# Patient Record
Sex: Female | Born: 1980
Health system: Southern US, Community
[De-identification: ages and names within clinical notes are randomized; demographics above are authoritative.]

## PROBLEM LIST (undated history)

## (undated) DIAGNOSIS — E119 Type 2 diabetes mellitus without complications: Secondary | ICD-10-CM

## (undated) DIAGNOSIS — H409 Unspecified glaucoma: Secondary | ICD-10-CM

## (undated) DIAGNOSIS — N186 End stage renal disease: Secondary | ICD-10-CM

## (undated) DIAGNOSIS — E785 Hyperlipidemia, unspecified: Secondary | ICD-10-CM

## (undated) DIAGNOSIS — F32A Depression, unspecified: Secondary | ICD-10-CM

## (undated) DIAGNOSIS — D649 Anemia, unspecified: Secondary | ICD-10-CM

## (undated) DIAGNOSIS — F419 Anxiety disorder, unspecified: Secondary | ICD-10-CM

## (undated) DIAGNOSIS — I10 Essential (primary) hypertension: Secondary | ICD-10-CM

## (undated) HISTORY — PX: OTHER SURGICAL HISTORY: SHX169

## (undated) HISTORY — DX: Unspecified glaucoma: H40.9

## (undated) HISTORY — DX: Type 2 diabetes mellitus without complications: E11.9

## (undated) HISTORY — PX: ANKLE SURGERY: SHX546

## (undated) HISTORY — PX: FOOT SURGERY: SHX648

## (undated) HISTORY — DX: Essential (primary) hypertension: I10

## (undated) HISTORY — DX: Hyperlipidemia, unspecified: E78.5

---

## 2008-01-17 ENCOUNTER — Ambulatory Visit (HOSPITAL_COMMUNITY): Admission: RE | Admit: 2008-01-17 | Discharge: 2008-01-17 | Payer: Self-pay | Admitting: Obstetrics and Gynecology

## 2008-02-16 ENCOUNTER — Ambulatory Visit (HOSPITAL_COMMUNITY): Admission: RE | Admit: 2008-02-16 | Discharge: 2008-02-16 | Payer: Self-pay | Admitting: Obstetrics and Gynecology

## 2010-03-22 ENCOUNTER — Encounter: Payer: Self-pay | Admitting: Obstetrics and Gynecology

## 2013-05-22 ENCOUNTER — Other Ambulatory Visit (HOSPITAL_COMMUNITY): Payer: Self-pay | Admitting: Podiatry

## 2013-05-22 DIAGNOSIS — M79606 Pain in leg, unspecified: Secondary | ICD-10-CM

## 2013-05-22 DIAGNOSIS — S92902A Unspecified fracture of left foot, initial encounter for closed fracture: Secondary | ICD-10-CM

## 2013-05-23 ENCOUNTER — Ambulatory Visit (HOSPITAL_COMMUNITY)
Admission: RE | Admit: 2013-05-23 | Discharge: 2013-05-23 | Disposition: A | Discharge status: Home / Self Care | Payer: BC Managed Care – PPO | Source: Ambulatory Visit | Attending: Podiatry | Admitting: Podiatry

## 2013-05-23 DIAGNOSIS — M79609 Pain in unspecified limb: Secondary | ICD-10-CM | POA: Insufficient documentation

## 2013-05-23 DIAGNOSIS — M79606 Pain in leg, unspecified: Secondary | ICD-10-CM

## 2013-05-23 DIAGNOSIS — M7989 Other specified soft tissue disorders: Secondary | ICD-10-CM | POA: Insufficient documentation

## 2013-05-28 ENCOUNTER — Encounter (HOSPITAL_COMMUNITY)
Admission: RE | Admit: 2013-05-28 | Discharge: 2013-05-28 | Disposition: A | Discharge status: Home / Self Care | Payer: BC Managed Care – PPO | Source: Ambulatory Visit | Attending: Podiatry | Admitting: Podiatry

## 2013-05-28 ENCOUNTER — Encounter (HOSPITAL_COMMUNITY): Payer: Self-pay

## 2013-05-28 DIAGNOSIS — S92902A Unspecified fracture of left foot, initial encounter for closed fracture: Secondary | ICD-10-CM

## 2013-05-28 DIAGNOSIS — M7989 Other specified soft tissue disorders: Secondary | ICD-10-CM | POA: Insufficient documentation

## 2013-05-28 DIAGNOSIS — M79609 Pain in unspecified limb: Secondary | ICD-10-CM | POA: Insufficient documentation

## 2013-05-28 MED ORDER — TECHNETIUM TC 99M MEDRONATE IV KIT
25.0000 | PACK | Freq: Once | INTRAVENOUS | Status: AC | PRN
  Administered 2013-05-28: 25 via INTRAVENOUS

## 2013-05-29 ENCOUNTER — Other Ambulatory Visit (HOSPITAL_COMMUNITY): Payer: Self-pay | Admitting: Podiatry

## 2013-05-29 DIAGNOSIS — M7989 Other specified soft tissue disorders: Secondary | ICD-10-CM

## 2013-05-31 ENCOUNTER — Ambulatory Visit (HOSPITAL_COMMUNITY)
Admission: RE | Admit: 2013-05-31 | Discharge: 2013-05-31 | Disposition: A | Discharge status: Home / Self Care | Payer: BC Managed Care – PPO | Source: Ambulatory Visit | Attending: Podiatry | Admitting: Podiatry

## 2013-05-31 DIAGNOSIS — M7989 Other specified soft tissue disorders: Secondary | ICD-10-CM

## 2013-05-31 DIAGNOSIS — S92309A Fracture of unspecified metatarsal bone(s), unspecified foot, initial encounter for closed fracture: Secondary | ICD-10-CM | POA: Insufficient documentation

## 2013-05-31 DIAGNOSIS — X58XXXA Exposure to other specified factors, initial encounter: Secondary | ICD-10-CM | POA: Insufficient documentation

## 2013-05-31 DIAGNOSIS — R937 Abnormal findings on diagnostic imaging of other parts of musculoskeletal system: Secondary | ICD-10-CM | POA: Insufficient documentation

## 2013-06-07 ENCOUNTER — Other Ambulatory Visit: Payer: Self-pay | Admitting: Surgery

## 2013-06-07 DIAGNOSIS — M7989 Other specified soft tissue disorders: Secondary | ICD-10-CM

## 2013-06-07 DIAGNOSIS — M79609 Pain in unspecified limb: Secondary | ICD-10-CM

## 2013-06-13 ENCOUNTER — Encounter: Payer: Self-pay | Admitting: Surgery

## 2013-07-06 ENCOUNTER — Encounter: Payer: Self-pay | Admitting: Surgery

## 2013-07-09 ENCOUNTER — Encounter (HOSPITAL_COMMUNITY): Payer: BC Managed Care – PPO

## 2013-07-09 ENCOUNTER — Encounter: Payer: BC Managed Care – PPO | Admitting: Surgery

## 2013-08-13 ENCOUNTER — Encounter: Payer: Self-pay | Admitting: Physical Medicine & Rehabilitation

## 2013-10-19 ENCOUNTER — Ambulatory Visit: Payer: BC Managed Care – PPO | Admitting: Physical Medicine & Rehabilitation

## 2014-03-15 DIAGNOSIS — E11610 Type 2 diabetes mellitus with diabetic neuropathic arthropathy: Secondary | ICD-10-CM | POA: Insufficient documentation

## 2014-12-24 ENCOUNTER — Ambulatory Visit: Payer: BLUE CROSS/BLUE SHIELD | Attending: Podiatry | Admitting: Physical Therapy

## 2014-12-24 DIAGNOSIS — R29898 Other symptoms and signs involving the musculoskeletal system: Secondary | ICD-10-CM | POA: Diagnosis not present

## 2014-12-24 NOTE — Therapy (Signed)
[facility] [place], [place], [address] Phone: [phone]   Fax:  [phone]  Physical Therapy Evaluation  Patient Details  Name: Sally Porter MRN: [id number] Date of Birth: 1980/11/12 Referring Provider: Erline Hau MD.  Encounter Date: 12/24/2014    Past Medical History  Diagnosis Date  . Diabetes mellitus without complication   . Glaucoma     No past surgical history on file.  There were no vitals filed for this visit.  Visit Diagnosis:  Weakness of both lower extremities - Plan: PT plan of care cert/re-cert      Subjective Assessment - 12/24/14 1246    Subjective Doctor wants me to get stronger and walk with my walker.   Patient Stated Goals To be independent again.            [facility] PT Assessment - 12/24/14 0001    Assessment   Medical Diagnosis LE weakness.   Referring Provider Erline Hau MD.   Onset Date/Surgical Date --  April 22, 2013.   Precautions   Precaution Comments Patient wears bilateral boots and is only allowed to weight bear (with W) with them donned.   Restrictions   Weight Bearing Restrictions --  WBAT with walker with bilateral boots donned.   Balance Screen   Has the patient fallen in the past 6 months No   Has the patient had a decrease in activity level because of a fear of falling?  Yes   Is the patient reluctant to leave their home because of a fear of falling?  No   Observation/Other Assessments   Observations When patient donned bilateral boots she had significant edema over bilateral ankles and feet (could be observed through socks).     ROM / Strength   AROM / PROM / Strength AROM;Strength   AROM   Overall AROM Comments As patient mentioned in the history intake she has very little ankle movement bilaterally (no more than 10 degrees moving into dorsi/plantarflexion).   Strength   Overall Strength Comments Bilateral hip flexion and abduction and knee  flexipon and extension= 4- to 4/5.   Ambulation/Gait   Gait Comments The patient ambulated 25 feet in clinic today with a FWW with bilateral boots donned and CGA.                             PT Short Term Goals - 12/24/14 1249    PT SHORT TERM GOAL #1   Title Ind with an HEP.   Time 3   Period Weeks   Status New           PT Long Term Goals - 12/24/14 1250    PT LONG TERM GOAL #1   Title Patient walk in clinic with a FWW 1,000 feet.   Time 8   Status New               Plan - 12/24/14 0905    Clinical Impression Statement On April 22, 2013 was at work and rolled over left ankle.  To ED for X-ray.  Left ankle swollen so and they thought I had a bloodclot.  Got a CT scan scan and found out I had a fracture foot.  Bone stimulator was working as expected so in November patient had an external fixator put on and wore fixator until January 2016.  Then second surgery to fuse left foot.  Then in April 2016 was on walker  and hopping on right foot and  right ankle fractured. and then had to have an ORIF on right ankle and also had external fixator on left ankle removed in April as well. Both feet/ankles were casted at that time.  In wheelchair at this time.  Resting pain-level is a 5-6/10 and with standing pain rises to a 7-8/10.  Patient has has low back pain due to all of this and is seeing pain mangement MD..  When out of her boots she moves her ankles though her motion is very limited.   Pt will benefit from skilled therapeutic intervention in order to improve on the following deficits Pain;Decreased activity tolerance   Rehab Potential Good   PT Frequency 2x / week   PT Duration 8 weeks   PT Treatment/Interventions Therapeutic exercise   Consulted and Agree with Plan of Care Patient         Problem List There are no active problems to display for this patient.   Camdan Burdi, [place] MPT 12/24/2014, 12:59 PM  [facility] [address] [place], [place], [address] Phone: [phone]   Fax:  [phone]  Name: Sally Porter MRN: [id number] Date of Birth: 1980/04/22

## 2014-12-26 ENCOUNTER — Ambulatory Visit: Payer: BLUE CROSS/BLUE SHIELD | Admitting: Physical Therapy

## 2014-12-26 DIAGNOSIS — R29898 Other symptoms and signs involving the musculoskeletal system: Secondary | ICD-10-CM

## 2014-12-26 NOTE — Therapy (Signed)
[facility] [place], [place], [address] Phone: [phone]   Fax:  [phone]  Physical Therapy Treatment  Patient Details  Name: Sally Porter MRN: [id number] Date of Birth: Jul 13, 1980 Referring Provider: Erline Hau MD.  Encounter Date: 12/26/2014      PT End of Session - 12/26/14 1730    Visit Number 2   Number of Visits 16   Date for PT Re-Evaluation 02/20/15   PT Start Time 0446   PT Stop Time 0523   PT Time Calculation (min) 37 min   Activity Tolerance Patient tolerated treatment well   Behavior During Therapy [facility] for tasks assessed/performed      Past Medical History  Diagnosis Date  . Diabetes mellitus without complication   . Glaucoma     No past surgical history on file.  There were no vitals filed for this visit.  Visit Diagnosis:  Weakness of both lower extremities                                 PT Short Term Goals - 12/24/14 1249    PT SHORT TERM GOAL #1   Title Ind with an HEP.   Time 3   Period Weeks   Status New           PT Long Term Goals - 12/24/14 1250    PT LONG TERM GOAL #1   Title Patient walk in clinic with a FWW 1,000 feet.   Time 8   Status New     Treatment:  Nustep level 4 x 20 minutes gait with FWW 100 feet.  Sent letter to MD regarding possible ROM/STR of bil ankle out of boots in [place] fashion.  Patient states she would bring letter back her next visit.          Problem List There are no active problems to display for this patient.   APPLEGATE, [place] MPT 12/26/2014, 5:43 PM  [facility] [address] [place], [place], [address] Phone: [phone]   Fax:  [phone]  Name: Sally Porter MRN: [id number] Date of Birth: [birth year]

## 2014-12-30 ENCOUNTER — Encounter: Payer: BLUE CROSS/BLUE SHIELD | Admitting: Physical Therapy

## 2015-01-02 ENCOUNTER — Encounter: Payer: BLUE CROSS/BLUE SHIELD | Admitting: Physical Therapy

## 2015-01-07 ENCOUNTER — Encounter: Payer: BLUE CROSS/BLUE SHIELD | Admitting: Physical Therapy

## 2015-01-09 ENCOUNTER — Encounter: Payer: BLUE CROSS/BLUE SHIELD | Admitting: Physical Therapy

## 2015-02-20 IMAGING — US US EXTREM LOW VENOUS*L*
1 series · 14 of 24 positions shown · non-contrast
Comparison: None.

CLINICAL DATA: Left leg pain and swelling.

EXAM:
Left LOWER EXTREMITY VENOUS DOPPLER ULTRASOUND
TECHNIQUE: Gray-scale sonography with graded compression, as well as color
Doppler and duplex ultrasound, were performed to evaluate the deep
venous system from the level of the common femoral vein through the
popliteal and proximal calf veins. Spectral Doppler was utilized to
evaluate flow at rest and with distal augmentation maneuvers.

[Series 1: us extrem low venous*left* · 0.09mm/px · 14 of 40 slices shown]
[im 1/40]
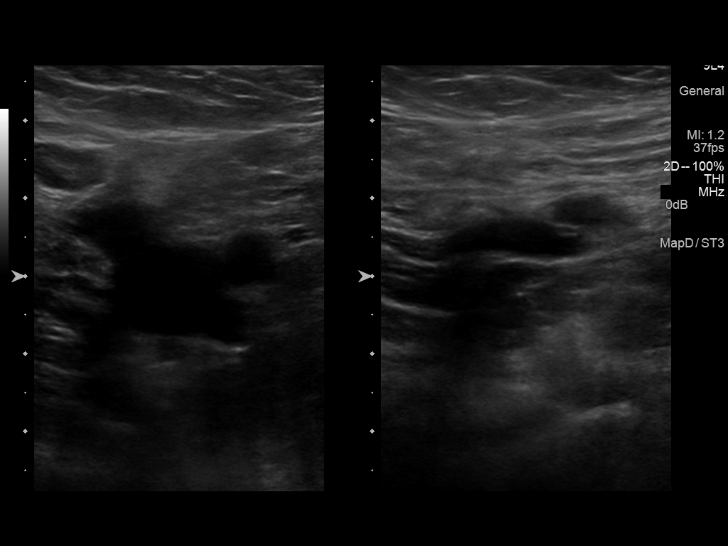
[im 4/40]
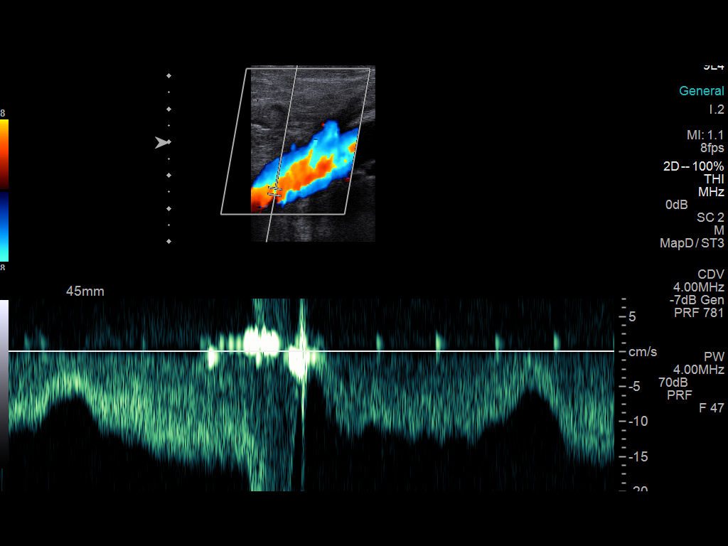
[im 7/40]
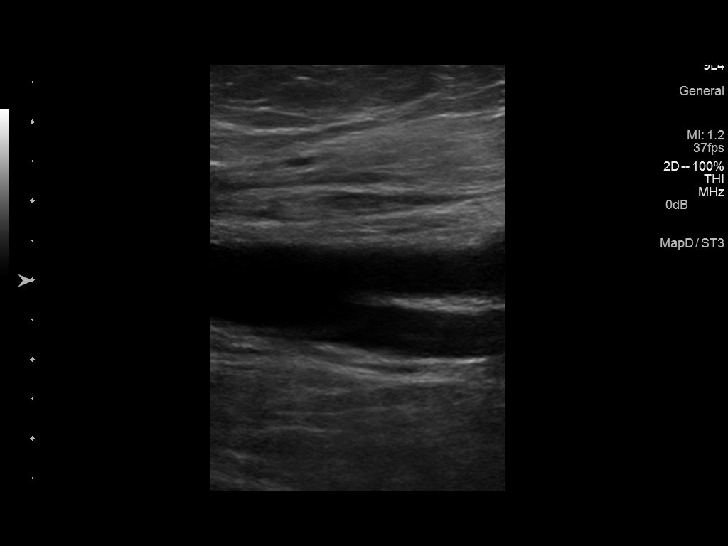
[im 11/40]
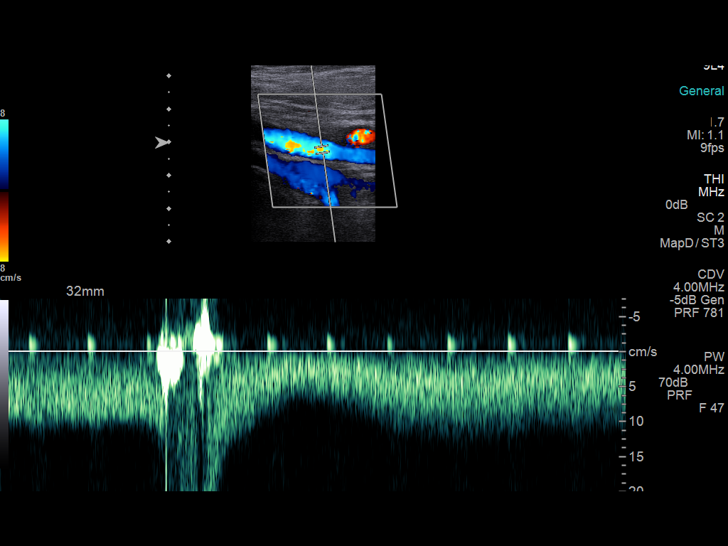
[im 12/40]
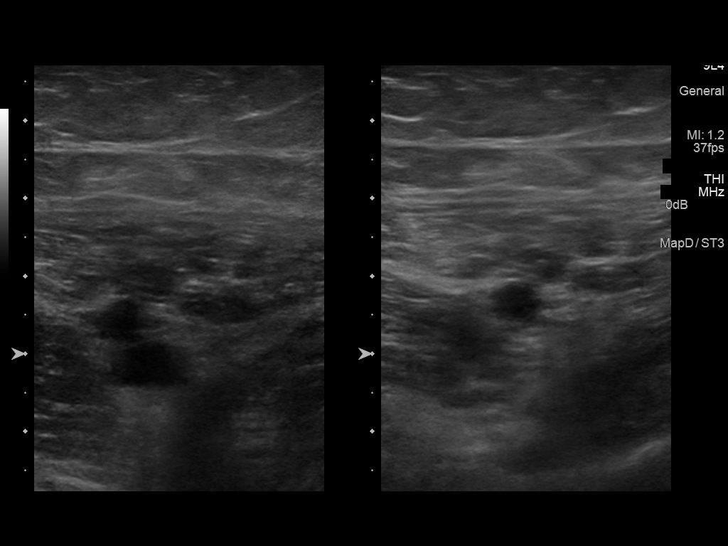
[im 16/40]
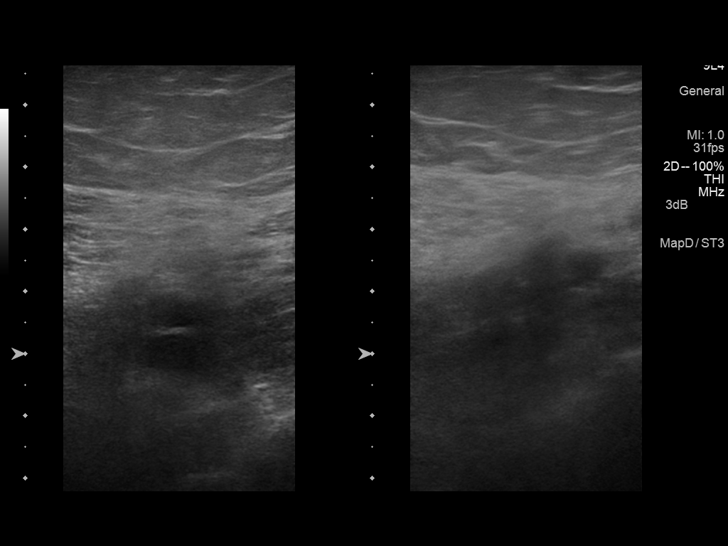
[im 19/40]
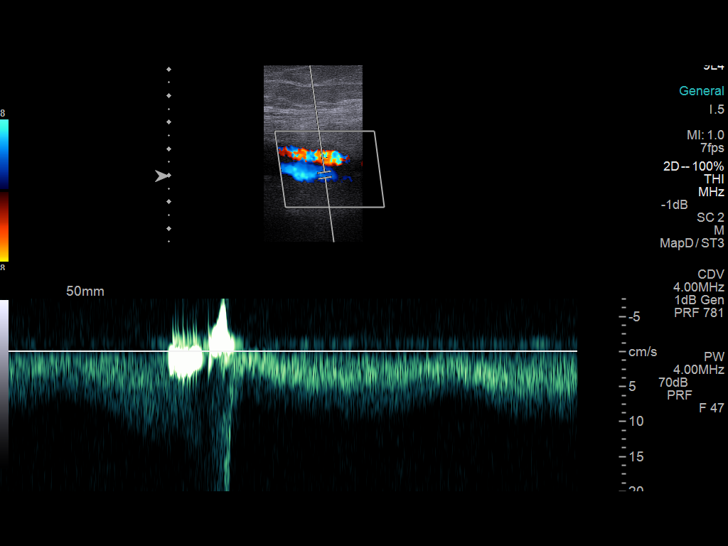
[im 21/40]
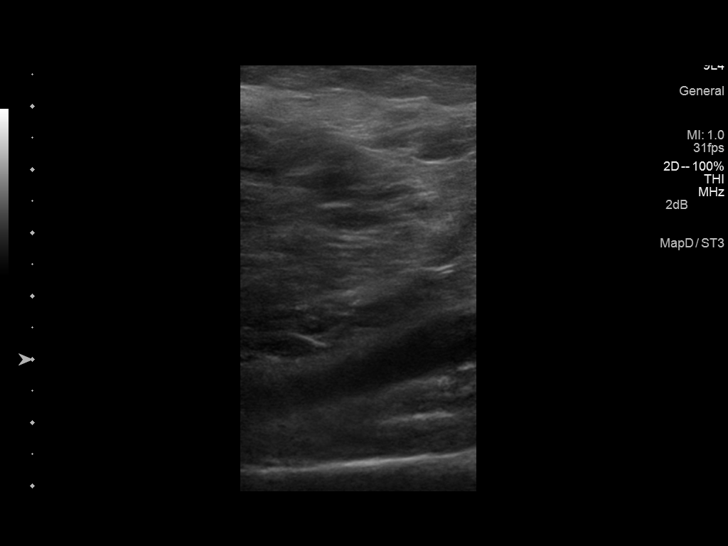
[im 24/40]
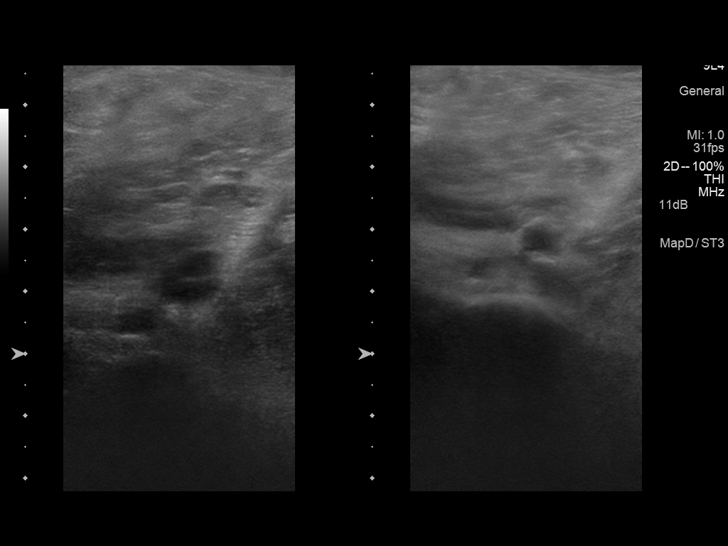
[im 28/40]
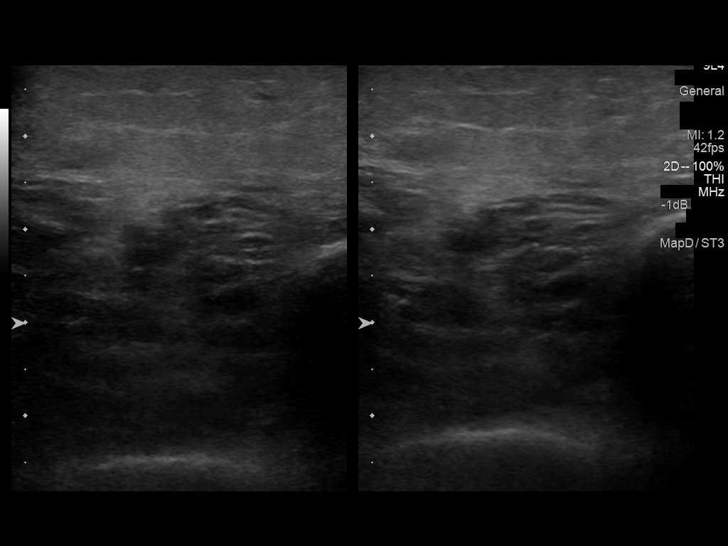
[im 31/40]
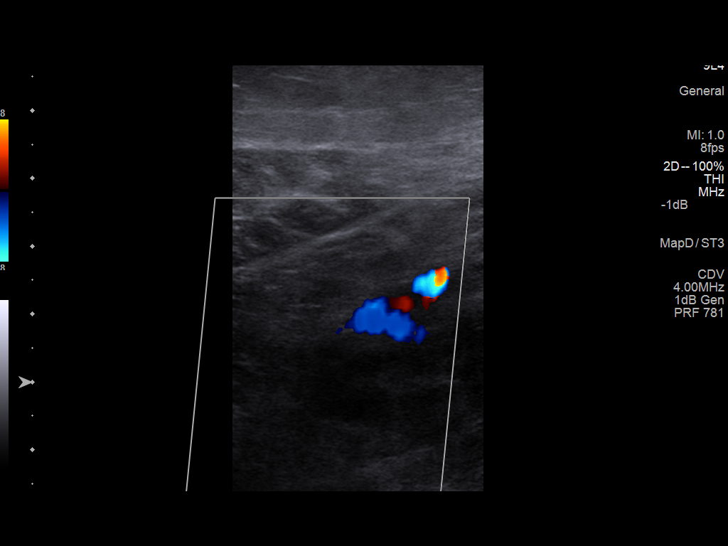
[im 33/40]
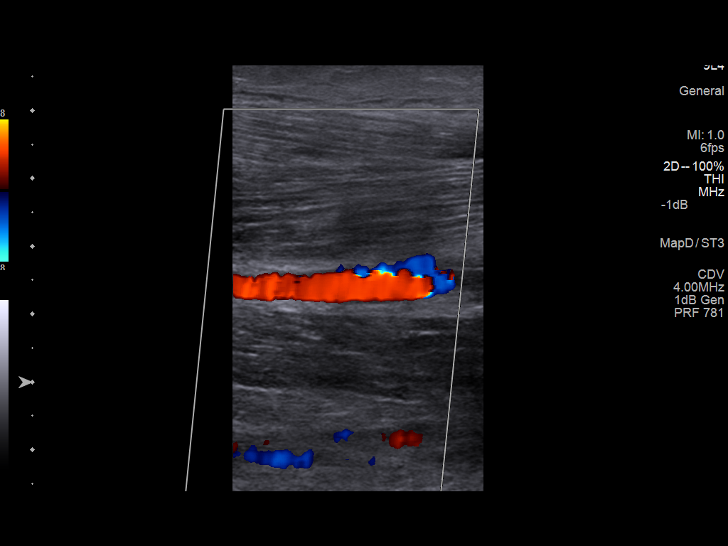
[im 36/40]
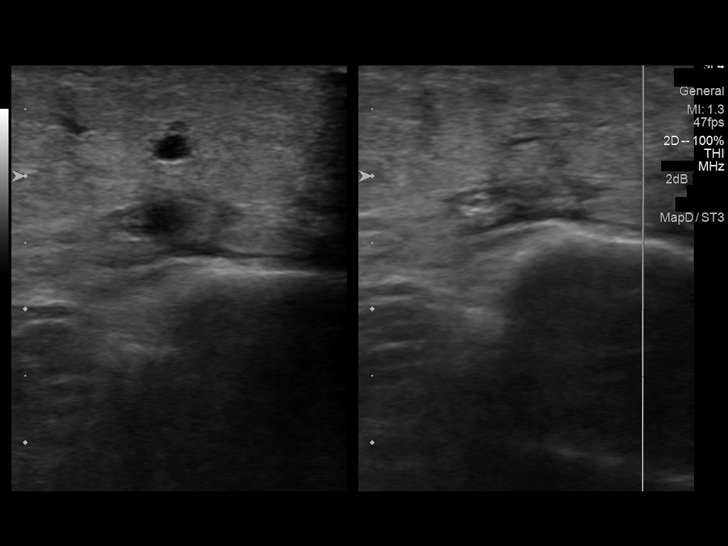
[im 40/40]
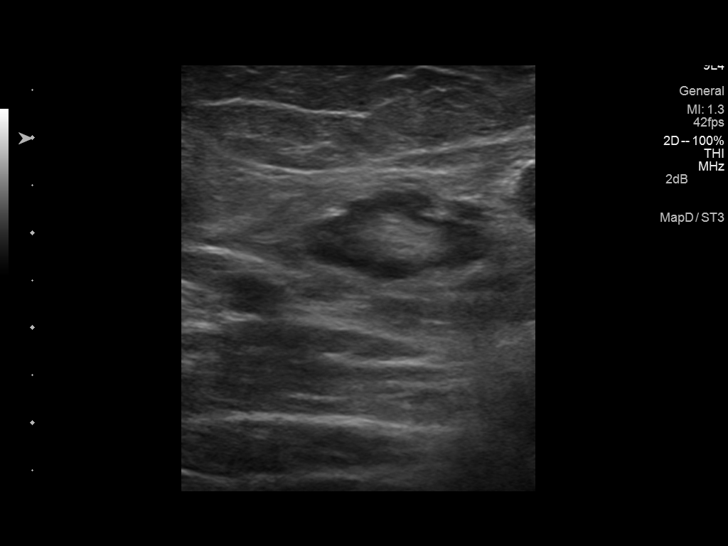

[14 of 24 positions shown; findings below may reference images not displayed]

FINDINGS: Thrombus within deep veins:  None visualized.

Compressibility of deep veins:  Normal.

Duplex waveform respiratory phasicity:  Normal.

Duplex waveform response to augmentation:  Normal.

Venous reflux:  None visualized.

Other findings: Inguinal lymphadenopathy is suspected. Correlate
with physical exam. CT abdomen and pelvis could be confirmatory.
Mild soft tissue edema in the calf.
IMPRESSION: No evidence for deep venous thrombosis.

Suspected inguinal adenopathy.

## 2015-02-28 IMAGING — CT CT FOOT*L* W/O CM
2 of 6 series · 9 of 20 positions shown · non-contrast
Comparison: NM BONE SCAN 3 PHASE LOWER EXTREMITY dated 05/28/2013;
DG ANKLE 2V *L* dated 04/22/2013

CLINICAL DATA: Abnormality at the Lisfranc joint on bone scan.

EXAM:
CT OF THE LEFT FOOT WITHOUT CONTRAST
TECHNIQUE: Multidetector CT imaging was performed according to the standard
protocol. Multiplanar CT image reconstructions were also generated.

[Series 2: foot bone axial 1.5 recon at 2mm · coronal · 0.25mm/px · 6 of 178 slices shown]
[im 54/178  bone]
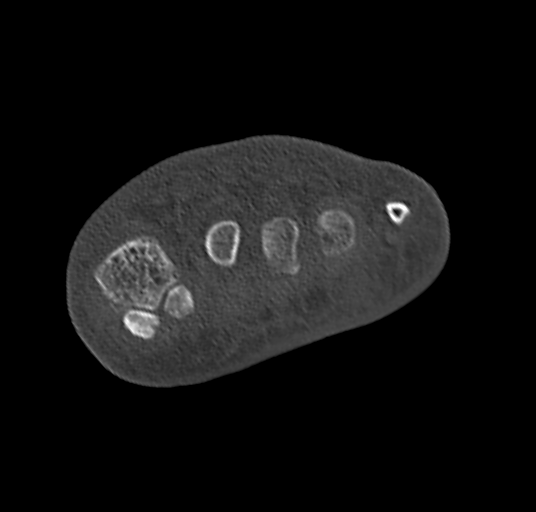
[im 60/178  bone]
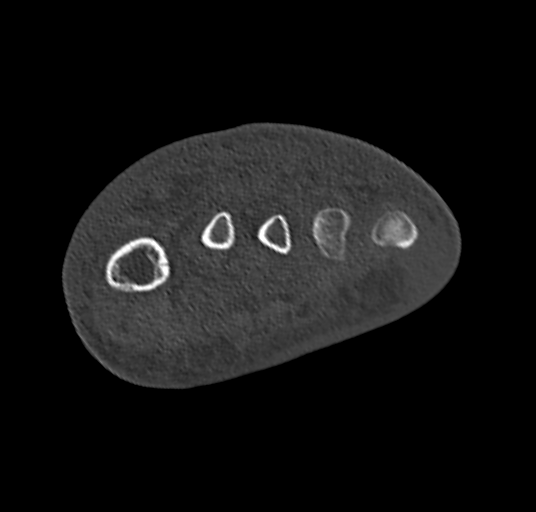
[im 90/178  bone]
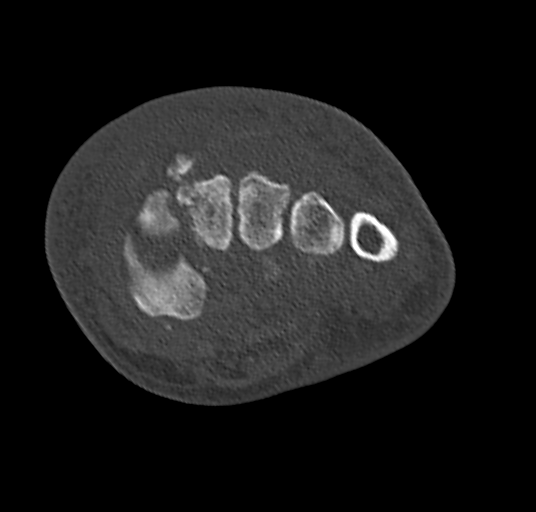
[im 119/178  bone]
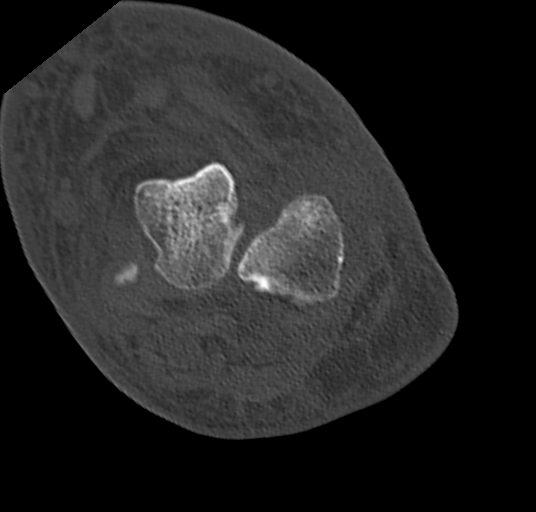
[im 126/178  bone]
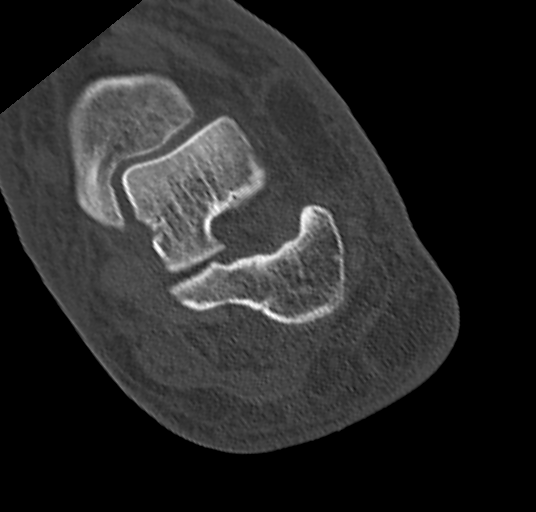
[im 163/178  bone]
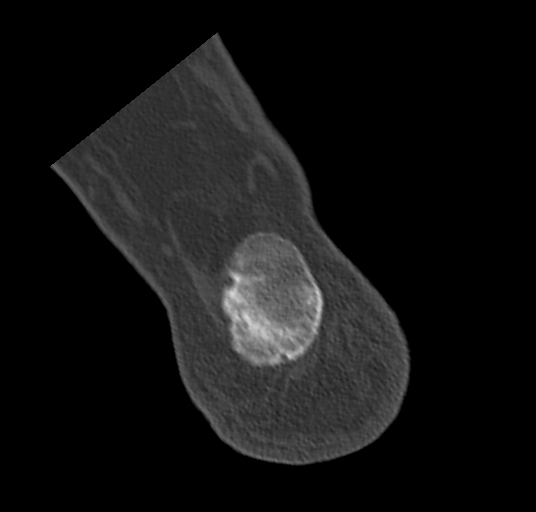

[Series 5: foot st axial 2.0 · coronal · 0.26mm/px · 3 of 127 slices shown]
[im 20/127  bone]
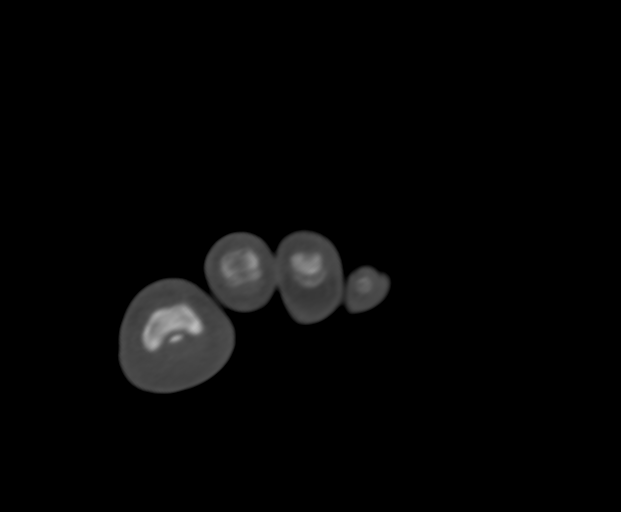
[im 64/127  bone]
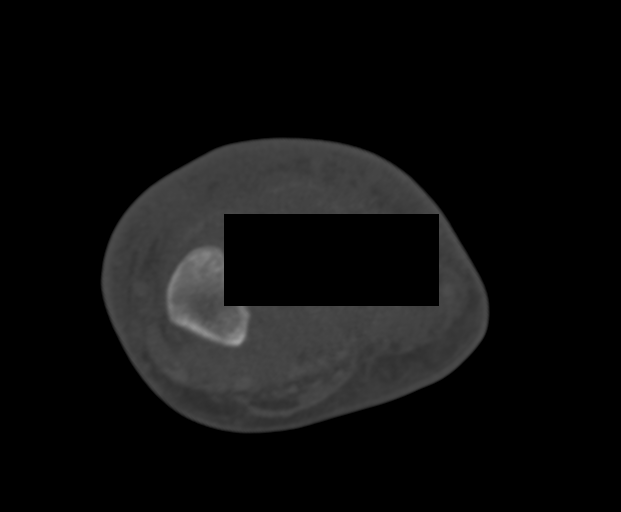
[im 92/127  bone]
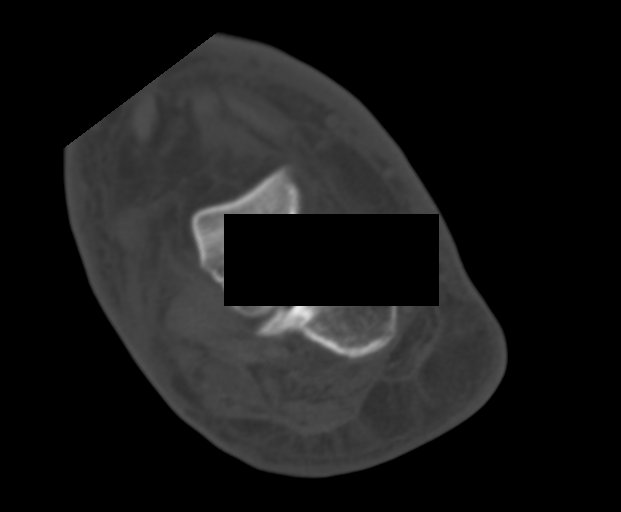

[9 of 20 positions shown; findings below may reference images not displayed]

FINDINGS: Severe and extensive fracturing, fragmentation, and dislocation at
the Lisfranc joint.

This includes a longitudinal transverse fracture plane through the
center of the medial cuneiform with considerable associated
fragmentation; collapse and irregular fragmentation of the middle
and lateral cuneiform; subcortical fracturing anteriorly in the
cuboid ; dorsal subluxation of the second through fifth metatarsals
with respect to the tarsals ; apparent medial subluxation of the
second through fifth metatarsals; and fracturing of the bases of the
second, third, and fourth metatarsals. Scattered fragments are
present in the midfoot.

The articulation between the cuboid and navicular is somewhat
irregular, with possible mild bony fragments in this region. There
is likely some mild impaction along the distal -lateral surface of
the navicular as on image 40 of series 3.

No flexor tendon entrapment observed. Extensive subcutaneous edema
in the foot and tracking back into the ankle.
IMPRESSION: 1. Extensive fragmentation associated with Lisfranc fracture
dislocation.

## 2015-04-10 NOTE — Therapy (Signed)
[facility] [place], [place], [address] Phone: [phone]   Fax:  [phone]  Physical Therapy Treatment  Patient Details  Name: Sally Porter MRN: [id number] Date of Birth: 13-May-1980 Referring Provider: Erline Hau MD.  Encounter Date: 12/26/2014    Past Medical History  Diagnosis Date  . Diabetes mellitus without complication   . Glaucoma     No past surgical history on file.  There were no vitals filed for this visit.  Visit Diagnosis:  Weakness of both lower extremities                                 PT Short Term Goals - 12/24/14 1249    PT SHORT TERM GOAL #1   Title Ind with an HEP.   Time 3   Period Weeks   Status New           PT Long Term Goals - 12/24/14 1250    PT LONG TERM GOAL #1   Title Patient walk in clinic with a FWW 1,000 feet.   Time 8   Status New               Problem List There are no active problems to display for this patient. PHYSICAL THERAPY DISCHARGE SUMMARY  Visits from Start of Care: 2  Current functional level related to goals / functional outcomes: Please see above.   Remaining deficits: No goals met.   Education / Equipment:  Plan: Patient agrees to discharge.  Patient goals were not met. Patient is being discharged due to meeting the stated rehab goals.  ?????       Kadarrius Yanke, [place] MPT 04/10/2015, 2:36 PM  [facility] [address] [place], [place], [address] Phone: [phone]   Fax:  [phone]  Name: Sally Porter MRN: [id number] Date of Birth: 01-Jan-1981

## 2015-11-02 ENCOUNTER — Other Ambulatory Visit (HOSPITAL_COMMUNITY)
Admission: RE | Admit: 2015-11-02 | Discharge: 2015-11-02 | Disposition: A | Discharge status: Home / Self Care | Payer: Medicaid Other | Source: Other Acute Inpatient Hospital | Attending: Infectious Diseases | Admitting: Infectious Diseases

## 2015-11-02 DIAGNOSIS — Z029 Encounter for administrative examinations, unspecified: Secondary | ICD-10-CM | POA: Insufficient documentation

## 2015-11-02 LAB — COMPREHENSIVE METABOLIC PANEL
ALT: 15 U/L (ref 14–54)
AST: 16 U/L (ref 15–41)
Albumin: 2.9 g/dL — ABNORMAL LOW (ref 3.5–5.0)
Alkaline Phosphatase: 73 U/L (ref 38–126)
Anion gap: 7 (ref 5–15)
BUN: 37 mg/dL — ABNORMAL HIGH (ref 6–20)
CO2: 25 mmol/L (ref 22–32)
Calcium: 8.9 mg/dL (ref 8.9–10.3)
Chloride: 103 mmol/L (ref 101–111)
Creatinine, Ser: 2.2 mg/dL — ABNORMAL HIGH (ref 0.44–1.00)
GFR calc Af Amer: 32 mL/min — ABNORMAL LOW (ref >60)
GFR calc non Af Amer: 28 mL/min — ABNORMAL LOW (ref >60)
Glucose, Bld: 144 mg/dL — ABNORMAL HIGH (ref 65–99)
Potassium: 4.5 mmol/L (ref 3.5–5.1)
Sodium: 135 mmol/L (ref 135–145)
Total Bilirubin: 0.3 mg/dL (ref 0.3–1.2)
Total Protein: 6.7 g/dL (ref 6.5–8.1)

## 2015-11-02 LAB — CBC WITH DIFFERENTIAL/PLATELET
Basophils Absolute: 0 10*3/uL (ref 0.0–0.1)
Basophils Relative: 0 %
Eosinophils Absolute: 0.4 10*3/uL (ref 0.0–0.7)
Eosinophils Relative: 4 %
HCT: 26.9 % — ABNORMAL LOW (ref 36.0–46.0)
Hemoglobin: 8.3 g/dL — ABNORMAL LOW (ref 12.0–15.0)
Lymphocytes Relative: 44 %
Lymphs Abs: 3.9 10*3/uL (ref 0.7–4.0)
MCH: 24.1 pg — ABNORMAL LOW (ref 26.0–34.0)
MCHC: 30.9 g/dL (ref 30.0–36.0)
MCV: 78 fL (ref 78.0–100.0)
Monocytes Absolute: 0.4 10*3/uL (ref 0.1–1.0)
Monocytes Relative: 4 %
Neutro Abs: 4.2 10*3/uL (ref 1.7–7.7)
Neutrophils Relative %: 48 %
Platelets: 337 10*3/uL (ref 150–400)
RBC: 3.45 MIL/uL — ABNORMAL LOW (ref 3.87–5.11)
RDW: 15.2 % (ref 11.5–15.5)
WBC: 9 10*3/uL (ref 4.0–10.5)

## 2015-11-02 LAB — VANCOMYCIN, TROUGH: Vancomycin Tr: 9 ug/mL — ABNORMAL LOW (ref 15–20)

## 2015-11-02 LAB — C-REACTIVE PROTEIN: CRP: 1.2 mg/dL — ABNORMAL HIGH (ref <1.0)

## 2015-11-02 LAB — SEDIMENTATION RATE: Sed Rate: 66 mm/hr — ABNORMAL HIGH (ref 0–22)

## 2016-07-08 DIAGNOSIS — M79672 Pain in left foot: Secondary | ICD-10-CM | POA: Diagnosis not present

## 2016-07-08 DIAGNOSIS — E1040 Type 1 diabetes mellitus with diabetic neuropathy, unspecified: Secondary | ICD-10-CM | POA: Diagnosis not present

## 2016-07-08 DIAGNOSIS — E11610 Type 2 diabetes mellitus with diabetic neuropathic arthropathy: Secondary | ICD-10-CM | POA: Diagnosis not present

## 2016-07-08 DIAGNOSIS — Z79891 Long term (current) use of opiate analgesic: Secondary | ICD-10-CM | POA: Diagnosis not present

## 2016-07-08 DIAGNOSIS — M25519 Pain in unspecified shoulder: Secondary | ICD-10-CM | POA: Diagnosis not present

## 2016-07-08 DIAGNOSIS — M545 Low back pain: Secondary | ICD-10-CM | POA: Diagnosis not present

## 2016-07-14 DIAGNOSIS — E1165 Type 2 diabetes mellitus with hyperglycemia: Secondary | ICD-10-CM | POA: Diagnosis not present

## 2016-07-14 DIAGNOSIS — Z79899 Other long term (current) drug therapy: Secondary | ICD-10-CM | POA: Diagnosis not present

## 2016-07-14 DIAGNOSIS — F329 Major depressive disorder, single episode, unspecified: Secondary | ICD-10-CM | POA: Diagnosis not present

## 2016-07-14 DIAGNOSIS — I129 Hypertensive chronic kidney disease with stage 1 through stage 4 chronic kidney disease, or unspecified chronic kidney disease: Secondary | ICD-10-CM | POA: Diagnosis not present

## 2016-07-14 DIAGNOSIS — Z794 Long term (current) use of insulin: Secondary | ICD-10-CM | POA: Diagnosis not present

## 2016-07-14 DIAGNOSIS — Z885 Allergy status to narcotic agent status: Secondary | ICD-10-CM | POA: Diagnosis not present

## 2016-07-14 DIAGNOSIS — E1122 Type 2 diabetes mellitus with diabetic chronic kidney disease: Secondary | ICD-10-CM | POA: Diagnosis not present

## 2016-07-14 DIAGNOSIS — T8484XA Pain due to internal orthopedic prosthetic devices, implants and grafts, initial encounter: Secondary | ICD-10-CM | POA: Diagnosis not present

## 2016-07-14 DIAGNOSIS — E11610 Type 2 diabetes mellitus with diabetic neuropathic arthropathy: Secondary | ICD-10-CM | POA: Diagnosis not present

## 2016-07-14 DIAGNOSIS — N183 Chronic kidney disease, stage 3 (moderate): Secondary | ICD-10-CM | POA: Diagnosis not present

## 2016-07-14 DIAGNOSIS — E785 Hyperlipidemia, unspecified: Secondary | ICD-10-CM | POA: Diagnosis not present

## 2016-07-14 DIAGNOSIS — J42 Unspecified chronic bronchitis: Secondary | ICD-10-CM | POA: Diagnosis not present

## 2016-07-14 DIAGNOSIS — Z87891 Personal history of nicotine dependence: Secondary | ICD-10-CM | POA: Diagnosis not present

## 2016-07-31 DIAGNOSIS — G44219 Episodic tension-type headache, not intractable: Secondary | ICD-10-CM | POA: Diagnosis not present

## 2016-07-31 DIAGNOSIS — H40033 Anatomical narrow angle, bilateral: Secondary | ICD-10-CM | POA: Diagnosis not present

## 2016-08-03 DIAGNOSIS — E1040 Type 1 diabetes mellitus with diabetic neuropathy, unspecified: Secondary | ICD-10-CM | POA: Diagnosis not present

## 2016-08-03 DIAGNOSIS — M79672 Pain in left foot: Secondary | ICD-10-CM | POA: Diagnosis not present

## 2016-08-03 DIAGNOSIS — M545 Low back pain: Secondary | ICD-10-CM | POA: Diagnosis not present

## 2016-08-03 DIAGNOSIS — Z79891 Long term (current) use of opiate analgesic: Secondary | ICD-10-CM | POA: Diagnosis not present

## 2016-08-03 DIAGNOSIS — M25519 Pain in unspecified shoulder: Secondary | ICD-10-CM | POA: Diagnosis not present

## 2016-09-10 DIAGNOSIS — M79672 Pain in left foot: Secondary | ICD-10-CM | POA: Diagnosis not present

## 2016-09-10 DIAGNOSIS — Z79891 Long term (current) use of opiate analgesic: Secondary | ICD-10-CM | POA: Diagnosis not present

## 2016-09-10 DIAGNOSIS — M25519 Pain in unspecified shoulder: Secondary | ICD-10-CM | POA: Diagnosis not present

## 2016-09-10 DIAGNOSIS — E1040 Type 1 diabetes mellitus with diabetic neuropathy, unspecified: Secondary | ICD-10-CM | POA: Diagnosis not present

## 2016-09-10 DIAGNOSIS — M545 Low back pain: Secondary | ICD-10-CM | POA: Diagnosis not present

## 2016-09-16 DIAGNOSIS — Z4789 Encounter for other orthopedic aftercare: Secondary | ICD-10-CM | POA: Diagnosis not present

## 2016-09-16 DIAGNOSIS — E11610 Type 2 diabetes mellitus with diabetic neuropathic arthropathy: Secondary | ICD-10-CM | POA: Diagnosis not present

## 2016-09-30 DIAGNOSIS — E11610 Type 2 diabetes mellitus with diabetic neuropathic arthropathy: Secondary | ICD-10-CM | POA: Diagnosis not present

## 2016-09-30 DIAGNOSIS — Z4800 Encounter for change or removal of nonsurgical wound dressing: Secondary | ICD-10-CM | POA: Diagnosis not present

## 2016-10-14 DIAGNOSIS — Z4800 Encounter for change or removal of nonsurgical wound dressing: Secondary | ICD-10-CM | POA: Diagnosis not present

## 2016-11-08 DIAGNOSIS — M79672 Pain in left foot: Secondary | ICD-10-CM | POA: Diagnosis not present

## 2016-11-08 DIAGNOSIS — Z79891 Long term (current) use of opiate analgesic: Secondary | ICD-10-CM | POA: Diagnosis not present

## 2016-11-08 DIAGNOSIS — E1040 Type 1 diabetes mellitus with diabetic neuropathy, unspecified: Secondary | ICD-10-CM | POA: Diagnosis not present

## 2016-11-08 DIAGNOSIS — M25519 Pain in unspecified shoulder: Secondary | ICD-10-CM | POA: Diagnosis not present

## 2016-11-08 DIAGNOSIS — M545 Low back pain: Secondary | ICD-10-CM | POA: Diagnosis not present

## 2016-11-09 DIAGNOSIS — E11610 Type 2 diabetes mellitus with diabetic neuropathic arthropathy: Secondary | ICD-10-CM | POA: Diagnosis not present

## 2016-11-25 DIAGNOSIS — Z4789 Encounter for other orthopedic aftercare: Secondary | ICD-10-CM | POA: Diagnosis not present

## 2016-11-25 DIAGNOSIS — E11610 Type 2 diabetes mellitus with diabetic neuropathic arthropathy: Secondary | ICD-10-CM | POA: Diagnosis not present

## 2016-11-25 DIAGNOSIS — I10 Essential (primary) hypertension: Secondary | ICD-10-CM | POA: Diagnosis not present

## 2017-03-04 DIAGNOSIS — M79672 Pain in left foot: Secondary | ICD-10-CM | POA: Diagnosis not present

## 2017-03-04 DIAGNOSIS — M545 Low back pain: Secondary | ICD-10-CM | POA: Diagnosis not present

## 2017-03-04 DIAGNOSIS — G4700 Insomnia, unspecified: Secondary | ICD-10-CM | POA: Diagnosis not present

## 2017-03-04 DIAGNOSIS — Z79891 Long term (current) use of opiate analgesic: Secondary | ICD-10-CM | POA: Diagnosis not present

## 2017-04-29 DIAGNOSIS — M545 Low back pain: Secondary | ICD-10-CM | POA: Diagnosis not present

## 2017-04-29 DIAGNOSIS — M79672 Pain in left foot: Secondary | ICD-10-CM | POA: Diagnosis not present

## 2017-04-29 DIAGNOSIS — Z79891 Long term (current) use of opiate analgesic: Secondary | ICD-10-CM | POA: Diagnosis not present

## 2017-04-29 DIAGNOSIS — M25511 Pain in right shoulder: Secondary | ICD-10-CM | POA: Diagnosis not present

## 2017-06-24 DIAGNOSIS — G4700 Insomnia, unspecified: Secondary | ICD-10-CM | POA: Diagnosis not present

## 2017-06-24 DIAGNOSIS — Z79891 Long term (current) use of opiate analgesic: Secondary | ICD-10-CM | POA: Diagnosis not present

## 2017-06-24 DIAGNOSIS — M545 Low back pain: Secondary | ICD-10-CM | POA: Diagnosis not present

## 2017-06-24 DIAGNOSIS — M79672 Pain in left foot: Secondary | ICD-10-CM | POA: Diagnosis not present

## 2017-07-03 DIAGNOSIS — I120 Hypertensive chronic kidney disease with stage 5 chronic kidney disease or end stage renal disease: Secondary | ICD-10-CM | POA: Diagnosis not present

## 2017-07-03 DIAGNOSIS — N185 Chronic kidney disease, stage 5: Secondary | ICD-10-CM | POA: Diagnosis not present

## 2017-07-03 DIAGNOSIS — E860 Dehydration: Secondary | ICD-10-CM | POA: Diagnosis not present

## 2017-07-03 DIAGNOSIS — L02215 Cutaneous abscess of perineum: Secondary | ICD-10-CM | POA: Diagnosis not present

## 2017-07-03 DIAGNOSIS — E108 Type 1 diabetes mellitus with unspecified complications: Secondary | ICD-10-CM | POA: Diagnosis not present

## 2017-07-03 DIAGNOSIS — R300 Dysuria: Secondary | ICD-10-CM | POA: Diagnosis not present

## 2017-07-03 DIAGNOSIS — R52 Pain, unspecified: Secondary | ICD-10-CM | POA: Diagnosis not present

## 2017-07-03 DIAGNOSIS — E1022 Type 1 diabetes mellitus with diabetic chronic kidney disease: Secondary | ICD-10-CM | POA: Diagnosis not present

## 2017-07-03 DIAGNOSIS — E871 Hypo-osmolality and hyponatremia: Secondary | ICD-10-CM | POA: Diagnosis not present

## 2017-07-03 DIAGNOSIS — N179 Acute kidney failure, unspecified: Secondary | ICD-10-CM | POA: Diagnosis not present

## 2017-07-03 DIAGNOSIS — L03315 Cellulitis of perineum: Secondary | ICD-10-CM | POA: Diagnosis not present

## 2017-07-04 DIAGNOSIS — Z833 Family history of diabetes mellitus: Secondary | ICD-10-CM | POA: Diagnosis not present

## 2017-07-04 DIAGNOSIS — E871 Hypo-osmolality and hyponatremia: Secondary | ICD-10-CM | POA: Diagnosis not present

## 2017-07-04 DIAGNOSIS — N185 Chronic kidney disease, stage 5: Secondary | ICD-10-CM | POA: Diagnosis present

## 2017-07-04 DIAGNOSIS — L02214 Cutaneous abscess of groin: Secondary | ICD-10-CM | POA: Diagnosis not present

## 2017-07-04 DIAGNOSIS — D631 Anemia in chronic kidney disease: Secondary | ICD-10-CM | POA: Diagnosis not present

## 2017-07-04 DIAGNOSIS — Z87891 Personal history of nicotine dependence: Secondary | ICD-10-CM | POA: Diagnosis not present

## 2017-07-04 DIAGNOSIS — N9089 Other specified noninflammatory disorders of vulva and perineum: Secondary | ICD-10-CM | POA: Diagnosis not present

## 2017-07-04 DIAGNOSIS — D649 Anemia, unspecified: Secondary | ICD-10-CM | POA: Diagnosis not present

## 2017-07-04 DIAGNOSIS — Z79891 Long term (current) use of opiate analgesic: Secondary | ICD-10-CM | POA: Diagnosis not present

## 2017-07-04 DIAGNOSIS — M25531 Pain in right wrist: Secondary | ICD-10-CM | POA: Diagnosis not present

## 2017-07-04 DIAGNOSIS — Z6837 Body mass index (BMI) 37.0-37.9, adult: Secondary | ICD-10-CM | POA: Diagnosis not present

## 2017-07-04 DIAGNOSIS — N179 Acute kidney failure, unspecified: Secondary | ICD-10-CM | POA: Diagnosis not present

## 2017-07-04 DIAGNOSIS — Z4801 Encounter for change or removal of surgical wound dressing: Secondary | ICD-10-CM | POA: Diagnosis not present

## 2017-07-04 DIAGNOSIS — E119 Type 2 diabetes mellitus without complications: Secondary | ICD-10-CM | POA: Diagnosis not present

## 2017-07-04 DIAGNOSIS — E1065 Type 1 diabetes mellitus with hyperglycemia: Secondary | ICD-10-CM | POA: Diagnosis present

## 2017-07-04 DIAGNOSIS — E109 Type 1 diabetes mellitus without complications: Secondary | ICD-10-CM | POA: Diagnosis not present

## 2017-07-04 DIAGNOSIS — E1122 Type 2 diabetes mellitus with diabetic chronic kidney disease: Secondary | ICD-10-CM | POA: Diagnosis not present

## 2017-07-04 DIAGNOSIS — N186 End stage renal disease: Secondary | ICD-10-CM | POA: Diagnosis not present

## 2017-07-04 DIAGNOSIS — E872 Acidosis: Secondary | ICD-10-CM | POA: Diagnosis not present

## 2017-07-04 DIAGNOSIS — E875 Hyperkalemia: Secondary | ICD-10-CM | POA: Diagnosis not present

## 2017-07-04 DIAGNOSIS — J45909 Unspecified asthma, uncomplicated: Secondary | ICD-10-CM | POA: Diagnosis not present

## 2017-07-04 DIAGNOSIS — Z823 Family history of stroke: Secondary | ICD-10-CM | POA: Diagnosis not present

## 2017-07-04 DIAGNOSIS — Z825 Family history of asthma and other chronic lower respiratory diseases: Secondary | ICD-10-CM | POA: Diagnosis not present

## 2017-07-04 DIAGNOSIS — L03314 Cellulitis of groin: Secondary | ICD-10-CM | POA: Diagnosis not present

## 2017-07-04 DIAGNOSIS — F419 Anxiety disorder, unspecified: Secondary | ICD-10-CM | POA: Diagnosis present

## 2017-07-04 DIAGNOSIS — I129 Hypertensive chronic kidney disease with stage 1 through stage 4 chronic kidney disease, or unspecified chronic kidney disease: Secondary | ICD-10-CM | POA: Diagnosis not present

## 2017-07-04 DIAGNOSIS — E1022 Type 1 diabetes mellitus with diabetic chronic kidney disease: Secondary | ICD-10-CM | POA: Diagnosis not present

## 2017-07-04 DIAGNOSIS — I120 Hypertensive chronic kidney disease with stage 5 chronic kidney disease or end stage renal disease: Secondary | ICD-10-CM | POA: Diagnosis not present

## 2017-07-04 DIAGNOSIS — Z794 Long term (current) use of insulin: Secondary | ICD-10-CM | POA: Diagnosis not present

## 2017-07-04 DIAGNOSIS — R7881 Bacteremia: Secondary | ICD-10-CM | POA: Diagnosis not present

## 2017-07-04 DIAGNOSIS — Z79899 Other long term (current) drug therapy: Secondary | ICD-10-CM | POA: Diagnosis not present

## 2017-07-04 DIAGNOSIS — I96 Gangrene, not elsewhere classified: Secondary | ICD-10-CM | POA: Diagnosis not present

## 2017-07-04 DIAGNOSIS — L03317 Cellulitis of buttock: Secondary | ICD-10-CM | POA: Diagnosis not present

## 2017-07-04 DIAGNOSIS — E7800 Pure hypercholesterolemia, unspecified: Secondary | ICD-10-CM | POA: Diagnosis not present

## 2017-07-04 DIAGNOSIS — I1310 Hypertensive heart and chronic kidney disease without heart failure, with stage 1 through stage 4 chronic kidney disease, or unspecified chronic kidney disease: Secondary | ICD-10-CM | POA: Diagnosis not present

## 2017-07-04 DIAGNOSIS — N184 Chronic kidney disease, stage 4 (severe): Secondary | ICD-10-CM | POA: Diagnosis not present

## 2017-07-04 DIAGNOSIS — Z885 Allergy status to narcotic agent status: Secondary | ICD-10-CM | POA: Diagnosis not present

## 2017-07-04 DIAGNOSIS — Z811 Family history of alcohol abuse and dependence: Secondary | ICD-10-CM | POA: Diagnosis not present

## 2017-07-04 DIAGNOSIS — Z820 Family history of epilepsy and other diseases of the nervous system: Secondary | ICD-10-CM | POA: Diagnosis not present

## 2017-07-04 DIAGNOSIS — E860 Dehydration: Secondary | ICD-10-CM | POA: Diagnosis not present

## 2017-07-04 DIAGNOSIS — L02215 Cutaneous abscess of perineum: Secondary | ICD-10-CM | POA: Diagnosis not present

## 2017-07-04 DIAGNOSIS — Z8249 Family history of ischemic heart disease and other diseases of the circulatory system: Secondary | ICD-10-CM | POA: Diagnosis not present

## 2017-07-04 DIAGNOSIS — Z992 Dependence on renal dialysis: Secondary | ICD-10-CM | POA: Diagnosis not present

## 2017-07-04 DIAGNOSIS — N189 Chronic kidney disease, unspecified: Secondary | ICD-10-CM | POA: Diagnosis not present

## 2017-07-04 DIAGNOSIS — M109 Gout, unspecified: Secondary | ICD-10-CM | POA: Diagnosis not present

## 2017-07-04 DIAGNOSIS — Z452 Encounter for adjustment and management of vascular access device: Secondary | ICD-10-CM | POA: Diagnosis not present

## 2017-07-04 DIAGNOSIS — Z48817 Encounter for surgical aftercare following surgery on the skin and subcutaneous tissue: Secondary | ICD-10-CM | POA: Diagnosis not present

## 2017-07-04 DIAGNOSIS — I10 Essential (primary) hypertension: Secondary | ICD-10-CM | POA: Diagnosis not present

## 2017-07-04 DIAGNOSIS — L03315 Cellulitis of perineum: Secondary | ICD-10-CM | POA: Diagnosis not present

## 2017-07-05 DIAGNOSIS — I96 Gangrene, not elsewhere classified: Secondary | ICD-10-CM | POA: Diagnosis not present

## 2017-07-06 DIAGNOSIS — N9089 Other specified noninflammatory disorders of vulva and perineum: Secondary | ICD-10-CM | POA: Diagnosis not present

## 2017-07-06 DIAGNOSIS — I96 Gangrene, not elsewhere classified: Secondary | ICD-10-CM | POA: Diagnosis not present

## 2017-07-13 DIAGNOSIS — Z48817 Encounter for surgical aftercare following surgery on the skin and subcutaneous tissue: Secondary | ICD-10-CM | POA: Diagnosis not present

## 2017-07-13 DIAGNOSIS — E1122 Type 2 diabetes mellitus with diabetic chronic kidney disease: Secondary | ICD-10-CM | POA: Diagnosis not present

## 2017-07-13 DIAGNOSIS — L03315 Cellulitis of perineum: Secondary | ICD-10-CM | POA: Diagnosis not present

## 2017-07-13 DIAGNOSIS — N186 End stage renal disease: Secondary | ICD-10-CM | POA: Diagnosis not present

## 2017-07-13 DIAGNOSIS — J45909 Unspecified asthma, uncomplicated: Secondary | ICD-10-CM | POA: Diagnosis not present

## 2017-07-13 DIAGNOSIS — I120 Hypertensive chronic kidney disease with stage 5 chronic kidney disease or end stage renal disease: Secondary | ICD-10-CM | POA: Diagnosis not present

## 2017-07-14 DIAGNOSIS — I120 Hypertensive chronic kidney disease with stage 5 chronic kidney disease or end stage renal disease: Secondary | ICD-10-CM | POA: Diagnosis not present

## 2017-07-14 DIAGNOSIS — Z23 Encounter for immunization: Secondary | ICD-10-CM | POA: Diagnosis not present

## 2017-07-14 DIAGNOSIS — J45909 Unspecified asthma, uncomplicated: Secondary | ICD-10-CM | POA: Diagnosis not present

## 2017-07-14 DIAGNOSIS — N179 Acute kidney failure, unspecified: Secondary | ICD-10-CM | POA: Diagnosis not present

## 2017-07-14 DIAGNOSIS — N186 End stage renal disease: Secondary | ICD-10-CM | POA: Diagnosis not present

## 2017-07-14 DIAGNOSIS — D509 Iron deficiency anemia, unspecified: Secondary | ICD-10-CM | POA: Diagnosis not present

## 2017-07-14 DIAGNOSIS — E1122 Type 2 diabetes mellitus with diabetic chronic kidney disease: Secondary | ICD-10-CM | POA: Diagnosis not present

## 2017-07-14 DIAGNOSIS — D649 Anemia, unspecified: Secondary | ICD-10-CM | POA: Diagnosis not present

## 2017-07-14 DIAGNOSIS — L03315 Cellulitis of perineum: Secondary | ICD-10-CM | POA: Diagnosis not present

## 2017-07-14 DIAGNOSIS — Z48817 Encounter for surgical aftercare following surgery on the skin and subcutaneous tissue: Secondary | ICD-10-CM | POA: Diagnosis not present

## 2017-07-14 DIAGNOSIS — N189 Chronic kidney disease, unspecified: Secondary | ICD-10-CM | POA: Diagnosis not present

## 2017-07-16 DIAGNOSIS — D509 Iron deficiency anemia, unspecified: Secondary | ICD-10-CM | POA: Diagnosis not present

## 2017-07-16 DIAGNOSIS — D649 Anemia, unspecified: Secondary | ICD-10-CM | POA: Diagnosis not present

## 2017-07-16 DIAGNOSIS — N179 Acute kidney failure, unspecified: Secondary | ICD-10-CM | POA: Diagnosis not present

## 2017-07-16 DIAGNOSIS — N189 Chronic kidney disease, unspecified: Secondary | ICD-10-CM | POA: Diagnosis not present

## 2017-07-16 DIAGNOSIS — Z23 Encounter for immunization: Secondary | ICD-10-CM | POA: Diagnosis not present

## 2017-07-18 DIAGNOSIS — I120 Hypertensive chronic kidney disease with stage 5 chronic kidney disease or end stage renal disease: Secondary | ICD-10-CM | POA: Diagnosis not present

## 2017-07-18 DIAGNOSIS — J45909 Unspecified asthma, uncomplicated: Secondary | ICD-10-CM | POA: Diagnosis not present

## 2017-07-18 DIAGNOSIS — E1122 Type 2 diabetes mellitus with diabetic chronic kidney disease: Secondary | ICD-10-CM | POA: Diagnosis not present

## 2017-07-18 DIAGNOSIS — L03315 Cellulitis of perineum: Secondary | ICD-10-CM | POA: Diagnosis not present

## 2017-07-18 DIAGNOSIS — N186 End stage renal disease: Secondary | ICD-10-CM | POA: Diagnosis not present

## 2017-07-18 DIAGNOSIS — Z48817 Encounter for surgical aftercare following surgery on the skin and subcutaneous tissue: Secondary | ICD-10-CM | POA: Diagnosis not present

## 2017-07-19 DIAGNOSIS — N179 Acute kidney failure, unspecified: Secondary | ICD-10-CM | POA: Diagnosis not present

## 2017-07-19 DIAGNOSIS — N189 Chronic kidney disease, unspecified: Secondary | ICD-10-CM | POA: Diagnosis not present

## 2017-07-19 DIAGNOSIS — Z23 Encounter for immunization: Secondary | ICD-10-CM | POA: Diagnosis not present

## 2017-07-19 DIAGNOSIS — D649 Anemia, unspecified: Secondary | ICD-10-CM | POA: Diagnosis not present

## 2017-07-19 DIAGNOSIS — D509 Iron deficiency anemia, unspecified: Secondary | ICD-10-CM | POA: Diagnosis not present

## 2017-07-21 DIAGNOSIS — D649 Anemia, unspecified: Secondary | ICD-10-CM | POA: Diagnosis not present

## 2017-07-21 DIAGNOSIS — N179 Acute kidney failure, unspecified: Secondary | ICD-10-CM | POA: Diagnosis not present

## 2017-07-21 DIAGNOSIS — I120 Hypertensive chronic kidney disease with stage 5 chronic kidney disease or end stage renal disease: Secondary | ICD-10-CM | POA: Diagnosis not present

## 2017-07-21 DIAGNOSIS — D509 Iron deficiency anemia, unspecified: Secondary | ICD-10-CM | POA: Diagnosis not present

## 2017-07-21 DIAGNOSIS — Z23 Encounter for immunization: Secondary | ICD-10-CM | POA: Diagnosis not present

## 2017-07-21 DIAGNOSIS — N189 Chronic kidney disease, unspecified: Secondary | ICD-10-CM | POA: Diagnosis not present

## 2017-07-21 DIAGNOSIS — N186 End stage renal disease: Secondary | ICD-10-CM | POA: Diagnosis not present

## 2017-07-21 DIAGNOSIS — L03315 Cellulitis of perineum: Secondary | ICD-10-CM | POA: Diagnosis not present

## 2017-07-21 DIAGNOSIS — E1122 Type 2 diabetes mellitus with diabetic chronic kidney disease: Secondary | ICD-10-CM | POA: Diagnosis not present

## 2017-07-21 DIAGNOSIS — J45909 Unspecified asthma, uncomplicated: Secondary | ICD-10-CM | POA: Diagnosis not present

## 2017-07-21 DIAGNOSIS — Z48817 Encounter for surgical aftercare following surgery on the skin and subcutaneous tissue: Secondary | ICD-10-CM | POA: Diagnosis not present

## 2017-07-22 DIAGNOSIS — E11621 Type 2 diabetes mellitus with foot ulcer: Secondary | ICD-10-CM | POA: Diagnosis not present

## 2017-07-22 DIAGNOSIS — L97118 Non-pressure chronic ulcer of right thigh with other specified severity: Secondary | ICD-10-CM | POA: Diagnosis not present

## 2017-07-22 DIAGNOSIS — L97418 Non-pressure chronic ulcer of right heel and midfoot with other specified severity: Secondary | ICD-10-CM | POA: Diagnosis not present

## 2017-07-22 DIAGNOSIS — E11622 Type 2 diabetes mellitus with other skin ulcer: Secondary | ICD-10-CM | POA: Diagnosis not present

## 2017-07-23 DIAGNOSIS — J45909 Unspecified asthma, uncomplicated: Secondary | ICD-10-CM | POA: Diagnosis not present

## 2017-07-23 DIAGNOSIS — E1122 Type 2 diabetes mellitus with diabetic chronic kidney disease: Secondary | ICD-10-CM | POA: Diagnosis not present

## 2017-07-23 DIAGNOSIS — D509 Iron deficiency anemia, unspecified: Secondary | ICD-10-CM | POA: Diagnosis not present

## 2017-07-23 DIAGNOSIS — L03315 Cellulitis of perineum: Secondary | ICD-10-CM | POA: Diagnosis not present

## 2017-07-23 DIAGNOSIS — Z48817 Encounter for surgical aftercare following surgery on the skin and subcutaneous tissue: Secondary | ICD-10-CM | POA: Diagnosis not present

## 2017-07-23 DIAGNOSIS — I120 Hypertensive chronic kidney disease with stage 5 chronic kidney disease or end stage renal disease: Secondary | ICD-10-CM | POA: Diagnosis not present

## 2017-07-23 DIAGNOSIS — N179 Acute kidney failure, unspecified: Secondary | ICD-10-CM | POA: Diagnosis not present

## 2017-07-23 DIAGNOSIS — Z23 Encounter for immunization: Secondary | ICD-10-CM | POA: Diagnosis not present

## 2017-07-23 DIAGNOSIS — D649 Anemia, unspecified: Secondary | ICD-10-CM | POA: Diagnosis not present

## 2017-07-23 DIAGNOSIS — N186 End stage renal disease: Secondary | ICD-10-CM | POA: Diagnosis not present

## 2017-07-23 DIAGNOSIS — N189 Chronic kidney disease, unspecified: Secondary | ICD-10-CM | POA: Diagnosis not present

## 2017-07-25 DIAGNOSIS — L03315 Cellulitis of perineum: Secondary | ICD-10-CM | POA: Diagnosis not present

## 2017-07-25 DIAGNOSIS — J45909 Unspecified asthma, uncomplicated: Secondary | ICD-10-CM | POA: Diagnosis not present

## 2017-07-25 DIAGNOSIS — N186 End stage renal disease: Secondary | ICD-10-CM | POA: Diagnosis not present

## 2017-07-25 DIAGNOSIS — I120 Hypertensive chronic kidney disease with stage 5 chronic kidney disease or end stage renal disease: Secondary | ICD-10-CM | POA: Diagnosis not present

## 2017-07-25 DIAGNOSIS — E1122 Type 2 diabetes mellitus with diabetic chronic kidney disease: Secondary | ICD-10-CM | POA: Diagnosis not present

## 2017-07-25 DIAGNOSIS — Z48817 Encounter for surgical aftercare following surgery on the skin and subcutaneous tissue: Secondary | ICD-10-CM | POA: Diagnosis not present

## 2017-07-26 DIAGNOSIS — Z23 Encounter for immunization: Secondary | ICD-10-CM | POA: Diagnosis not present

## 2017-07-26 DIAGNOSIS — N179 Acute kidney failure, unspecified: Secondary | ICD-10-CM | POA: Diagnosis not present

## 2017-07-26 DIAGNOSIS — D509 Iron deficiency anemia, unspecified: Secondary | ICD-10-CM | POA: Diagnosis not present

## 2017-07-26 DIAGNOSIS — M7989 Other specified soft tissue disorders: Secondary | ICD-10-CM | POA: Diagnosis not present

## 2017-07-26 DIAGNOSIS — D649 Anemia, unspecified: Secondary | ICD-10-CM | POA: Diagnosis not present

## 2017-07-26 DIAGNOSIS — N189 Chronic kidney disease, unspecified: Secondary | ICD-10-CM | POA: Diagnosis not present

## 2017-07-27 DIAGNOSIS — E119 Type 2 diabetes mellitus without complications: Secondary | ICD-10-CM | POA: Diagnosis not present

## 2017-07-27 DIAGNOSIS — Z6836 Body mass index (BMI) 36.0-36.9, adult: Secondary | ICD-10-CM | POA: Diagnosis not present

## 2017-07-27 DIAGNOSIS — S31109A Unspecified open wound of abdominal wall, unspecified quadrant without penetration into peritoneal cavity, initial encounter: Secondary | ICD-10-CM | POA: Diagnosis not present

## 2017-07-27 DIAGNOSIS — Z993 Dependence on wheelchair: Secondary | ICD-10-CM | POA: Diagnosis not present

## 2017-07-27 DIAGNOSIS — Z886 Allergy status to analgesic agent status: Secondary | ICD-10-CM | POA: Diagnosis not present

## 2017-07-27 DIAGNOSIS — Z79899 Other long term (current) drug therapy: Secondary | ICD-10-CM | POA: Diagnosis not present

## 2017-07-27 DIAGNOSIS — E7800 Pure hypercholesterolemia, unspecified: Secondary | ICD-10-CM | POA: Diagnosis not present

## 2017-07-27 DIAGNOSIS — Z794 Long term (current) use of insulin: Secondary | ICD-10-CM | POA: Diagnosis not present

## 2017-07-27 DIAGNOSIS — Z87891 Personal history of nicotine dependence: Secondary | ICD-10-CM | POA: Diagnosis not present

## 2017-07-27 DIAGNOSIS — I10 Essential (primary) hypertension: Secondary | ICD-10-CM | POA: Diagnosis not present

## 2017-07-27 DIAGNOSIS — J45909 Unspecified asthma, uncomplicated: Secondary | ICD-10-CM | POA: Diagnosis not present

## 2017-07-27 DIAGNOSIS — L02214 Cutaneous abscess of groin: Secondary | ICD-10-CM | POA: Diagnosis not present

## 2017-07-28 DIAGNOSIS — N179 Acute kidney failure, unspecified: Secondary | ICD-10-CM | POA: Diagnosis not present

## 2017-07-28 DIAGNOSIS — J45909 Unspecified asthma, uncomplicated: Secondary | ICD-10-CM | POA: Diagnosis not present

## 2017-07-28 DIAGNOSIS — L03315 Cellulitis of perineum: Secondary | ICD-10-CM | POA: Diagnosis not present

## 2017-07-28 DIAGNOSIS — D509 Iron deficiency anemia, unspecified: Secondary | ICD-10-CM | POA: Diagnosis not present

## 2017-07-28 DIAGNOSIS — E1122 Type 2 diabetes mellitus with diabetic chronic kidney disease: Secondary | ICD-10-CM | POA: Diagnosis not present

## 2017-07-28 DIAGNOSIS — Z48817 Encounter for surgical aftercare following surgery on the skin and subcutaneous tissue: Secondary | ICD-10-CM | POA: Diagnosis not present

## 2017-07-28 DIAGNOSIS — D649 Anemia, unspecified: Secondary | ICD-10-CM | POA: Diagnosis not present

## 2017-07-28 DIAGNOSIS — N186 End stage renal disease: Secondary | ICD-10-CM | POA: Diagnosis not present

## 2017-07-28 DIAGNOSIS — N189 Chronic kidney disease, unspecified: Secondary | ICD-10-CM | POA: Diagnosis not present

## 2017-07-28 DIAGNOSIS — I120 Hypertensive chronic kidney disease with stage 5 chronic kidney disease or end stage renal disease: Secondary | ICD-10-CM | POA: Diagnosis not present

## 2017-07-28 DIAGNOSIS — Z23 Encounter for immunization: Secondary | ICD-10-CM | POA: Diagnosis not present

## 2017-07-29 DIAGNOSIS — Z992 Dependence on renal dialysis: Secondary | ICD-10-CM | POA: Diagnosis not present

## 2017-07-29 DIAGNOSIS — N186 End stage renal disease: Secondary | ICD-10-CM | POA: Diagnosis not present

## 2017-07-30 DIAGNOSIS — N179 Acute kidney failure, unspecified: Secondary | ICD-10-CM | POA: Diagnosis not present

## 2017-07-30 DIAGNOSIS — D649 Anemia, unspecified: Secondary | ICD-10-CM | POA: Diagnosis not present

## 2017-07-30 DIAGNOSIS — N189 Chronic kidney disease, unspecified: Secondary | ICD-10-CM | POA: Diagnosis not present

## 2017-08-02 DIAGNOSIS — N189 Chronic kidney disease, unspecified: Secondary | ICD-10-CM | POA: Diagnosis not present

## 2017-08-02 DIAGNOSIS — D649 Anemia, unspecified: Secondary | ICD-10-CM | POA: Diagnosis not present

## 2017-08-02 DIAGNOSIS — N179 Acute kidney failure, unspecified: Secondary | ICD-10-CM | POA: Diagnosis not present

## 2017-08-03 DIAGNOSIS — E1122 Type 2 diabetes mellitus with diabetic chronic kidney disease: Secondary | ICD-10-CM | POA: Diagnosis not present

## 2017-08-03 DIAGNOSIS — N179 Acute kidney failure, unspecified: Secondary | ICD-10-CM | POA: Diagnosis not present

## 2017-08-03 DIAGNOSIS — J45909 Unspecified asthma, uncomplicated: Secondary | ICD-10-CM | POA: Diagnosis not present

## 2017-08-03 DIAGNOSIS — L03315 Cellulitis of perineum: Secondary | ICD-10-CM | POA: Diagnosis not present

## 2017-08-03 DIAGNOSIS — Z48817 Encounter for surgical aftercare following surgery on the skin and subcutaneous tissue: Secondary | ICD-10-CM | POA: Diagnosis not present

## 2017-08-03 DIAGNOSIS — I120 Hypertensive chronic kidney disease with stage 5 chronic kidney disease or end stage renal disease: Secondary | ICD-10-CM | POA: Diagnosis not present

## 2017-08-03 DIAGNOSIS — N186 End stage renal disease: Secondary | ICD-10-CM | POA: Diagnosis not present

## 2017-08-05 DIAGNOSIS — N179 Acute kidney failure, unspecified: Secondary | ICD-10-CM | POA: Diagnosis not present

## 2017-08-08 DIAGNOSIS — L03315 Cellulitis of perineum: Secondary | ICD-10-CM | POA: Diagnosis not present

## 2017-08-08 DIAGNOSIS — E1122 Type 2 diabetes mellitus with diabetic chronic kidney disease: Secondary | ICD-10-CM | POA: Diagnosis not present

## 2017-08-08 DIAGNOSIS — N186 End stage renal disease: Secondary | ICD-10-CM | POA: Diagnosis not present

## 2017-08-08 DIAGNOSIS — J45909 Unspecified asthma, uncomplicated: Secondary | ICD-10-CM | POA: Diagnosis not present

## 2017-08-08 DIAGNOSIS — I120 Hypertensive chronic kidney disease with stage 5 chronic kidney disease or end stage renal disease: Secondary | ICD-10-CM | POA: Diagnosis not present

## 2017-08-08 DIAGNOSIS — Z48817 Encounter for surgical aftercare following surgery on the skin and subcutaneous tissue: Secondary | ICD-10-CM | POA: Diagnosis not present

## 2017-08-11 DIAGNOSIS — I10 Essential (primary) hypertension: Secondary | ICD-10-CM | POA: Diagnosis not present

## 2017-08-11 DIAGNOSIS — Z794 Long term (current) use of insulin: Secondary | ICD-10-CM | POA: Diagnosis not present

## 2017-08-11 DIAGNOSIS — E119 Type 2 diabetes mellitus without complications: Secondary | ICD-10-CM | POA: Diagnosis not present

## 2017-08-11 DIAGNOSIS — Z87891 Personal history of nicotine dependence: Secondary | ICD-10-CM | POA: Diagnosis not present

## 2017-08-11 DIAGNOSIS — Z993 Dependence on wheelchair: Secondary | ICD-10-CM | POA: Diagnosis not present

## 2017-08-11 DIAGNOSIS — Z452 Encounter for adjustment and management of vascular access device: Secondary | ICD-10-CM | POA: Diagnosis not present

## 2017-08-11 DIAGNOSIS — N179 Acute kidney failure, unspecified: Secondary | ICD-10-CM | POA: Diagnosis not present

## 2017-08-11 DIAGNOSIS — Z886 Allergy status to analgesic agent status: Secondary | ICD-10-CM | POA: Diagnosis not present

## 2017-08-11 DIAGNOSIS — Z79899 Other long term (current) drug therapy: Secondary | ICD-10-CM | POA: Diagnosis not present

## 2017-08-15 DIAGNOSIS — Z48817 Encounter for surgical aftercare following surgery on the skin and subcutaneous tissue: Secondary | ICD-10-CM | POA: Diagnosis not present

## 2017-08-15 DIAGNOSIS — N186 End stage renal disease: Secondary | ICD-10-CM | POA: Diagnosis not present

## 2017-08-15 DIAGNOSIS — I120 Hypertensive chronic kidney disease with stage 5 chronic kidney disease or end stage renal disease: Secondary | ICD-10-CM | POA: Diagnosis not present

## 2017-08-15 DIAGNOSIS — J45909 Unspecified asthma, uncomplicated: Secondary | ICD-10-CM | POA: Diagnosis not present

## 2017-08-15 DIAGNOSIS — L03315 Cellulitis of perineum: Secondary | ICD-10-CM | POA: Diagnosis not present

## 2017-08-15 DIAGNOSIS — E1122 Type 2 diabetes mellitus with diabetic chronic kidney disease: Secondary | ICD-10-CM | POA: Diagnosis not present

## 2017-08-17 DIAGNOSIS — G4700 Insomnia, unspecified: Secondary | ICD-10-CM | POA: Diagnosis not present

## 2017-08-17 DIAGNOSIS — M79672 Pain in left foot: Secondary | ICD-10-CM | POA: Diagnosis not present

## 2017-08-17 DIAGNOSIS — Z79891 Long term (current) use of opiate analgesic: Secondary | ICD-10-CM | POA: Diagnosis not present

## 2017-08-17 DIAGNOSIS — M545 Low back pain: Secondary | ICD-10-CM | POA: Diagnosis not present

## 2017-08-25 DIAGNOSIS — J45909 Unspecified asthma, uncomplicated: Secondary | ICD-10-CM | POA: Diagnosis not present

## 2017-08-25 DIAGNOSIS — Z48817 Encounter for surgical aftercare following surgery on the skin and subcutaneous tissue: Secondary | ICD-10-CM | POA: Diagnosis not present

## 2017-08-25 DIAGNOSIS — E1122 Type 2 diabetes mellitus with diabetic chronic kidney disease: Secondary | ICD-10-CM | POA: Diagnosis not present

## 2017-08-25 DIAGNOSIS — N186 End stage renal disease: Secondary | ICD-10-CM | POA: Diagnosis not present

## 2017-08-25 DIAGNOSIS — I120 Hypertensive chronic kidney disease with stage 5 chronic kidney disease or end stage renal disease: Secondary | ICD-10-CM | POA: Diagnosis not present

## 2017-08-25 DIAGNOSIS — L03315 Cellulitis of perineum: Secondary | ICD-10-CM | POA: Diagnosis not present

## 2017-08-29 DIAGNOSIS — N183 Chronic kidney disease, stage 3 (moderate): Secondary | ICD-10-CM | POA: Diagnosis not present

## 2017-09-02 DIAGNOSIS — J45909 Unspecified asthma, uncomplicated: Secondary | ICD-10-CM | POA: Diagnosis not present

## 2017-09-02 DIAGNOSIS — E1122 Type 2 diabetes mellitus with diabetic chronic kidney disease: Secondary | ICD-10-CM | POA: Diagnosis not present

## 2017-09-02 DIAGNOSIS — I120 Hypertensive chronic kidney disease with stage 5 chronic kidney disease or end stage renal disease: Secondary | ICD-10-CM | POA: Diagnosis not present

## 2017-09-02 DIAGNOSIS — N186 End stage renal disease: Secondary | ICD-10-CM | POA: Diagnosis not present

## 2017-09-02 DIAGNOSIS — L03315 Cellulitis of perineum: Secondary | ICD-10-CM | POA: Diagnosis not present

## 2017-09-02 DIAGNOSIS — Z48817 Encounter for surgical aftercare following surgery on the skin and subcutaneous tissue: Secondary | ICD-10-CM | POA: Diagnosis not present

## 2017-09-06 DIAGNOSIS — N182 Chronic kidney disease, stage 2 (mild): Secondary | ICD-10-CM | POA: Diagnosis not present

## 2017-09-06 DIAGNOSIS — I10 Essential (primary) hypertension: Secondary | ICD-10-CM | POA: Diagnosis not present

## 2017-09-06 DIAGNOSIS — D649 Anemia, unspecified: Secondary | ICD-10-CM | POA: Diagnosis not present

## 2017-09-06 DIAGNOSIS — E11610 Type 2 diabetes mellitus with diabetic neuropathic arthropathy: Secondary | ICD-10-CM | POA: Diagnosis not present

## 2017-09-06 DIAGNOSIS — E119 Type 2 diabetes mellitus without complications: Secondary | ICD-10-CM | POA: Diagnosis not present

## 2017-09-06 DIAGNOSIS — F5101 Primary insomnia: Secondary | ICD-10-CM | POA: Diagnosis not present

## 2017-09-06 DIAGNOSIS — E1022 Type 1 diabetes mellitus with diabetic chronic kidney disease: Secondary | ICD-10-CM | POA: Diagnosis not present

## 2017-09-06 DIAGNOSIS — Z794 Long term (current) use of insulin: Secondary | ICD-10-CM | POA: Diagnosis not present

## 2017-09-07 DIAGNOSIS — J45909 Unspecified asthma, uncomplicated: Secondary | ICD-10-CM | POA: Diagnosis not present

## 2017-09-07 DIAGNOSIS — Z48817 Encounter for surgical aftercare following surgery on the skin and subcutaneous tissue: Secondary | ICD-10-CM | POA: Diagnosis not present

## 2017-09-07 DIAGNOSIS — L03315 Cellulitis of perineum: Secondary | ICD-10-CM | POA: Diagnosis not present

## 2017-09-07 DIAGNOSIS — E1122 Type 2 diabetes mellitus with diabetic chronic kidney disease: Secondary | ICD-10-CM | POA: Diagnosis not present

## 2017-09-07 DIAGNOSIS — I120 Hypertensive chronic kidney disease with stage 5 chronic kidney disease or end stage renal disease: Secondary | ICD-10-CM | POA: Diagnosis not present

## 2017-09-07 DIAGNOSIS — N186 End stage renal disease: Secondary | ICD-10-CM | POA: Diagnosis not present

## 2017-09-12 DIAGNOSIS — D509 Iron deficiency anemia, unspecified: Secondary | ICD-10-CM | POA: Insufficient documentation

## 2017-09-13 DIAGNOSIS — Z9889 Other specified postprocedural states: Secondary | ICD-10-CM | POA: Diagnosis not present

## 2017-09-13 DIAGNOSIS — L89619 Pressure ulcer of right heel, unspecified stage: Secondary | ICD-10-CM | POA: Diagnosis not present

## 2017-09-15 DIAGNOSIS — G4700 Insomnia, unspecified: Secondary | ICD-10-CM | POA: Diagnosis not present

## 2017-09-15 DIAGNOSIS — Z79891 Long term (current) use of opiate analgesic: Secondary | ICD-10-CM | POA: Diagnosis not present

## 2017-09-15 DIAGNOSIS — M545 Low back pain: Secondary | ICD-10-CM | POA: Diagnosis not present

## 2017-09-15 DIAGNOSIS — M79672 Pain in left foot: Secondary | ICD-10-CM | POA: Diagnosis not present

## 2017-09-20 DIAGNOSIS — R809 Proteinuria, unspecified: Secondary | ICD-10-CM | POA: Diagnosis not present

## 2017-09-20 DIAGNOSIS — N183 Chronic kidney disease, stage 3 (moderate): Secondary | ICD-10-CM | POA: Diagnosis not present

## 2017-09-20 DIAGNOSIS — E1029 Type 1 diabetes mellitus with other diabetic kidney complication: Secondary | ICD-10-CM | POA: Diagnosis not present

## 2017-09-20 DIAGNOSIS — E872 Acidosis: Secondary | ICD-10-CM | POA: Diagnosis not present

## 2017-09-20 DIAGNOSIS — I10 Essential (primary) hypertension: Secondary | ICD-10-CM | POA: Diagnosis not present

## 2017-10-13 ENCOUNTER — Ambulatory Visit: Payer: Self-pay | Admitting: "Endocrinology

## 2017-10-21 DIAGNOSIS — L89619 Pressure ulcer of right heel, unspecified stage: Secondary | ICD-10-CM | POA: Diagnosis not present

## 2017-10-21 DIAGNOSIS — M7989 Other specified soft tissue disorders: Secondary | ICD-10-CM | POA: Diagnosis not present

## 2017-10-21 DIAGNOSIS — L089 Local infection of the skin and subcutaneous tissue, unspecified: Secondary | ICD-10-CM | POA: Diagnosis not present

## 2017-10-21 DIAGNOSIS — L02214 Cutaneous abscess of groin: Secondary | ICD-10-CM | POA: Diagnosis not present

## 2017-11-15 DIAGNOSIS — Z79891 Long term (current) use of opiate analgesic: Secondary | ICD-10-CM | POA: Diagnosis not present

## 2017-11-15 DIAGNOSIS — M545 Low back pain: Secondary | ICD-10-CM | POA: Diagnosis not present

## 2017-11-15 DIAGNOSIS — G4700 Insomnia, unspecified: Secondary | ICD-10-CM | POA: Diagnosis not present

## 2017-11-15 DIAGNOSIS — M79672 Pain in left foot: Secondary | ICD-10-CM | POA: Diagnosis not present

## 2018-01-10 DIAGNOSIS — G4700 Insomnia, unspecified: Secondary | ICD-10-CM | POA: Diagnosis not present

## 2018-01-10 DIAGNOSIS — M79672 Pain in left foot: Secondary | ICD-10-CM | POA: Diagnosis not present

## 2018-01-10 DIAGNOSIS — M545 Low back pain: Secondary | ICD-10-CM | POA: Diagnosis not present

## 2018-01-10 DIAGNOSIS — Z79891 Long term (current) use of opiate analgesic: Secondary | ICD-10-CM | POA: Diagnosis not present

## 2018-01-11 DIAGNOSIS — M79672 Pain in left foot: Secondary | ICD-10-CM | POA: Diagnosis not present

## 2018-01-11 DIAGNOSIS — Z79891 Long term (current) use of opiate analgesic: Secondary | ICD-10-CM | POA: Diagnosis not present

## 2018-01-11 DIAGNOSIS — M545 Low back pain: Secondary | ICD-10-CM | POA: Diagnosis not present

## 2018-03-10 DIAGNOSIS — Z79891 Long term (current) use of opiate analgesic: Secondary | ICD-10-CM | POA: Diagnosis not present

## 2018-03-10 DIAGNOSIS — M545 Low back pain: Secondary | ICD-10-CM | POA: Diagnosis not present

## 2018-03-10 DIAGNOSIS — M79672 Pain in left foot: Secondary | ICD-10-CM | POA: Diagnosis not present

## 2018-03-10 DIAGNOSIS — G4700 Insomnia, unspecified: Secondary | ICD-10-CM | POA: Diagnosis not present

## 2018-05-09 DIAGNOSIS — Z79891 Long term (current) use of opiate analgesic: Secondary | ICD-10-CM | POA: Diagnosis not present

## 2018-05-09 DIAGNOSIS — M545 Low back pain: Secondary | ICD-10-CM | POA: Diagnosis not present

## 2018-05-09 DIAGNOSIS — M79672 Pain in left foot: Secondary | ICD-10-CM | POA: Diagnosis not present

## 2018-05-09 DIAGNOSIS — G4700 Insomnia, unspecified: Secondary | ICD-10-CM | POA: Diagnosis not present

## 2018-06-08 DIAGNOSIS — N182 Chronic kidney disease, stage 2 (mild): Secondary | ICD-10-CM | POA: Diagnosis not present

## 2018-06-08 DIAGNOSIS — I152 Hypertension secondary to endocrine disorders: Secondary | ICD-10-CM | POA: Diagnosis not present

## 2018-06-08 DIAGNOSIS — D509 Iron deficiency anemia, unspecified: Secondary | ICD-10-CM | POA: Diagnosis not present

## 2018-06-08 DIAGNOSIS — E1022 Type 1 diabetes mellitus with diabetic chronic kidney disease: Secondary | ICD-10-CM | POA: Diagnosis not present

## 2018-06-08 DIAGNOSIS — E11610 Type 2 diabetes mellitus with diabetic neuropathic arthropathy: Secondary | ICD-10-CM | POA: Diagnosis not present

## 2018-06-08 DIAGNOSIS — E785 Hyperlipidemia, unspecified: Secondary | ICD-10-CM | POA: Diagnosis not present

## 2018-07-04 DIAGNOSIS — Z79891 Long term (current) use of opiate analgesic: Secondary | ICD-10-CM | POA: Diagnosis not present

## 2018-07-04 DIAGNOSIS — M545 Low back pain: Secondary | ICD-10-CM | POA: Diagnosis not present

## 2018-07-04 DIAGNOSIS — G4700 Insomnia, unspecified: Secondary | ICD-10-CM | POA: Diagnosis not present

## 2018-07-04 DIAGNOSIS — G2581 Restless legs syndrome: Secondary | ICD-10-CM | POA: Diagnosis not present

## 2018-07-25 ENCOUNTER — Ambulatory Visit: Payer: Medicare Other | Admitting: "Endocrinology

## 2018-07-27 ENCOUNTER — Ambulatory Visit: Payer: Medicare Other | Admitting: "Endocrinology

## 2018-08-10 ENCOUNTER — Ambulatory Visit (INDEPENDENT_AMBULATORY_CARE_PROVIDER_SITE_OTHER): Payer: Medicare Other | Admitting: "Endocrinology

## 2018-08-10 ENCOUNTER — Encounter: Payer: Self-pay | Admitting: "Endocrinology

## 2018-08-10 ENCOUNTER — Encounter (INDEPENDENT_AMBULATORY_CARE_PROVIDER_SITE_OTHER): Payer: Self-pay

## 2018-08-10 ENCOUNTER — Other Ambulatory Visit: Payer: Self-pay

## 2018-08-10 VITALS — BP 166/97 | HR 71 | Ht 68.0 in | Wt 271.0 lb

## 2018-08-10 DIAGNOSIS — E559 Vitamin D deficiency, unspecified: Secondary | ICD-10-CM | POA: Diagnosis not present

## 2018-08-10 DIAGNOSIS — E782 Mixed hyperlipidemia: Secondary | ICD-10-CM | POA: Diagnosis not present

## 2018-08-10 DIAGNOSIS — I10 Essential (primary) hypertension: Secondary | ICD-10-CM | POA: Diagnosis not present

## 2018-08-10 DIAGNOSIS — E1022 Type 1 diabetes mellitus with diabetic chronic kidney disease: Secondary | ICD-10-CM | POA: Diagnosis not present

## 2018-08-10 NOTE — Progress Notes (Signed)
Endocrinology Consult Note       08/10/2018, 4:00 PM   Subjective:    Patient ID: Sally Porter, female    DOB: [birth year].  Sally Porter is being seen in consultation for management of currently uncontrolled symptomatic diabetes requested by  Bridget Hartshorn, NP.   Past Medical History:  Diagnosis Date  . Diabetes mellitus without complication ([place])   . Diabetes mellitus, type II ([place])   . Glaucoma   . Hyperlipidemia   . Hypertension     History reviewed. No pertinent surgical history.  Social History   Socioeconomic History  . Marital status: Single    Spouse name: Not on file  . Number of children: Not on file  . Years of education: Not on file  . Highest education level: Not on file  Occupational History  . Not on file  Social Needs  . Financial resource strain: Not on file  . Food insecurity    Worry: Not on file    Inability: Not on file  . Transportation needs    Medical: Not on file    Non-medical: Not on file  Tobacco Use  . Smoking status: Never Smoker  . Smokeless tobacco: Never Used  Substance and Sexual Activity  . Alcohol use: Not Currently  . Drug use: Never  . Sexual activity: Not on file  Lifestyle  . Physical activity    Days per week: Not on file    Minutes per session: Not on file  . Stress: Not on file  Relationships  . Social Herbalist on phone: Not on file    Gets together: Not on file    Attends religious service: Not on file    Active member of club or organization: Not on file    Attends meetings of clubs or organizations: Not on file    Relationship status: Not on file  Other Topics Concern  . Not on file  Social History Narrative  . Not on file    History reviewed. No pertinent family history.  Outpatient Encounter Medications as of 08/10/2018  Medication Sig  . atorvastatin (LIPITOR) 20 MG tablet daily.  . ferrous  sulfate 325 (65 FE) MG tablet Take 325 mg by mouth daily with breakfast.  . insulin aspart (NOVOLOG FLEXPEN) 100 UNIT/ML FlexPen Inject 5-11 Units into the skin 3 (three) times daily before meals.  . Insulin Glargine (LANTUS SOLOSTAR) 100 UNIT/ML Solostar Pen 34 Units at bedtime.  . Multiple Vitamins-Minerals (ZINC PO) Take by mouth daily.  Marland Kitchen oxyCODONE-acetaminophen (PERCOCET) 10-325 MG tablet every 6 (six) hours as needed.  . ALPRAZolam (XANAX) 0.5 MG tablet 1-2 tablets at bedtime.  Marland Kitchen atenolol (TENORMIN) 50 MG tablet Take 50 mg by mouth daily.  . benazepril (LOTENSIN) 10 MG tablet Take 10 mg by mouth daily.  . colchicine 0.6 MG tablet Take 0.6 mg by mouth daily.  . Febuxostat (ULORIC) 80 MG TABS Take 1 tablet by mouth daily.  Marland Kitchen HYDROcodone-acetaminophen (NORCO) 7.5-325 MG per tablet Take 1 tablet by mouth every 6 (six) hours as needed for moderate pain.  Marland Kitchen lisinopril (ZESTRIL) 10 MG tablet  Take 10 mg by mouth daily.  Marland Kitchen lovastatin (MEVACOR) 10 MG tablet Take 10 mg by mouth at bedtime.  . methylPREDNISolone (MEDROL DOSEPAK) 4 MG tablet Take 4 mg by mouth. follow package directions  . nortriptyline (PAMELOR) 10 MG capsule 2 (two) times a day.  Marland Kitchen rOPINIRole (REQUIP) 0.5 MG tablet at bedtime.  . sodium bicarbonate 650 MG tablet Take 1,300 mg by mouth 2 (two) times daily.  . [DISCONTINUED] insulin aspart protamine- aspart (NOVOLOG MIX 70/30) (70-30) 100 UNIT/ML injection Inject into the skin.  . [DISCONTINUED] insulin glargine (LANTUS) 100 UNIT/ML injection Inject 100 Units into the skin at bedtime.   No facility-administered encounter medications on file as of 08/10/2018.     ALLERGIES: Allergies  Allergen Reactions  . Morphine And Related     VACCINATION STATUS:  There is no immunization history on file for this patient.  Diabetes She presents for her initial diabetic visit. She has type 1 diabetes mellitus. Onset time: She was diagnosed at approximate age of 78 years. Her disease  course has been worsening (She does not have recent labs, however, her A1c was reportedly 10% in July 2019.). There are no hypoglycemic associated symptoms. Pertinent negatives for hypoglycemia include no confusion, headaches, pallor or seizures. Associated symptoms include blurred vision, fatigue, polydipsia and polyuria. Pertinent negatives for diabetes include no chest pain and no polyphagia. There are no hypoglycemic complications. Symptoms are worsening. Risk factors for coronary artery disease include dyslipidemia, diabetes mellitus, family history, hypertension, obesity, sedentary lifestyle and tobacco exposure. Current diabetic treatment includes insulin injections. Her weight is increasing steadily. She is following a generally unhealthy diet. When asked about meal planning, she reported none. She has not had a previous visit with a dietitian. She rarely participates in exercise. (She did not bring any logs nor meter to review.  Her most recent A1c was from July 2019 when it was reported to be 10%.) An ACE inhibitor/angiotensin II receptor blocker is being taken. She sees a podiatrist.Eye exam is current.  Hypertension This is a chronic problem. The current episode started more than 1 year ago. Associated symptoms include blurred vision. Pertinent negatives include no chest pain, headaches, palpitations or shortness of breath. Risk factors for coronary artery disease include dyslipidemia, diabetes mellitus, obesity, sedentary lifestyle and smoking/tobacco exposure. Past treatments include ACE inhibitors and beta blockers. Hypertensive end-organ damage includes kidney disease.  Hyperlipidemia Pertinent negatives include no chest pain, myalgias or shortness of breath.    Review of Systems  Constitutional: Positive for fatigue. Negative for chills, fever and unexpected weight change.  HENT: Negative for trouble swallowing and voice change.   Eyes: Positive for blurred vision. Negative for visual  disturbance.  Respiratory: Negative for cough, shortness of breath and wheezing.   Cardiovascular: Negative for chest pain, palpitations and leg swelling.  Gastrointestinal: Negative for diarrhea, nausea and vomiting.  Endocrine: Positive for polydipsia and polyuria. Negative for cold intolerance, heat intolerance and polyphagia.  Musculoskeletal: Negative for arthralgias and myalgias.  Skin: Negative for color change, pallor, rash and wound.  Neurological: Negative for seizures and headaches.  Psychiatric/Behavioral: Negative for confusion and suicidal ideas.    Objective:    BP (!) 166/97   Pulse 71   Ht 5\' 8"  (1.727 m)   Wt 271 lb (122.9 kg)   BMI 41.21 kg/m   Wt Readings from Last 3 Encounters:  08/10/18 271 lb (122.9 kg)     Physical Exam Constitutional:      Appearance: She is well-developed.  HENT:     Head: Normocephalic and atraumatic.  Neck:     Musculoskeletal: Normal range of motion and neck supple.     Thyroid: No thyromegaly.     Trachea: No tracheal deviation.  Cardiovascular:     Rate and Rhythm: Normal rate and regular rhythm.  Pulmonary:     Effort: Pulmonary effort is normal.     Breath sounds: Normal breath sounds.  Abdominal:     General: Bowel sounds are normal.     Tenderness: There is no abdominal tenderness. There is no guarding.  Musculoskeletal: Normal range of motion.  Skin:    General: Skin is warm and dry.     Coloration: Skin is not pale.     Findings: No erythema or rash.  Neurological:     Mental Status: She is alert and oriented to person, place, and time.     Cranial Nerves: No cranial nerve deficit.     Coordination: Coordination normal.     Deep Tendon Reflexes: Reflexes are normal and symmetric.  Psychiatric:        Judgment: Judgment normal.       CMP ( most recent) CMP     Component Value Date/Time   NA 135 11/02/2015 0815   K 4.5 11/02/2015 0815   CL 103 11/02/2015 0815   CO2 25 11/02/2015 0815   GLUCOSE 144 (H)  11/02/2015 0815   BUN 37 (H) 11/02/2015 0815   CREATININE 2.20 (H) 11/02/2015 0815   CALCIUM 8.9 11/02/2015 0815   PROT 6.7 11/02/2015 0815   ALBUMIN 2.9 (L) 11/02/2015 0815   AST 16 11/02/2015 0815   ALT 15 11/02/2015 0815   ALKPHOS 73 11/02/2015 0815   BILITOT 0.3 11/02/2015 0815   GFRNONAA 28 (L) 11/02/2015 0815   GFRAA 32 (L) 11/02/2015 0815    Assessment & Plan:   1. Type 1 diabetes mellitus with diabetic chronic kidney disease, unspecified CKD stage ([place])  - Caitlyne Akard has currently uncontrolled symptomatic type 2 DM since  38 years of age,  with most recent A1c of 10 %.  -She will be sent to lab for A1c, CMP, thyroid function test. - I had a long discussion with her about the progressive nature of diabetes and the pathology behind its complications. -her diabetes is complicated by history of recurrent DKA, CKD and she remains at a high risk for more acute and chronic complications which include CAD, CVA, CKD, retinopathy, and neuropathy. These are all discussed in detail with her.  - I have counseled her on diet management and weight loss, by adopting a carbohydrate restricted/protein rich diet. - she admits that there is a room for improvement in her food and drink choices. - Suggestion is made for her to avoid simple carbohydrates  from her diet including Cakes, Sweet Desserts, Ice Cream, Soda (diet and regular), Sweet Tea, Candies, Chips, Cookies, Store Bought Juices, Alcohol in Excess of  1-2 drinks a day, Artificial Sweeteners,  Coffee Creamer, and "Sugar-free" Products. This will help patient to have more stable blood glucose profile and potentially avoid unintended weight gain.  - I encouraged her to switch to  unprocessed or minimally processed complex starch and increased protein intake (animal or plant source), fruits, and vegetables.  - she is advised to stick to a routine mealtimes to eat 3 meals  a day and avoid unnecessary snacks ( to snack only to correct  hypoglycemia).   - she will be scheduled with Jearld Fenton, RDN, CDE  for individualized diabetes education.  - I have approached her with the following individualized plan to manage diabetes and patient agrees:   - she will be continued on readjusted basal/bolus insulin for her to achieve control of diabetes to target.   -She is advised to continue Lantus 34 units  daily at bedtime , and readjust her prandial insulin NovoLog to 5 units 3 times a day with meals  for pre-meal BG readings of 70-150mg /dl, plus patient specific correction dose for unexpected hyperglycemia above 150mg /dl, associated with strict monitoring of glucose 4 times a day-before meals and at bedtime. - she is warned not to take insulin without proper monitoring per orders. - Adjustment parameters are given to her for hypo and hyperglycemia in writing. - she is encouraged to call clinic for blood glucose levels less than 70 or above 300 mg /dl.   - Patient specific target  A1c;  LDL, HDL, Triglycerides, and  Waist Circumference were discussed in detail.  2) Blood Pressure /Hypertension:  her blood pressure is  uncontrolled to target.   she is advised to continue her current medications including lisinopril 10 mg once a day, atenolol 50 mg p.o. daily with breakfast .  3) Lipids/Hyperlipidemia:   No recent lipid panel to review.  She is advised to continue atorvastatin 20 mg p.o. nightly.   Side effects and precautions discussed with her.  4)  Weight/Diet:  Body mass index is 41.21 kg/m.  -   clearly complicating her diabetes care.  I discussed with her the fact that loss of 5 - 10% of her  current body weight will have the most impact on her diabetes management.  CDE Consult will be initiated . Exercise, and detailed carbohydrates information provided  -  detailed on discharge instructions.  5) Chronic Care/Health Maintenance:  -she  is on ACEI/ARB and Statin medications and  is encouraged to initiate and continue to follow  up with Ophthalmology, Dentist,  Podiatrist at least yearly or according to recommendations, and advised to  stay away from smoking. I have recommended yearly flu vaccine and pneumonia vaccine at least every 5 years; moderate intensity exercise for up to 150 minutes weekly; and  sleep for at least 7 hours a day.  - she is  advised to maintain close follow up with [place], Karie Schwalbe, NP for primary care needs, as well as her other providers for optimal and coordinated care.  - Time spent with the patient: 45 minutes, of which >50% was spent in obtaining information about her symptoms, reviewing her previous labs/studies, evaluations, and treatments, counseling her about her currently uncontrolled, complicated type 1 diabetes, hyperlipidemia, hypertension, and developing plans for long term treatment based on the latest standards of care/guidelines.  Please refer to " Patient Self Inventory" in the Media  tab for reviewed elements of pertinent patient history.  Sheridyn Tankard participated in the discussions, expressed understanding, and voiced agreement with the above plans.  All questions were answered to her satisfaction. she is encouraged to contact clinic should she have any questions or concerns prior to her return visit.  Follow up plan: - Return in about 10 days (around 08/20/2018) for Labs Today- Non-Fasting Ok, Follow up with Pre-visit Labs, Meter, and Logs.  Glade Lloyd, MD [facility] [address] [place], [place] [address] Phone: [phone]  Fax: [phone]    08/10/2018, 4:00 PM  This note was partially dictated with voice recognition software. Similar sounding words can be transcribed  inadequately or may not  be corrected upon review.

## 2018-08-10 NOTE — Patient Instructions (Signed)

## 2018-08-11 ENCOUNTER — Other Ambulatory Visit: Payer: Self-pay | Admitting: "Endocrinology

## 2018-08-11 LAB — MICROALBUMIN / CREATININE URINE RATIO
Creatinine, Urine: 70 mg/dL (ref 20–275)
Microalb Creat Ratio: 3246 mcg/mg creat — ABNORMAL HIGH (ref <30)
Microalb, Ur: 227.2 mg/dL

## 2018-08-11 LAB — COMPLETE METABOLIC PANEL WITH GFR
AG Ratio: 1 (calc) (ref 1.0–2.5)
ALT: 9 U/L (ref 6–29)
AST: 17 U/L (ref 10–30)
Albumin: 3.6 g/dL (ref 3.6–5.1)
Alkaline phosphatase (APISO): 134 U/L — ABNORMAL HIGH (ref 31–125)
BUN/Creatinine Ratio: 13 (calc) (ref 6–22)
BUN: 37 mg/dL — ABNORMAL HIGH (ref 7–25)
CO2: 15 mmol/L — ABNORMAL LOW (ref 20–32)
Calcium: 8.7 mg/dL (ref 8.6–10.2)
Chloride: 112 mmol/L — ABNORMAL HIGH (ref 98–110)
Creat: 2.83 mg/dL — ABNORMAL HIGH (ref 0.50–1.10)
GFR, Est African American: 24 mL/min/{1.73_m2} — ABNORMAL LOW (ref >=60)
GFR, Est Non African American: 20 mL/min/{1.73_m2} — ABNORMAL LOW (ref >=60)
Globulin: 3.6 g/dL (calc) (ref 1.9–3.7)
Glucose, Bld: 99 mg/dL (ref 65–99)
Potassium: 6 mmol/L — ABNORMAL HIGH (ref 3.5–5.3)
Sodium: 139 mmol/L (ref 135–146)
Total Bilirubin: 0.2 mg/dL (ref 0.2–1.2)
Total Protein: 7.2 g/dL (ref 6.1–8.1)

## 2018-08-11 LAB — LIPID PANEL
Cholesterol: 200 mg/dL — ABNORMAL HIGH (ref <200)
HDL: 51 mg/dL (ref >=50)
LDL Cholesterol (Calc): 122 mg/dL (calc) — ABNORMAL HIGH
Non-HDL Cholesterol (Calc): 149 mg/dL (calc) — ABNORMAL HIGH (ref <130)
Total CHOL/HDL Ratio: 3.9 (calc) (ref <5.0)
Triglycerides: 156 mg/dL — ABNORMAL HIGH (ref <150)

## 2018-08-11 LAB — TSH: TSH: 3.75 mIU/L

## 2018-08-11 LAB — HEMOGLOBIN A1C
Hgb A1c MFr Bld: 8.8 % of total Hgb — ABNORMAL HIGH (ref <5.7)
Mean Plasma Glucose: 206 (calc)
eAG (mmol/L): 11.4 (calc)

## 2018-08-11 LAB — T4, FREE: Free T4: 0.8 ng/dL (ref 0.8–1.8)

## 2018-08-11 LAB — VITAMIN D 25 HYDROXY (VIT D DEFICIENCY, FRACTURES): Vit D, 25-Hydroxy: 6 ng/mL — ABNORMAL LOW (ref 30–100)

## 2018-08-11 MED ORDER — VITAMIN D (ERGOCALCIFEROL) 1.25 MG (50000 UNIT) PO CAPS: 50000.0000 [IU] | ORAL_CAPSULE | ORAL | 0 refills | Status: DC

## 2018-08-17 ENCOUNTER — Telehealth: Payer: Self-pay | Admitting: "Endocrinology

## 2018-08-17 NOTE — Telephone Encounter (Signed)
Vitamin D is unlikely to cause this elevation in her BP.she needs more treatment for her BP. She can double her lisinopril to 20 mg , keep measuring at least once a day, call back if >160/90.

## 2018-08-17 NOTE — Telephone Encounter (Signed)
Dr Nida Please advise 

## 2018-08-17 NOTE — Telephone Encounter (Signed)
Patient is aware of recommendations °

## 2018-08-17 NOTE — Telephone Encounter (Signed)
Patient states ever since she took her Vitamin D medication on Saturday (first dose), she has started to have a headache and high BP. She said Tuesday her BP was 213/101. Yesterday she had a reading of 160/80 and she said this is the only thing she has done different.

## 2018-08-22 ENCOUNTER — Ambulatory Visit: Payer: Medicare Other | Admitting: "Endocrinology

## 2018-08-22 ENCOUNTER — Encounter: Payer: Self-pay | Admitting: "Endocrinology

## 2018-08-25 DIAGNOSIS — L02818 Cutaneous abscess of other sites: Secondary | ICD-10-CM | POA: Diagnosis not present

## 2018-08-30 DIAGNOSIS — G4700 Insomnia, unspecified: Secondary | ICD-10-CM | POA: Diagnosis not present

## 2018-08-30 DIAGNOSIS — M545 Low back pain: Secondary | ICD-10-CM | POA: Diagnosis not present

## 2018-08-30 DIAGNOSIS — Z79891 Long term (current) use of opiate analgesic: Secondary | ICD-10-CM | POA: Diagnosis not present

## 2018-08-30 DIAGNOSIS — M79672 Pain in left foot: Secondary | ICD-10-CM | POA: Diagnosis not present

## 2018-08-30 DIAGNOSIS — G2581 Restless legs syndrome: Secondary | ICD-10-CM | POA: Diagnosis not present

## 2018-10-25 DIAGNOSIS — M545 Low back pain: Secondary | ICD-10-CM | POA: Diagnosis not present

## 2018-10-25 DIAGNOSIS — M79672 Pain in left foot: Secondary | ICD-10-CM | POA: Diagnosis not present

## 2018-10-25 DIAGNOSIS — G2581 Restless legs syndrome: Secondary | ICD-10-CM | POA: Diagnosis not present

## 2018-10-25 DIAGNOSIS — Z79891 Long term (current) use of opiate analgesic: Secondary | ICD-10-CM | POA: Diagnosis not present

## 2018-10-25 DIAGNOSIS — G4700 Insomnia, unspecified: Secondary | ICD-10-CM | POA: Diagnosis not present

## 2018-10-26 ENCOUNTER — Other Ambulatory Visit: Payer: Self-pay | Admitting: "Endocrinology

## 2018-12-15 ENCOUNTER — Other Ambulatory Visit: Payer: Self-pay | Admitting: "Endocrinology

## 2018-12-28 ENCOUNTER — Other Ambulatory Visit: Payer: Self-pay | Admitting: "Endocrinology

## 2019-02-06 ENCOUNTER — Other Ambulatory Visit: Payer: Self-pay

## 2019-02-06 DIAGNOSIS — E1022 Type 1 diabetes mellitus with diabetic chronic kidney disease: Secondary | ICD-10-CM

## 2019-02-06 DIAGNOSIS — E559 Vitamin D deficiency, unspecified: Secondary | ICD-10-CM

## 2019-02-06 MED ORDER — NOVOLOG FLEXPEN 100 UNIT/ML ~~LOC~~ SOPN: 5.0000 [IU] | PEN_INJECTOR | Freq: Three times a day (TID) | SUBCUTANEOUS | 0 refills | Status: DC

## 2019-02-06 MED ORDER — ATENOLOL 50 MG PO TABS: 50.0000 mg | ORAL_TABLET | Freq: Every day | ORAL | 0 refills | Status: DC

## 2019-02-06 MED ORDER — LISINOPRIL 10 MG PO TABS: 10.0000 mg | ORAL_TABLET | Freq: Every day | ORAL | 0 refills | Status: DC

## 2019-02-07 ENCOUNTER — Other Ambulatory Visit: Payer: Self-pay

## 2019-02-07 MED ORDER — INSULIN LISPRO (1 UNIT DIAL) 100 UNIT/ML (KWIKPEN): 5.0000 [IU] | PEN_INJECTOR | Freq: Three times a day (TID) | SUBCUTANEOUS | 0 refills | Status: DC

## 2019-02-26 ENCOUNTER — Other Ambulatory Visit: Payer: Self-pay | Admitting: "Endocrinology

## 2019-03-13 ENCOUNTER — Other Ambulatory Visit: Payer: Self-pay | Admitting: "Endocrinology

## 2019-03-13 DIAGNOSIS — R5383 Other fatigue: Secondary | ICD-10-CM

## 2019-03-13 DIAGNOSIS — I10 Essential (primary) hypertension: Secondary | ICD-10-CM

## 2019-03-13 DIAGNOSIS — E782 Mixed hyperlipidemia: Secondary | ICD-10-CM

## 2019-03-13 DIAGNOSIS — E559 Vitamin D deficiency, unspecified: Secondary | ICD-10-CM

## 2019-03-13 DIAGNOSIS — E1022 Type 1 diabetes mellitus with diabetic chronic kidney disease: Secondary | ICD-10-CM

## 2019-03-13 NOTE — Telephone Encounter (Signed)
She can get  a rx for 1 box , come back for a f/u  before next refill or stay with PCP.

## 2019-03-13 NOTE — Telephone Encounter (Signed)
Last OV 08-10-18

## 2019-04-05 ENCOUNTER — Other Ambulatory Visit: Payer: Self-pay | Admitting: "Endocrinology

## 2019-04-17 ENCOUNTER — Other Ambulatory Visit: Payer: Self-pay | Admitting: "Endocrinology

## 2019-04-18 ENCOUNTER — Other Ambulatory Visit: Payer: Self-pay | Admitting: "Endocrinology

## 2019-04-26 ENCOUNTER — Other Ambulatory Visit: Payer: Self-pay | Admitting: "Endocrinology

## 2019-04-27 ENCOUNTER — Encounter: Payer: Self-pay | Admitting: "Endocrinology

## 2019-04-27 DIAGNOSIS — I10 Essential (primary) hypertension: Secondary | ICD-10-CM | POA: Diagnosis not present

## 2019-04-27 DIAGNOSIS — Z87891 Personal history of nicotine dependence: Secondary | ICD-10-CM | POA: Diagnosis not present

## 2019-04-27 DIAGNOSIS — Z79899 Other long term (current) drug therapy: Secondary | ICD-10-CM | POA: Diagnosis not present

## 2019-04-27 DIAGNOSIS — E119 Type 2 diabetes mellitus without complications: Secondary | ICD-10-CM | POA: Diagnosis not present

## 2019-04-27 DIAGNOSIS — E872 Acidosis: Secondary | ICD-10-CM | POA: Diagnosis not present

## 2019-04-27 DIAGNOSIS — Z794 Long term (current) use of insulin: Secondary | ICD-10-CM | POA: Diagnosis not present

## 2019-04-27 DIAGNOSIS — E875 Hyperkalemia: Secondary | ICD-10-CM | POA: Diagnosis not present

## 2019-04-27 DIAGNOSIS — E7800 Pure hypercholesterolemia, unspecified: Secondary | ICD-10-CM | POA: Diagnosis not present

## 2019-04-27 DIAGNOSIS — N289 Disorder of kidney and ureter, unspecified: Secondary | ICD-10-CM | POA: Diagnosis not present

## 2019-04-27 LAB — BASIC METABOLIC PANEL
BUN: 44 — AB (ref 4–21)
Creatinine: 3.8 — AB (ref 0.5–1.1)
Potassium: 6.4 — AB (ref 3.4–5.3)

## 2019-04-27 LAB — HEMOGLOBIN A1C: Hemoglobin A1C: 12.5

## 2019-04-27 LAB — MICROALBUMIN, URINE: Microalb, Ur: 175

## 2019-04-27 LAB — TSH: TSH: 2.98 (ref 0.41–5.90)

## 2019-04-27 LAB — VITAMIN D 25 HYDROXY (VIT D DEFICIENCY, FRACTURES): Vit D, 25-Hydroxy: 4.6

## 2019-04-28 LAB — BASIC METABOLIC PANEL
BUN: 36 — AB (ref 4–21)
Potassium: 4.8 (ref 3.4–5.3)

## 2019-04-30 ENCOUNTER — Other Ambulatory Visit: Payer: Self-pay | Admitting: "Endocrinology

## 2019-04-30 NOTE — Telephone Encounter (Signed)
Pt is requesting this refill before appt tomorrow. I advised patient several times she had to be seen before refill.

## 2019-05-01 ENCOUNTER — Other Ambulatory Visit: Payer: Self-pay

## 2019-05-01 ENCOUNTER — Encounter: Payer: Self-pay | Admitting: "Endocrinology

## 2019-05-01 ENCOUNTER — Other Ambulatory Visit: Payer: Self-pay | Admitting: "Endocrinology

## 2019-05-01 ENCOUNTER — Ambulatory Visit (INDEPENDENT_AMBULATORY_CARE_PROVIDER_SITE_OTHER): Payer: Medicare Other | Admitting: "Endocrinology

## 2019-05-01 ENCOUNTER — Telehealth: Payer: Self-pay

## 2019-05-01 VITALS — BP 176/83 | HR 78 | Ht 68.0 in | Wt 275.2 lb

## 2019-05-01 DIAGNOSIS — E1069 Type 1 diabetes mellitus with other specified complication: Secondary | ICD-10-CM | POA: Insufficient documentation

## 2019-05-01 DIAGNOSIS — E1022 Type 1 diabetes mellitus with diabetic chronic kidney disease: Secondary | ICD-10-CM | POA: Diagnosis not present

## 2019-05-01 DIAGNOSIS — I10 Essential (primary) hypertension: Secondary | ICD-10-CM | POA: Diagnosis not present

## 2019-05-01 DIAGNOSIS — E559 Vitamin D deficiency, unspecified: Secondary | ICD-10-CM | POA: Diagnosis not present

## 2019-05-01 DIAGNOSIS — Z91199 Patient's noncompliance with other medical treatment and regimen due to unspecified reason: Secondary | ICD-10-CM | POA: Insufficient documentation

## 2019-05-01 DIAGNOSIS — E782 Mixed hyperlipidemia: Secondary | ICD-10-CM | POA: Insufficient documentation

## 2019-05-01 DIAGNOSIS — Z9119 Patient's noncompliance with other medical treatment and regimen: Secondary | ICD-10-CM | POA: Diagnosis not present

## 2019-05-01 DIAGNOSIS — E875 Hyperkalemia: Secondary | ICD-10-CM | POA: Insufficient documentation

## 2019-05-01 DIAGNOSIS — E108 Type 1 diabetes mellitus with unspecified complications: Secondary | ICD-10-CM | POA: Insufficient documentation

## 2019-05-01 DIAGNOSIS — N185 Chronic kidney disease, stage 5: Secondary | ICD-10-CM

## 2019-05-01 DIAGNOSIS — E1159 Type 2 diabetes mellitus with other circulatory complications: Secondary | ICD-10-CM | POA: Insufficient documentation

## 2019-05-01 MED ORDER — FREESTYLE LIBRE 14 DAY SENSOR MISC: 1.0000 | 2 refills | Status: DC

## 2019-05-01 MED ORDER — LANTUS SOLOSTAR 100 UNIT/ML ~~LOC~~ SOPN: PEN_INJECTOR | SUBCUTANEOUS | 3 refills | Status: DC

## 2019-05-01 MED ORDER — VITAMIN D (ERGOCALCIFEROL) 1.25 MG (50000 UNIT) PO CAPS: 50000.0000 [IU] | ORAL_CAPSULE | ORAL | 0 refills | Status: DC

## 2019-05-01 MED ORDER — FREESTYLE LIBRE 14 DAY READER DEVI: 1.0000 | Freq: Once | 0 refills | Status: AC

## 2019-05-01 MED ORDER — INSULIN LISPRO (1 UNIT DIAL) 100 UNIT/ML (KWIKPEN): PEN_INJECTOR | SUBCUTANEOUS | 0 refills | Status: DC

## 2019-05-01 NOTE — Progress Notes (Signed)
05/01/2019, 1:06 PM  Endocrinology follow-up note   Subjective:    Patient ID: Sally Porter, female    DOB: 03/12/1980.  Berenice Fleeman is being seen in consultation for management of currently uncontrolled symptomatic diabetes requested by  Bridget Hartshorn, NP.   Past Medical History:  Diagnosis Date  . Diabetes mellitus without complication ([place])   . Diabetes mellitus, type II ([place])   . Glaucoma   . Hyperlipidemia   . Hypertension     History reviewed. No pertinent surgical history.  Social History   Socioeconomic History  . Marital status: Single    Spouse name: Not on file  . Number of children: Not on file  . Years of education: Not on file  . Highest education level: Not on file  Occupational History  . Not on file  Tobacco Use  . Smoking status: Never Smoker  . Smokeless tobacco: Never Used  Substance and Sexual Activity  . Alcohol use: Not Currently  . Drug use: Never  . Sexual activity: Not on file  Other Topics Concern  . Not on file  Social History Narrative  . Not on file   Social Determinants of Health   Financial Resource Strain:   . Difficulty of Paying Living Expenses: Not on file  Food Insecurity:   . Worried About Charity fundraiser in the Last Year: Not on file  . Ran Out of Food in the Last Year: Not on file  Transportation Needs:   . Lack of Transportation (Medical): Not on file  . Lack of Transportation (Non-Medical): Not on file  Physical Activity:   . Days of Exercise per Week: Not on file  . Minutes of Exercise per Session: Not on file  Stress:   . Feeling of Stress : Not on file  Social Connections:   . Frequency of Communication with Friends and Family: Not on file  . Frequency of Social Gatherings with Friends and Family: Not on file  . Attends Religious Services: Not on file  . Active Member of Clubs or Organizations: Not on file  . Attends Archivist Meetings: Not on file  .  Marital Status: Not on file    History reviewed. No pertinent family history.  Outpatient Encounter Medications as of 05/01/2019  Medication Sig  . insulin lispro (HUMALOG) 100 UNIT/ML KwikPen Inject 5-11 Units into the skin 3 (three) times daily before meals.  . ALPRAZolam (XANAX) 0.5 MG tablet 1-2 tablets at bedtime.  Marland Kitchen atenolol (TENORMIN) 50 MG tablet TAKE 1 TABLET BY MOUTH DAILY  . atorvastatin (LIPITOR) 20 MG tablet daily.  . calcitonin, salmon, (MIACALCIN/FORTICAL) 200 UNIT/ACT nasal spray USE ONE SPRAY in one nostril EVERY DAY (alternating nostrils)  . Continuous Blood Gluc Receiver (FREESTYLE LIBRE 14 DAY READER) DEVI 1 each by Does not apply route once for 1 dose.  . Continuous Blood Gluc Sensor (FREESTYLE LIBRE 14 DAY SENSOR) MISC Inject 1 each into the skin every 14 (fourteen) days. Use as directed.  . ferrous sulfate 325 (65 FE) MG tablet Take 325 mg by mouth daily with breakfast.  . HUMALOG KWIKPEN 100 UNIT/ML KwikPen INJECT 5-11 UNITS TOTAL INTO THE SKIN THREE TIMES DAILY BEFORE MEALS  . Insulin Glargine (LANTUS SOLOSTAR) 100 UNIT/ML Solostar Pen INJECT 40 UNITS INTO THE SKIN AT BEDTIME  . lidocaine (LIDODERM) 5 % APPLY ONE PATCH TO LOWER BACK FOR 12 HOURS AND THEN OFF FOR 12 HOURS  . lisinopril (ZESTRIL) 10  MG tablet TAKE 1 TABLET BY MOUTH DAILY  . Multiple Vitamins-Minerals (ZINC PO) Take by mouth daily.  . nortriptyline (PAMELOR) 10 MG capsule 2 (two) times a day.  . oxyCODONE-acetaminophen (PERCOCET) 10-325 MG tablet every 6 (six) hours as needed.  Marland Kitchen rOPINIRole (REQUIP) 0.5 MG tablet at bedtime.  . sodium bicarbonate 650 MG tablet Take 1,300 mg by mouth 2 (two) times daily.  . Vitamin D, Ergocalciferol, (DRISDOL) 1.25 MG (50000 UNIT) CAPS capsule Take 1 capsule (50,000 Units total) by mouth every 7 (seven) days.  . [DISCONTINUED] albuterol (ACCUNEB) 0.63 MG/3ML nebulizer solution Take 1 ampule by nebulization every 6 (six) hours as needed.  . [DISCONTINUED] benazepril  (LOTENSIN) 10 MG tablet Take 10 mg by mouth daily.  . [DISCONTINUED] colchicine 0.6 MG tablet Take 0.6 mg by mouth daily.  . [DISCONTINUED] Febuxostat (ULORIC) 80 MG TABS Take 1 tablet by mouth daily.  . [DISCONTINUED] HYDROcodone-acetaminophen (NORCO) 7.5-325 MG per tablet Take 1 tablet by mouth every 6 (six) hours as needed for moderate pain.  . [DISCONTINUED] insulin aspart (NOVOLOG FLEXPEN) 100 UNIT/ML FlexPen Inject 5-11 Units into the skin 3 (three) times daily before meals.  . [DISCONTINUED] LANTUS SOLOSTAR 100 UNIT/ML Solostar Pen INJECT 34 UNITS INTO THE SKIN AT BEDTIME  . [DISCONTINUED] lovastatin (MEVACOR) 10 MG tablet Take 10 mg by mouth at bedtime.  . [DISCONTINUED] methylPREDNISolone (MEDROL DOSEPAK) 4 MG tablet Take 4 mg by mouth. follow package directions  . [DISCONTINUED] Vitamin D, Ergocalciferol, (DRISDOL) 1.25 MG (50000 UT) CAPS capsule Take 1 capsule (50,000 Units total) by mouth every 7 (seven) days.   No facility-administered encounter medications on file as of 05/01/2019.    ALLERGIES: Allergies  Allergen Reactions  . Morphine And Related     VACCINATION STATUS:  There is no immunization history on file for this patient.  Diabetes She presents for her follow-up diabetic visit. She has type 1 diabetes mellitus. Onset time: She was diagnosed at approximate age of 25 years. Her disease course has been worsening (She does not have recent labs, however, her A1c was reportedly 10% in July 2019.). There are no hypoglycemic associated symptoms. Pertinent negatives for hypoglycemia include no confusion, headaches, pallor or seizures. Associated symptoms include blurred vision, fatigue, polydipsia and polyuria. Pertinent negatives for diabetes include no chest pain and no polyphagia. There are no hypoglycemic complications. Symptoms are worsening. Diabetic complications include nephropathy and peripheral neuropathy. Risk factors for coronary artery disease include dyslipidemia,  diabetes mellitus, family history, hypertension, obesity, sedentary lifestyle and tobacco exposure. Current diabetic treatment includes insulin injections. Her weight is fluctuating minimally. She is following a generally unhealthy diet. When asked about meal planning, she reported none. She has not had a previous visit with a dietitian. She rarely participates in exercise. (Patient was seen in June 2020, was supposed to return in 3 weeks after that visit.  Unfortunately she never returned for follow-up.  Recently she was found to have extremely high A1c of 12.5% increasing from 8.5% associated with worsening renal function and hyperkalemia.  This necessitated ER visit for correction of hyperkalemia in [facility].  Her potassium now is 4.8.  She is still returns with no monitor nor logs.  ) An ACE inhibitor/angiotensin II receptor blocker is being taken. She sees a podiatrist.Eye exam is current.  Hypertension This is a chronic problem. The current episode started more than 1 year ago. Associated symptoms include blurred vision. Pertinent negatives include no chest pain, headaches, palpitations or shortness of breath.  Risk factors for coronary artery disease include dyslipidemia, diabetes mellitus, obesity, sedentary lifestyle and smoking/tobacco exposure. Past treatments include ACE inhibitors and beta blockers. Hypertensive end-organ damage includes kidney disease.  Hyperlipidemia Pertinent negatives include no chest pain, myalgias or shortness of breath.    Review of Systems  Constitutional: Positive for fatigue. Negative for chills, fever and unexpected weight change.  HENT: Negative for trouble swallowing and voice change.   Eyes: Positive for blurred vision. Negative for visual disturbance.  Respiratory: Negative for cough, shortness of breath and wheezing.   Cardiovascular: Negative for chest pain, palpitations and leg swelling.  Gastrointestinal: Negative for diarrhea, nausea  and vomiting.  Endocrine: Positive for polydipsia and polyuria. Negative for cold intolerance, heat intolerance and polyphagia.  Musculoskeletal: Negative for arthralgias and myalgias.  Skin: Negative for color change, pallor, rash and wound.  Neurological: Negative for seizures and headaches.  Psychiatric/Behavioral: Negative for confusion and suicidal ideas.    Objective:    BP (!) 176/83   Pulse 78   Ht 5\' 8"  (1.727 m)   Wt 275 lb 3.2 oz (124.8 kg)   BMI 41.84 kg/m   Wt Readings from Last 3 Encounters:  05/01/19 275 lb 3.2 oz (124.8 kg)  08/10/18 271 lb (122.9 kg)     Physical Exam Constitutional:      Appearance: She is well-developed.  HENT:     Head: Normocephalic and atraumatic.  Neck:     Thyroid: No thyromegaly.     Trachea: No tracheal deviation.  Cardiovascular:     Rate and Rhythm: Normal rate and regular rhythm.  Pulmonary:     Effort: Pulmonary effort is normal.     Breath sounds: Normal breath sounds.  Abdominal:     General: Bowel sounds are normal.     Tenderness: There is no abdominal tenderness. There is no guarding.  Musculoskeletal:        General: Normal range of motion.     Cervical back: Normal range of motion and neck supple.  Skin:    General: Skin is warm and dry.     Coloration: Skin is not pale.     Findings: No erythema or rash.  Neurological:     Mental Status: She is alert and oriented to person, place, and time.     Cranial Nerves: No cranial nerve deficit.     Coordination: Coordination normal.     Deep Tendon Reflexes: Reflexes are normal and symmetric.  Psychiatric:        Judgment: Judgment normal.      CMP ( most recent) CMP     Component Value Date/Time   NA 139 08/10/2018 1013   K 4.8 04/28/2019 0000   CL 112 (H) 08/10/2018 1013   CO2 15 (L) 08/10/2018 1013   GLUCOSE 99 08/10/2018 1013   BUN 36 (A) 04/28/2019 0000   CREATININE 3.8 (A) 04/27/2019 0000   CREATININE 2.83 (H) 08/10/2018 1013   CALCIUM 8.7  08/10/2018 1013   PROT 7.2 08/10/2018 1013   ALBUMIN 2.9 (L) 11/02/2015 0815   AST 17 08/10/2018 1013   ALT 9 08/10/2018 1013   ALKPHOS 73 11/02/2015 0815   BILITOT 0.2 08/10/2018 1013   GFRNONAA 20 (L) 08/10/2018 1013   GFRAA 24 (L) 08/10/2018 1013    Recent Results (from the past 2160 hour(s))  Microalbumin, urine     Status: None   Collection Time: 04/27/19 12:00 AM  Result Value Ref Range   Microalb, Ur 175   VITAMIN  D 25 Hydroxy (Vit-D Deficiency, Fractures)     Status: None   Collection Time: 04/27/19 12:00 AM  Result Value Ref Range   Vit D, 25-Hydroxy 4.6   Basic metabolic panel     Status: Abnormal   Collection Time: 04/27/19 12:00 AM  Result Value Ref Range   BUN 44 (A) 4 - 21   Creatinine 3.8 (A) 0.5 - 1.1   Potassium 6.4 (A) 3.4 - 5.3  Hemoglobin A1c     Status: None   Collection Time: 04/27/19 12:00 AM  Result Value Ref Range   Hemoglobin A1C 12.5   TSH     Status: None   Collection Time: 04/27/19 12:00 AM  Result Value Ref Range   TSH 2.98 0.41 - 5.90    Comment: t t4  6.1  Basic metabolic panel     Status: Abnormal   Collection Time: 04/28/19 12:00 AM  Result Value Ref Range   BUN 36 (A) 4 - 21   Potassium 4.8 3.4 - 5.3     Assessment & Plan:   1. Type 1 diabetes mellitus with diabetic chronic kidney disease, unspecified CKD stage ([place])  - Leighanna Varnell has currently uncontrolled symptomatic type 2 DM since  39 years of age.  Patient was seen in June 2020, was supposed to return in 3 weeks after that visit.  Unfortunately she never returned for follow-up.  Recently she was found to have extremely high A1c of 12.5% increasing from 8.5% associated with worsening renal function and hyperkalemia.  This necessitated ER visit for correction of hyperkalemia in [facility].  Her potassium now is 4.8.  She is still returns with no monitor nor logs.    -Patient needs to reengage in self-care. -Once again, I had a long discussion with her  about the progressive nature of diabetes and the pathology behind its complications. -her diabetes is complicated by history of recurrent DKA, end-stage renal disease with hyperkalemia  and she remains at a high risk for more acute and chronic complications which include CAD, CVA, CKD, retinopathy, and neuropathy. These are all discussed in detail with her.  - I have counseled her on diet management and weight loss, by adopting a carbohydrate restricted/protein rich diet.   - she  admits there is a room for improvement in her diet and drink choices. -  Suggestion is made for her to avoid simple carbohydrates  from her diet including Cakes, Sweet Desserts / Pastries, Ice Cream, Soda (diet and regular), Sweet Tea, Candies, Chips, Cookies, Sweet Pastries,  Store Bought Juices, Alcohol in Excess of  1-2 drinks a day, Artificial Sweeteners, Coffee Creamer, and "Sugar-free" Products. This will help patient to have stable blood glucose profile and potentially avoid unintended weight gain.  - I encouraged her to switch to  unprocessed or minimally processed complex starch and increased protein intake (animal or plant source), fruits, and vegetables.  - she is advised to stick to a routine mealtimes to eat 3 meals  a day and avoid unnecessary snacks ( to snack only to correct hypoglycemia).   - she will be scheduled with Jearld Fenton, RDN, CDE for individualized diabetes education.  - I have approached her with the following individualized plan to manage diabetes and patient agrees:   - she will continue to need intensive treatment with basal/bolus insulin in order for her to achieve and maintain control of diabetes to target. -The most immediate care she needs is renal care.  I wrote  a note and attached her recent labs and sent her to [facility] for her to reestablish care and follow-up.  She is in need of interventions such as hemodialysis to prevent further emergencies of hyperkalemia.  Her GFR  is 16 dropping from 32 during her last visit.  -Regarding her diabetes, she is advised to increase her Lantus to 40 units  daily at bedtime , and continue Humalog  5 units 3 times a day with meals  for pre-meal BG readings of 70-150mg /dl, plus patient specific correction dose for unexpected hyperglycemia above 150mg /dl, associated with strict monitoring of glucose 4 times a day-before meals and at bedtime.  - she is warned not to take insulin without proper monitoring per orders. - Adjustment parameters are given to her for hypo and hyperglycemia in writing. - she is encouraged to call clinic for blood glucose levels less than 70 or above 300 mg /dl. Patient would benefit from a continuous glucose monitoring device.  I discussed and ordered the freestyle libre CGM device for her.  - Patient specific target  A1c;  LDL, HDL, Triglycerides,  were discussed in detail.  2) Blood Pressure /Hypertension:   Her blood pressure is not controlled to target.  She admits that she has not been consistent taking her lisinopril.   she is advised to continue her current medications including lisinopril 10 mg once a day, atenolol 50 mg p.o. daily with breakfast .  3) Lipids/Hyperlipidemia: She does not have recent lipid panel to review.  She is advised to continue atorvastatin 20 mg p.o. nightly.  Side effects and precautions discussed with her.  4)  Weight/Diet:  Body mass index is 41.84 kg/m.  -   clearly complicating her diabetes care.  She is a candidate for modest weight loss.  I discussed with her the fact that loss of 5 - 10% of her  current body weight will have the most impact on her diabetes management.  CDE Consult will be initiated . Exercise, and detailed carbohydrates information provided  -  detailed on discharge instructions.   5) hyperkalemia-related to CKD/ESRD.  This was corrected from 6.4-4.8 after her recent emergency room.  She will need to reestablish her renal care  immediately.  She is  advised not to go home before she gets appointment in nephrology clinic.  She is sent with a note in her recent labs to make in person visit to the clinic.  6) Chronic Care/Health Maintenance:  -she  is on ACEI/ARB and Statin medications and  is encouraged to initiate and continue to follow up with Ophthalmology, Dentist,  Podiatrist at least yearly or according to recommendations, and advised to  stay away from smoking. I have recommended yearly flu vaccine and pneumonia vaccine at least every 5 years; moderate intensity exercise for up to 150 minutes weekly; and  sleep for at least 7 hours a day.  - she is  advised to maintain close follow up with [place], Karie Schwalbe, NP for primary care needs, as well as her other providers for optimal and coordinated care.  - Time spent on this patient care encounter:  45 min, of which >50% was spent in  counseling and the rest reviewing her  current and  previous labs/studies ( including abstraction from other facilities),  previous treatments, her blood glucose readings, and medications' doses and developing a plan for long-term care based on the latest recommendations for standards of care; and documenting her care.  Anelle Simson participated in the discussions,  expressed understanding, and voiced agreement with the above plans.  All questions were answered to her satisfaction. she is encouraged to contact clinic should she have any questions or concerns prior to her return visit.   Follow up plan: - Return in about 10 days (around 05/11/2019) for Follow up with [facility] and Logs Only - no Labs.  Glade Lloyd, MD [facility] [address] [place], [place] [address] Phone: [phone]  Fax: [phone]    05/01/2019, 1:06 PM  This note was partially dictated with voice recognition software. Similar sounding words can be transcribed inadequately or may not  be corrected upon review.

## 2019-05-01 NOTE — Telephone Encounter (Signed)
Rx refill for humalog sent to [facility].

## 2019-05-01 NOTE — Telephone Encounter (Signed)
PT needs her Humalog called into [facility] she is out, and that was not deliverd

## 2019-05-01 NOTE — Patient Instructions (Signed)

## 2019-05-11 ENCOUNTER — Ambulatory Visit (INDEPENDENT_AMBULATORY_CARE_PROVIDER_SITE_OTHER): Payer: Medicare Other | Admitting: "Endocrinology

## 2019-05-11 ENCOUNTER — Encounter: Payer: Self-pay | Admitting: "Endocrinology

## 2019-05-11 ENCOUNTER — Other Ambulatory Visit: Payer: Self-pay

## 2019-05-11 VITALS — BP 165/92 | HR 75 | Ht 68.0 in | Wt 283.2 lb

## 2019-05-11 DIAGNOSIS — N185 Chronic kidney disease, stage 5: Secondary | ICD-10-CM | POA: Diagnosis not present

## 2019-05-11 DIAGNOSIS — E559 Vitamin D deficiency, unspecified: Secondary | ICD-10-CM | POA: Diagnosis not present

## 2019-05-11 DIAGNOSIS — E782 Mixed hyperlipidemia: Secondary | ICD-10-CM | POA: Diagnosis not present

## 2019-05-11 DIAGNOSIS — I10 Essential (primary) hypertension: Secondary | ICD-10-CM | POA: Diagnosis not present

## 2019-05-11 DIAGNOSIS — E1022 Type 1 diabetes mellitus with diabetic chronic kidney disease: Secondary | ICD-10-CM

## 2019-05-11 MED ORDER — LISINOPRIL 30 MG PO TABS: 30.0000 mg | ORAL_TABLET | Freq: Every day | ORAL | 1 refills | Status: DC

## 2019-05-11 NOTE — Patient Instructions (Signed)

## 2019-05-11 NOTE — Progress Notes (Signed)
05/11/2019, 1:39 PM  Endocrinology follow-up note   Subjective:    Patient ID: Sally Porter, female    DOB: 06-26-1980.  Aryan Peggs is being seen in follow-up after she was seen in consultation for management of currently uncontrolled symptomatic diabetes complicated by stage 4-5  renal disease. Requested by  Bridget Hartshorn, NP.   Past Medical History:  Diagnosis Date  . Diabetes mellitus without complication ([place])   . Diabetes mellitus, type II ([place])   . Glaucoma   . Hyperlipidemia   . Hypertension     History reviewed. No pertinent surgical history.  Social History   Socioeconomic History  . Marital status: Single    Spouse name: Not on file  . Number of children: Not on file  . Years of education: Not on file  . Highest education level: Not on file  Occupational History  . Not on file  Tobacco Use  . Smoking status: Never Smoker  . Smokeless tobacco: Never Used  Substance and Sexual Activity  . Alcohol use: Not Currently  . Drug use: Never  . Sexual activity: Not on file  Other Topics Concern  . Not on file  Social History Narrative  . Not on file   Social Determinants of Health   Financial Resource Strain:   . Difficulty of Paying Living Expenses:   Food Insecurity:   . Worried About Charity fundraiser in the Last Year:   . Arboriculturist in the Last Year:   Transportation Needs:   . Film/video editor (Medical):   Marland Kitchen Lack of Transportation (Non-Medical):   Physical Activity:   . Days of Exercise per Week:   . Minutes of Exercise per Session:   Stress:   . Feeling of Stress :   Social Connections:   . Frequency of Communication with Friends and Family:   . Frequency of Social Gatherings with Friends and Family:   . Attends Religious Services:   . Active Member of Clubs or Organizations:   . Attends Archivist Meetings:   Marland Kitchen Marital Status:     History reviewed. No pertinent family  history.  Outpatient Encounter Medications as of 05/11/2019  Medication Sig  . ALPRAZolam (XANAX) 0.5 MG tablet 1-2 tablets at bedtime.  Marland Kitchen atenolol (TENORMIN) 50 MG tablet TAKE 1 TABLET BY MOUTH DAILY  . atorvastatin (LIPITOR) 20 MG tablet daily.  . calcitonin, salmon, (MIACALCIN/FORTICAL) 200 UNIT/ACT nasal spray USE ONE SPRAY in one nostril EVERY DAY (alternating nostrils)  . Continuous Blood Gluc Sensor (FREESTYLE LIBRE 14 DAY SENSOR) MISC Inject 1 each into the skin every 14 (fourteen) days. Use as directed.  . ferrous sulfate 325 (65 FE) MG tablet Take 325 mg by mouth daily with breakfast.  . Insulin Glargine (LANTUS SOLOSTAR) 100 UNIT/ML Solostar Pen INJECT 40 UNITS INTO THE SKIN AT BEDTIME  . insulin lispro (HUMALOG KWIKPEN) 100 UNIT/ML KwikPen INJECT 5-11 UNITS TOTAL INTO THE SKIN THREE TIMES DAILY BEFORE MEALS  . insulin lispro (HUMALOG) 100 UNIT/ML KwikPen Inject 5-11 Units into the skin 3 (three) times daily before meals.  . lidocaine (LIDODERM) 5 % APPLY ONE PATCH TO LOWER BACK FOR 12 HOURS AND THEN OFF FOR 12 HOURS  . lisinopril (ZESTRIL) 30 MG tablet Take 1 tablet (30 mg total) by mouth daily.  . Multiple Vitamins-Minerals (ZINC PO) Take by mouth daily.  . nortriptyline (PAMELOR) 10 MG capsule 2 (two) times a day.  . oxyCODONE-acetaminophen (  PERCOCET) 10-325 MG tablet every 6 (six) hours as needed.  Marland Kitchen rOPINIRole (REQUIP) 0.5 MG tablet at bedtime.  . sodium bicarbonate 650 MG tablet Take 1,300 mg by mouth 2 (two) times daily.  . Vitamin D, Ergocalciferol, (DRISDOL) 1.25 MG (50000 UNIT) CAPS capsule Take 1 capsule (50,000 Units total) by mouth every 7 (seven) days.  . [DISCONTINUED] lisinopril (ZESTRIL) 10 MG tablet TAKE 1 TABLET BY MOUTH DAILY   No facility-administered encounter medications on file as of 05/11/2019.    ALLERGIES: Allergies  Allergen Reactions  . Morphine And Related     VACCINATION STATUS:  There is no immunization history on file for this  patient.  Diabetes She presents for her follow-up diabetic visit. She has type 1 diabetes mellitus. Onset time: She was diagnosed at approximate age of 39 years. Her disease course has been improving (She does not have recent labs, however, her A1c was reportedly 10% in July 2019.). There are no hypoglycemic associated symptoms. Pertinent negatives for hypoglycemia include no confusion, headaches, pallor or seizures. Associated symptoms include blurred vision and fatigue. Pertinent negatives for diabetes include no chest pain, no polydipsia and no polyphagia. There are no hypoglycemic complications. Symptoms are improving. Diabetic complications include nephropathy and peripheral neuropathy. Risk factors for coronary artery disease include dyslipidemia, diabetes mellitus, family history, hypertension, obesity, sedentary lifestyle and tobacco exposure. Current diabetic treatment includes insulin injections. Her weight is increasing steadily. She is following a generally unhealthy diet. When asked about meal planning, she reported none. She has not had a previous visit with a dietitian. She rarely participates in exercise. Her home blood glucose trend is decreasing steadily. Her breakfast blood glucose range is generally 130-140 mg/dl. Her lunch blood glucose range is generally 130-140 mg/dl. Her dinner blood glucose range is generally 130-140 mg/dl. Her bedtime blood glucose range is generally 130-140 mg/dl. Her overall blood glucose range is 130-140 mg/dl. An ACE inhibitor/angiotensin II receptor blocker is being taken. She sees a podiatrist.Eye exam is current.  Hypertension This is a chronic problem. The current episode started more than 1 year ago. Associated symptoms include blurred vision. Pertinent negatives include no chest pain, headaches, palpitations or shortness of breath. Risk factors for coronary artery disease include dyslipidemia, diabetes mellitus, obesity, sedentary lifestyle and  smoking/tobacco exposure. Past treatments include ACE inhibitors and beta blockers. Hypertensive end-organ damage includes kidney disease.  Hyperlipidemia Pertinent negatives include no chest pain, myalgias or shortness of breath.    Review of Systems  Constitutional: Positive for fatigue. Negative for chills, fever and unexpected weight change.  HENT: Negative for trouble swallowing and voice change.   Eyes: Positive for blurred vision. Negative for visual disturbance.  Respiratory: Negative for cough, shortness of breath and wheezing.   Cardiovascular: Negative for chest pain, palpitations and leg swelling.  Gastrointestinal: Negative for diarrhea, nausea and vomiting.  Endocrine: Negative for cold intolerance, heat intolerance, polydipsia and polyphagia.  Musculoskeletal: Negative for arthralgias and myalgias.  Skin: Negative for color change, pallor, rash and wound.  Neurological: Negative for seizures and headaches.  Psychiatric/Behavioral: Negative for confusion and suicidal ideas.    Objective:    BP (!) 165/92   Pulse 75   Ht 5\' 8"  (1.727 m)   Wt 283 lb 3.2 oz (128.5 kg)   BMI 43.06 kg/m   Wt Readings from Last 3 Encounters:  05/11/19 283 lb 3.2 oz (128.5 kg)  05/01/19 275 lb 3.2 oz (124.8 kg)  08/10/18 271 lb (122.9 kg)     Physical  Exam Constitutional:      Appearance: She is well-developed.  HENT:     Head: Normocephalic and atraumatic.  Neck:     Thyroid: No thyromegaly.     Trachea: No tracheal deviation.  Cardiovascular:     Rate and Rhythm: Normal rate and regular rhythm.  Pulmonary:     Effort: Pulmonary effort is normal.     Breath sounds: Normal breath sounds.  Abdominal:     General: Bowel sounds are normal.     Tenderness: There is no abdominal tenderness. There is no guarding.  Musculoskeletal:        General: Normal range of motion.     Cervical back: Normal range of motion and neck supple.  Skin:    General: Skin is warm and dry.      Coloration: Skin is not pale.     Findings: No erythema or rash.  Neurological:     Mental Status: She is alert and oriented to person, place, and time.     Cranial Nerves: No cranial nerve deficit.     Coordination: Coordination normal.     Deep Tendon Reflexes: Reflexes are normal and symmetric.  Psychiatric:        Judgment: Judgment normal.      CMP ( most recent) CMP     Component Value Date/Time   NA 139 08/10/2018 1013   K 4.8 04/28/2019 0000   CL 112 (H) 08/10/2018 1013   CO2 15 (L) 08/10/2018 1013   GLUCOSE 99 08/10/2018 1013   BUN 36 (A) 04/28/2019 0000   CREATININE 3.8 (A) 04/27/2019 0000   CREATININE 2.83 (H) 08/10/2018 1013   CALCIUM 8.7 08/10/2018 1013   PROT 7.2 08/10/2018 1013   ALBUMIN 2.9 (L) 11/02/2015 0815   AST 17 08/10/2018 1013   ALT 9 08/10/2018 1013   ALKPHOS 73 11/02/2015 0815   BILITOT 0.2 08/10/2018 1013   GFRNONAA 20 (L) 08/10/2018 1013   GFRAA 24 (L) 08/10/2018 1013    Recent Results (from the past 2160 hour(s))  Microalbumin, urine     Status: None   Collection Time: 04/27/19 12:00 AM  Result Value Ref Range   Microalb, Ur 175   VITAMIN D 25 Hydroxy (Vit-D Deficiency, Fractures)     Status: None   Collection Time: 04/27/19 12:00 AM  Result Value Ref Range   Vit D, 25-Hydroxy 4.6   Basic metabolic panel     Status: Abnormal   Collection Time: 04/27/19 12:00 AM  Result Value Ref Range   BUN 44 (A) 4 - 21   Creatinine 3.8 (A) 0.5 - 1.1   Potassium 6.4 (A) 3.4 - 5.3  Hemoglobin A1c     Status: None   Collection Time: 04/27/19 12:00 AM  Result Value Ref Range   Hemoglobin A1C 12.5   TSH     Status: None   Collection Time: 04/27/19 12:00 AM  Result Value Ref Range   TSH 2.98 0.41 - 5.90    Comment: t t4  6.1  Basic metabolic panel     Status: Abnormal   Collection Time: 04/28/19 12:00 AM  Result Value Ref Range   BUN 36 (A) 4 - 21   Potassium 4.8 3.4 - 5.3     Assessment & Plan:   1. Type 1 diabetes mellitus with diabetic  chronic kidney disease, unspecified CKD stage ([place])  - Chantea Woehl has currently uncontrolled symptomatic type 2 DM since  39 years of age. She returns with near  target glycemic profile, after resuming her usual basal/bolus insulin regimen. -her diabetes is complicated by history of recurrent DKA, end-stage renal disease with hyperkalemia  and she remains at a high risk for more acute and chronic complications which include CAD, CVA, CKD, retinopathy, and neuropathy. These are all discussed in detail with her.  - I have counseled her on diet management and weight loss, by adopting a carbohydrate restricted/protein rich diet.   - she  admits there is a room for improvement in her diet and drink choices. -  Suggestion is made for her to avoid simple carbohydrates  from her diet including Cakes, Sweet Desserts / Pastries, Ice Cream, Soda (diet and regular), Sweet Tea, Candies, Chips, Cookies, Sweet Pastries,  Store Bought Juices, Alcohol in Excess of  1-2 drinks a day, Artificial Sweeteners, Coffee Creamer, and "Sugar-free" Products. This will help patient to have stable blood glucose profile and potentially avoid unintended weight gain.   - I encouraged her to switch to  unprocessed or minimally processed complex starch and increased protein intake (animal or plant source), fruits, and vegetables.  - she is advised to stick to a routine mealtimes to eat 3 meals  a day and avoid unnecessary snacks ( to snack only to correct hypoglycemia).   - she will be scheduled with Jearld Fenton, RDN, CDE for individualized diabetes education.  - I have approached her with the following individualized plan to manage diabetes and patient agrees:   - she will continue to need intensive treatment with basal/bolus insulin in order for her to achieve and maintain control of diabetes to target. -She has an appointment to see her nephrologist next week. -  She is in eminent need of interventions such as  hemodialysis to prevent further emergencies of hyperkalemia.  Her GFR is 16 dropping from 32 during her last visit.  -Regarding her diabetes, she is advised to increase her Lantus to 30 units  daily at bedtime , and continue Humalog  5 units 3 times a day with meals  for pre-meal BG readings of 70-150mg /dl, plus patient specific correction dose for unexpected hyperglycemia above 150mg /dl, associated with strict monitoring of glucose 4 times a day-before meals and at bedtime.  - she is warned not to take insulin without proper monitoring per orders. - Adjustment parameters are given to her for hypo and hyperglycemia in writing. - she is encouraged to call clinic for blood glucose levels less than 70 or above 300 mg /dl. Patient would benefit from a continuous glucose monitoring device.  I discussed and ordered the freestyle libre CGM device for her.  - Patient specific target  A1c;  LDL, HDL, Triglycerides,  were discussed in detail.  2) Blood Pressure /Hypertension:   -Her blood pressure is not controlled to target.  I discussed and increase her lisinopril to 30 mg p.o. daily.  she is advised to continue her other medications including atenolol 50 mg daily.     3) Lipids/Hyperlipidemia: She does not have recent lipid panel to review.  She is advised to continue atorvastatin 20 mg p.o. nightly.   Side effects and precautions discussed with her.  4)  Weight/Diet:  Body mass index is 43.06 kg/m.  -   clearly complicating her diabetes care.  She is a candidate for modest weight loss.  I discussed with her the fact that loss of 5 - 10% of her  current body weight will have the most impact on her diabetes management.  CDE Consult will be  initiated . Exercise, and detailed carbohydrates information provided  -  detailed on discharge instructions.   5) hyperkalemia-related to CKD/ESRD.  This was corrected from 6.4 to 4.8 after her recent emergency room.  She he is encouraged to keep her appointment with  her nephrologist.    6) Chronic Care/Health Maintenance:  -she  is on ACEI/ARB and Statin medications and  is encouraged to initiate and continue to follow up with Ophthalmology, Dentist,  Podiatrist at least yearly or according to recommendations, and advised to  stay away from smoking. I have recommended yearly flu vaccine and pneumonia vaccine at least every 5 years; moderate intensity exercise for up to 150 minutes weekly; and  sleep for at least 7 hours a day.  - she is  advised to maintain close follow up with [place], Karie Schwalbe, NP for primary care needs, as well as her other providers for optimal and coordinated care.  - Time spent on this patient care encounter:  40 min, of which > 50% was spent in  counseling and the rest reviewing her blood glucose logs , discussing her hypoglycemia and hyperglycemia episodes, reviewing her current and  previous labs / studies  ( including abstraction from other facilities) and medications  doses and developing a  long term treatment plan and documenting her care.   Please refer to Patient Instructions for Blood Glucose Monitoring and Insulin/Medications Dosing Guide"  in media tab for additional information. Please  also refer to " Patient Self Inventory" in the Media  tab for reviewed elements of pertinent patient history.  Izabellah Whitmire participated in the discussions, expressed understanding, and voiced agreement with the above plans.  All questions were answered to her satisfaction. she is encouraged to contact clinic should she have any questions or concerns prior to her return visit.   Follow up plan: - Return in about 3 months (around 08/11/2019) for Bring Meter and Logs- A1c in Office.  Glade Lloyd, MD [facility] [address] [place], [place] [address] Phone: [phone]  Fax: [phone]    05/11/2019, 1:39 PM  This note was partially dictated with voice recognition software.  Similar sounding words can be transcribed inadequately or may not  be corrected upon review.

## 2019-05-16 DIAGNOSIS — Z729 Problem related to lifestyle, unspecified: Secondary | ICD-10-CM | POA: Diagnosis not present

## 2019-05-16 DIAGNOSIS — E1022 Type 1 diabetes mellitus with diabetic chronic kidney disease: Secondary | ICD-10-CM | POA: Diagnosis not present

## 2019-05-16 DIAGNOSIS — N189 Chronic kidney disease, unspecified: Secondary | ICD-10-CM | POA: Diagnosis not present

## 2019-05-16 DIAGNOSIS — I10 Essential (primary) hypertension: Secondary | ICD-10-CM | POA: Diagnosis not present

## 2019-05-16 DIAGNOSIS — Z7289 Other problems related to lifestyle: Secondary | ICD-10-CM | POA: Diagnosis not present

## 2019-05-16 DIAGNOSIS — Z13220 Encounter for screening for lipoid disorders: Secondary | ICD-10-CM | POA: Diagnosis not present

## 2019-05-17 DIAGNOSIS — M545 Low back pain: Secondary | ICD-10-CM | POA: Diagnosis not present

## 2019-05-17 DIAGNOSIS — M25569 Pain in unspecified knee: Secondary | ICD-10-CM | POA: Diagnosis not present

## 2019-05-17 DIAGNOSIS — Z79891 Long term (current) use of opiate analgesic: Secondary | ICD-10-CM | POA: Diagnosis not present

## 2019-05-17 DIAGNOSIS — M79606 Pain in leg, unspecified: Secondary | ICD-10-CM | POA: Diagnosis not present

## 2019-05-17 DIAGNOSIS — G2581 Restless legs syndrome: Secondary | ICD-10-CM | POA: Diagnosis not present

## 2019-05-17 DIAGNOSIS — G4700 Insomnia, unspecified: Secondary | ICD-10-CM | POA: Diagnosis not present

## 2019-05-17 DIAGNOSIS — M79672 Pain in left foot: Secondary | ICD-10-CM | POA: Diagnosis not present

## 2019-05-30 ENCOUNTER — Other Ambulatory Visit: Payer: Self-pay | Admitting: "Endocrinology

## 2019-06-05 ENCOUNTER — Other Ambulatory Visit: Payer: Self-pay | Admitting: "Endocrinology

## 2019-06-22 DIAGNOSIS — Z79891 Long term (current) use of opiate analgesic: Secondary | ICD-10-CM | POA: Insufficient documentation

## 2019-06-22 DIAGNOSIS — G4700 Insomnia, unspecified: Secondary | ICD-10-CM | POA: Insufficient documentation

## 2019-06-30 ENCOUNTER — Other Ambulatory Visit: Payer: Self-pay | Admitting: "Endocrinology

## 2019-07-10 ENCOUNTER — Other Ambulatory Visit: Payer: Self-pay | Admitting: "Endocrinology

## 2019-08-08 ENCOUNTER — Telehealth: Payer: Self-pay | Admitting: "Endocrinology

## 2019-08-08 MED ORDER — INSULIN LISPRO (1 UNIT DIAL) 100 UNIT/ML (KWIKPEN): PEN_INJECTOR | SUBCUTANEOUS | 1 refills | Status: DC

## 2019-08-08 NOTE — Telephone Encounter (Signed)
Patient is calling and requesting a refill on the following medication. Patient is also requesting to have refills on the RX as this time it did not.  insulin lispro (HUMALOG KWIKPEN) 100 UNIT/ML KwikPen   [facility], [place] [phone] (Phone) [phone] (Fax)

## 2019-08-08 NOTE — Telephone Encounter (Signed)
Rx refill sent with 1 future refill.

## 2019-08-15 ENCOUNTER — Ambulatory Visit: Payer: Medicare Other | Admitting: "Endocrinology

## 2019-08-15 DIAGNOSIS — M25569 Pain in unspecified knee: Secondary | ICD-10-CM | POA: Diagnosis not present

## 2019-08-15 DIAGNOSIS — M79673 Pain in unspecified foot: Secondary | ICD-10-CM | POA: Diagnosis not present

## 2019-08-15 DIAGNOSIS — Z79891 Long term (current) use of opiate analgesic: Secondary | ICD-10-CM | POA: Diagnosis not present

## 2019-08-15 DIAGNOSIS — M545 Low back pain: Secondary | ICD-10-CM | POA: Diagnosis not present

## 2019-08-15 DIAGNOSIS — G4700 Insomnia, unspecified: Secondary | ICD-10-CM | POA: Diagnosis not present

## 2019-09-27 ENCOUNTER — Telehealth: Payer: Self-pay | Admitting: "Endocrinology

## 2019-10-01 NOTE — Telephone Encounter (Signed)
It is  likely because she lowered it between visits. We have to know her readings , she missed her appointments, may stay on Lantus 30 units, but this patient must return for a  follow up visit.

## 2019-10-01 NOTE — Telephone Encounter (Signed)
Returned call to [place] to confirm that Lantus was to be 30 units at hs.

## 2019-10-22 ENCOUNTER — Other Ambulatory Visit: Payer: Self-pay | Admitting: "Endocrinology

## 2019-11-07 DIAGNOSIS — M545 Low back pain: Secondary | ICD-10-CM | POA: Diagnosis not present

## 2019-11-07 DIAGNOSIS — M79673 Pain in unspecified foot: Secondary | ICD-10-CM | POA: Diagnosis not present

## 2019-11-07 DIAGNOSIS — M25569 Pain in unspecified knee: Secondary | ICD-10-CM | POA: Diagnosis not present

## 2019-11-07 DIAGNOSIS — G4700 Insomnia, unspecified: Secondary | ICD-10-CM | POA: Diagnosis not present

## 2019-11-07 DIAGNOSIS — Z79891 Long term (current) use of opiate analgesic: Secondary | ICD-10-CM | POA: Diagnosis not present

## 2019-11-28 ENCOUNTER — Other Ambulatory Visit: Payer: Self-pay | Admitting: "Endocrinology

## 2019-12-27 ENCOUNTER — Other Ambulatory Visit: Payer: Self-pay | Admitting: "Endocrinology

## 2020-01-07 ENCOUNTER — Other Ambulatory Visit: Payer: Self-pay | Admitting: "Endocrinology

## 2020-01-16 ENCOUNTER — Other Ambulatory Visit: Payer: Self-pay | Admitting: "Endocrinology

## 2020-01-30 DIAGNOSIS — G4700 Insomnia, unspecified: Secondary | ICD-10-CM | POA: Diagnosis not present

## 2020-01-30 DIAGNOSIS — M545 Low back pain, unspecified: Secondary | ICD-10-CM | POA: Diagnosis not present

## 2020-01-30 DIAGNOSIS — M25569 Pain in unspecified knee: Secondary | ICD-10-CM | POA: Diagnosis not present

## 2020-01-30 DIAGNOSIS — Z79891 Long term (current) use of opiate analgesic: Secondary | ICD-10-CM | POA: Diagnosis not present

## 2020-01-30 DIAGNOSIS — M79673 Pain in unspecified foot: Secondary | ICD-10-CM | POA: Diagnosis not present

## 2020-05-13 DIAGNOSIS — M545 Low back pain, unspecified: Secondary | ICD-10-CM | POA: Diagnosis not present

## 2020-05-13 DIAGNOSIS — Z79891 Long term (current) use of opiate analgesic: Secondary | ICD-10-CM | POA: Diagnosis not present

## 2020-05-13 DIAGNOSIS — G2581 Restless legs syndrome: Secondary | ICD-10-CM | POA: Diagnosis not present

## 2020-05-13 DIAGNOSIS — M79673 Pain in unspecified foot: Secondary | ICD-10-CM | POA: Diagnosis not present

## 2020-05-13 DIAGNOSIS — G4700 Insomnia, unspecified: Secondary | ICD-10-CM | POA: Diagnosis not present

## 2020-05-13 DIAGNOSIS — M79606 Pain in leg, unspecified: Secondary | ICD-10-CM | POA: Diagnosis not present

## 2020-05-13 DIAGNOSIS — M25569 Pain in unspecified knee: Secondary | ICD-10-CM | POA: Diagnosis not present

## 2020-05-13 DIAGNOSIS — M459 Ankylosing spondylitis of unspecified sites in spine: Secondary | ICD-10-CM | POA: Diagnosis not present

## 2020-08-12 DIAGNOSIS — M545 Low back pain, unspecified: Secondary | ICD-10-CM | POA: Diagnosis not present

## 2020-08-12 DIAGNOSIS — M79606 Pain in leg, unspecified: Secondary | ICD-10-CM | POA: Diagnosis not present

## 2020-08-12 DIAGNOSIS — M25569 Pain in unspecified knee: Secondary | ICD-10-CM | POA: Diagnosis not present

## 2020-08-12 DIAGNOSIS — M79673 Pain in unspecified foot: Secondary | ICD-10-CM | POA: Diagnosis not present

## 2020-08-12 DIAGNOSIS — G2581 Restless legs syndrome: Secondary | ICD-10-CM | POA: Diagnosis not present

## 2020-08-12 DIAGNOSIS — M459 Ankylosing spondylitis of unspecified sites in spine: Secondary | ICD-10-CM | POA: Diagnosis not present

## 2020-08-12 DIAGNOSIS — G4700 Insomnia, unspecified: Secondary | ICD-10-CM | POA: Diagnosis not present

## 2020-08-12 DIAGNOSIS — Z79891 Long term (current) use of opiate analgesic: Secondary | ICD-10-CM | POA: Diagnosis not present

## 2020-11-04 DIAGNOSIS — M545 Low back pain, unspecified: Secondary | ICD-10-CM | POA: Diagnosis not present

## 2020-11-04 DIAGNOSIS — G4700 Insomnia, unspecified: Secondary | ICD-10-CM | POA: Diagnosis not present

## 2020-11-04 DIAGNOSIS — Z79891 Long term (current) use of opiate analgesic: Secondary | ICD-10-CM | POA: Diagnosis not present

## 2020-11-04 DIAGNOSIS — G2581 Restless legs syndrome: Secondary | ICD-10-CM | POA: Diagnosis not present

## 2020-11-04 DIAGNOSIS — M459 Ankylosing spondylitis of unspecified sites in spine: Secondary | ICD-10-CM | POA: Diagnosis not present

## 2020-11-04 DIAGNOSIS — M79606 Pain in leg, unspecified: Secondary | ICD-10-CM | POA: Diagnosis not present

## 2020-11-04 DIAGNOSIS — M79673 Pain in unspecified foot: Secondary | ICD-10-CM | POA: Diagnosis not present

## 2020-11-04 DIAGNOSIS — M25569 Pain in unspecified knee: Secondary | ICD-10-CM | POA: Diagnosis not present

## 2021-02-06 DIAGNOSIS — M459 Ankylosing spondylitis of unspecified sites in spine: Secondary | ICD-10-CM | POA: Diagnosis not present

## 2021-02-06 DIAGNOSIS — M25569 Pain in unspecified knee: Secondary | ICD-10-CM | POA: Diagnosis not present

## 2021-02-06 DIAGNOSIS — G2581 Restless legs syndrome: Secondary | ICD-10-CM | POA: Diagnosis not present

## 2021-02-06 DIAGNOSIS — M545 Low back pain, unspecified: Secondary | ICD-10-CM | POA: Diagnosis not present

## 2021-02-06 DIAGNOSIS — M79673 Pain in unspecified foot: Secondary | ICD-10-CM | POA: Diagnosis not present

## 2021-02-06 DIAGNOSIS — G4700 Insomnia, unspecified: Secondary | ICD-10-CM | POA: Diagnosis not present

## 2021-02-06 DIAGNOSIS — Z79891 Long term (current) use of opiate analgesic: Secondary | ICD-10-CM | POA: Diagnosis not present

## 2021-02-06 DIAGNOSIS — M79606 Pain in leg, unspecified: Secondary | ICD-10-CM | POA: Diagnosis not present

## 2021-04-07 DIAGNOSIS — M545 Low back pain, unspecified: Secondary | ICD-10-CM | POA: Diagnosis not present

## 2021-04-07 DIAGNOSIS — R252 Cramp and spasm: Secondary | ICD-10-CM | POA: Diagnosis not present

## 2021-04-07 DIAGNOSIS — G2581 Restless legs syndrome: Secondary | ICD-10-CM | POA: Diagnosis not present

## 2021-04-07 DIAGNOSIS — M459 Ankylosing spondylitis of unspecified sites in spine: Secondary | ICD-10-CM | POA: Diagnosis not present

## 2021-04-07 DIAGNOSIS — M79673 Pain in unspecified foot: Secondary | ICD-10-CM | POA: Diagnosis not present

## 2021-04-07 DIAGNOSIS — G4700 Insomnia, unspecified: Secondary | ICD-10-CM | POA: Diagnosis not present

## 2021-04-07 DIAGNOSIS — Z79891 Long term (current) use of opiate analgesic: Secondary | ICD-10-CM | POA: Diagnosis not present

## 2021-06-01 DIAGNOSIS — H5213 Myopia, bilateral: Secondary | ICD-10-CM | POA: Diagnosis not present

## 2021-06-30 DIAGNOSIS — M545 Low back pain, unspecified: Secondary | ICD-10-CM | POA: Diagnosis not present

## 2021-06-30 DIAGNOSIS — M79672 Pain in left foot: Secondary | ICD-10-CM | POA: Diagnosis not present

## 2021-06-30 DIAGNOSIS — G4700 Insomnia, unspecified: Secondary | ICD-10-CM | POA: Diagnosis not present

## 2021-06-30 DIAGNOSIS — M25569 Pain in unspecified knee: Secondary | ICD-10-CM | POA: Diagnosis not present

## 2021-06-30 DIAGNOSIS — Z79891 Long term (current) use of opiate analgesic: Secondary | ICD-10-CM | POA: Diagnosis not present

## 2021-06-30 DIAGNOSIS — G2581 Restless legs syndrome: Secondary | ICD-10-CM | POA: Diagnosis not present

## 2021-08-13 DIAGNOSIS — N179 Acute kidney failure, unspecified: Secondary | ICD-10-CM | POA: Diagnosis not present

## 2021-08-13 DIAGNOSIS — R112 Nausea with vomiting, unspecified: Secondary | ICD-10-CM | POA: Diagnosis not present

## 2021-08-13 DIAGNOSIS — Z888 Allergy status to other drugs, medicaments and biological substances status: Secondary | ICD-10-CM | POA: Diagnosis not present

## 2021-08-13 DIAGNOSIS — E785 Hyperlipidemia, unspecified: Secondary | ICD-10-CM | POA: Diagnosis not present

## 2021-08-13 DIAGNOSIS — E119 Type 2 diabetes mellitus without complications: Secondary | ICD-10-CM | POA: Diagnosis not present

## 2021-08-13 DIAGNOSIS — R6889 Other general symptoms and signs: Secondary | ICD-10-CM | POA: Diagnosis not present

## 2021-08-13 DIAGNOSIS — R1084 Generalized abdominal pain: Secondary | ICD-10-CM | POA: Diagnosis not present

## 2021-08-13 DIAGNOSIS — Z885 Allergy status to narcotic agent status: Secondary | ICD-10-CM | POA: Diagnosis not present

## 2021-08-13 DIAGNOSIS — I10 Essential (primary) hypertension: Secondary | ICD-10-CM | POA: Diagnosis not present

## 2021-08-13 DIAGNOSIS — R109 Unspecified abdominal pain: Secondary | ICD-10-CM | POA: Diagnosis not present

## 2021-08-13 DIAGNOSIS — Z743 Need for continuous supervision: Secondary | ICD-10-CM | POA: Diagnosis not present

## 2021-08-13 DIAGNOSIS — E876 Hypokalemia: Secondary | ICD-10-CM | POA: Diagnosis not present

## 2021-08-13 DIAGNOSIS — Z20822 Contact with and (suspected) exposure to covid-19: Secondary | ICD-10-CM | POA: Diagnosis not present

## 2021-08-13 DIAGNOSIS — Z79899 Other long term (current) drug therapy: Secondary | ICD-10-CM | POA: Diagnosis not present

## 2021-08-13 DIAGNOSIS — Z794 Long term (current) use of insulin: Secondary | ICD-10-CM | POA: Diagnosis not present

## 2021-08-14 ENCOUNTER — Inpatient Hospital Stay (HOSPITAL_COMMUNITY)
Admission: AD | Admit: 2021-08-14 | Discharge: 2021-08-20 | DRG: 628 | Disposition: A | Discharge status: Home / Self Care | Payer: Medicare Other | Source: Other Acute Inpatient Hospital | Attending: Internal Medicine | Admitting: Internal Medicine

## 2021-08-14 DIAGNOSIS — Z79899 Other long term (current) drug therapy: Secondary | ICD-10-CM | POA: Diagnosis not present

## 2021-08-14 DIAGNOSIS — N186 End stage renal disease: Secondary | ICD-10-CM | POA: Diagnosis present

## 2021-08-14 DIAGNOSIS — N184 Chronic kidney disease, stage 4 (severe): Secondary | ICD-10-CM | POA: Diagnosis not present

## 2021-08-14 DIAGNOSIS — K219 Gastro-esophageal reflux disease without esophagitis: Secondary | ICD-10-CM | POA: Diagnosis present

## 2021-08-14 DIAGNOSIS — G894 Chronic pain syndrome: Secondary | ICD-10-CM | POA: Diagnosis present

## 2021-08-14 DIAGNOSIS — Z4901 Encounter for fitting and adjustment of extracorporeal dialysis catheter: Secondary | ICD-10-CM | POA: Diagnosis not present

## 2021-08-14 DIAGNOSIS — H409 Unspecified glaucoma: Secondary | ICD-10-CM | POA: Diagnosis present

## 2021-08-14 DIAGNOSIS — R600 Localized edema: Secondary | ICD-10-CM | POA: Diagnosis not present

## 2021-08-14 DIAGNOSIS — E876 Hypokalemia: Secondary | ICD-10-CM | POA: Diagnosis not present

## 2021-08-14 DIAGNOSIS — E119 Type 2 diabetes mellitus without complications: Secondary | ICD-10-CM | POA: Diagnosis not present

## 2021-08-14 DIAGNOSIS — I10 Essential (primary) hypertension: Secondary | ICD-10-CM

## 2021-08-14 DIAGNOSIS — R112 Nausea with vomiting, unspecified: Secondary | ICD-10-CM

## 2021-08-14 DIAGNOSIS — R1084 Generalized abdominal pain: Secondary | ICD-10-CM | POA: Diagnosis not present

## 2021-08-14 DIAGNOSIS — Z888 Allergy status to other drugs, medicaments and biological substances status: Secondary | ICD-10-CM | POA: Diagnosis not present

## 2021-08-14 DIAGNOSIS — E1122 Type 2 diabetes mellitus with diabetic chronic kidney disease: Secondary | ICD-10-CM | POA: Diagnosis not present

## 2021-08-14 DIAGNOSIS — E872 Acidosis, unspecified: Secondary | ICD-10-CM | POA: Diagnosis not present

## 2021-08-14 DIAGNOSIS — N185 Chronic kidney disease, stage 5: Secondary | ICD-10-CM | POA: Diagnosis not present

## 2021-08-14 DIAGNOSIS — K0889 Other specified disorders of teeth and supporting structures: Secondary | ICD-10-CM | POA: Diagnosis not present

## 2021-08-14 DIAGNOSIS — J449 Chronic obstructive pulmonary disease, unspecified: Secondary | ICD-10-CM | POA: Diagnosis present

## 2021-08-14 DIAGNOSIS — E782 Mixed hyperlipidemia: Secondary | ICD-10-CM | POA: Diagnosis not present

## 2021-08-14 DIAGNOSIS — N179 Acute kidney failure, unspecified: Secondary | ICD-10-CM | POA: Diagnosis not present

## 2021-08-14 DIAGNOSIS — N250 Renal osteodystrophy: Secondary | ICD-10-CM | POA: Diagnosis present

## 2021-08-14 DIAGNOSIS — Z743 Need for continuous supervision: Secondary | ICD-10-CM | POA: Diagnosis not present

## 2021-08-14 DIAGNOSIS — Z91199 Patient's noncompliance with other medical treatment and regimen due to unspecified reason: Secondary | ICD-10-CM

## 2021-08-14 DIAGNOSIS — Z885 Allergy status to narcotic agent status: Secondary | ICD-10-CM | POA: Diagnosis not present

## 2021-08-14 DIAGNOSIS — I120 Hypertensive chronic kidney disease with stage 5 chronic kidney disease or end stage renal disease: Secondary | ICD-10-CM | POA: Diagnosis present

## 2021-08-14 DIAGNOSIS — F1120 Opioid dependence, uncomplicated: Secondary | ICD-10-CM | POA: Diagnosis present

## 2021-08-14 DIAGNOSIS — E785 Hyperlipidemia, unspecified: Secondary | ICD-10-CM | POA: Diagnosis not present

## 2021-08-14 DIAGNOSIS — E1159 Type 2 diabetes mellitus with other circulatory complications: Secondary | ICD-10-CM | POA: Diagnosis present

## 2021-08-14 DIAGNOSIS — Z20822 Contact with and (suspected) exposure to covid-19: Secondary | ICD-10-CM | POA: Diagnosis not present

## 2021-08-14 DIAGNOSIS — E1165 Type 2 diabetes mellitus with hyperglycemia: Secondary | ICD-10-CM | POA: Diagnosis present

## 2021-08-14 DIAGNOSIS — D631 Anemia in chronic kidney disease: Secondary | ICD-10-CM | POA: Diagnosis present

## 2021-08-14 DIAGNOSIS — Z794 Long term (current) use of insulin: Secondary | ICD-10-CM

## 2021-08-14 DIAGNOSIS — Z6841 Body Mass Index (BMI) 40.0 and over, adult: Secondary | ICD-10-CM

## 2021-08-14 DIAGNOSIS — E1129 Type 2 diabetes mellitus with other diabetic kidney complication: Secondary | ICD-10-CM | POA: Diagnosis not present

## 2021-08-14 DIAGNOSIS — K5900 Constipation, unspecified: Secondary | ICD-10-CM | POA: Diagnosis not present

## 2021-08-14 DIAGNOSIS — E1069 Type 1 diabetes mellitus with other specified complication: Secondary | ICD-10-CM | POA: Diagnosis present

## 2021-08-14 DIAGNOSIS — J9601 Acute respiratory failure with hypoxia: Secondary | ICD-10-CM | POA: Diagnosis present

## 2021-08-14 DIAGNOSIS — E8770 Fluid overload, unspecified: Secondary | ICD-10-CM | POA: Diagnosis not present

## 2021-08-14 DIAGNOSIS — G2581 Restless legs syndrome: Secondary | ICD-10-CM | POA: Diagnosis not present

## 2021-08-14 DIAGNOSIS — Z992 Dependence on renal dialysis: Secondary | ICD-10-CM | POA: Diagnosis not present

## 2021-08-14 DIAGNOSIS — N189 Chronic kidney disease, unspecified: Secondary | ICD-10-CM | POA: Diagnosis not present

## 2021-08-14 DIAGNOSIS — R6889 Other general symptoms and signs: Secondary | ICD-10-CM | POA: Diagnosis not present

## 2021-08-14 LAB — CBC
HCT: 25.5 % — ABNORMAL LOW (ref 36.0–46.0)
Hemoglobin: 7.8 g/dL — ABNORMAL LOW (ref 12.0–15.0)
MCH: 23.5 pg — ABNORMAL LOW (ref 26.0–34.0)
MCHC: 30.6 g/dL (ref 30.0–36.0)
MCV: 76.8 fL — ABNORMAL LOW (ref 80.0–100.0)
Platelets: 250 10*3/uL (ref 150–400)
RBC: 3.32 MIL/uL — ABNORMAL LOW (ref 3.87–5.11)
RDW: 17.5 % — ABNORMAL HIGH (ref 11.5–15.5)
WBC: 11.6 10*3/uL — ABNORMAL HIGH (ref 4.0–10.5)
nRBC: 0 % (ref 0.0–0.2)

## 2021-08-14 LAB — HIV ANTIBODY (ROUTINE TESTING W REFLEX): HIV Screen 4th Generation wRfx: NONREACTIVE

## 2021-08-14 LAB — GLUCOSE, CAPILLARY
Glucose-Capillary: 325 mg/dL — ABNORMAL HIGH (ref 70–99)
Glucose-Capillary: 323 mg/dL — ABNORMAL HIGH (ref 70–99)
Glucose-Capillary: 251 mg/dL — ABNORMAL HIGH (ref 70–99)
Glucose-Capillary: 240 mg/dL — ABNORMAL HIGH (ref 70–99)

## 2021-08-14 LAB — HEMOGLOBIN A1C
Hgb A1c MFr Bld: 10 % — ABNORMAL HIGH (ref 4.8–5.6)
Mean Plasma Glucose: 240.3 mg/dL

## 2021-08-14 LAB — RENAL FUNCTION PANEL
Albumin: 2.7 g/dL — ABNORMAL LOW (ref 3.5–5.0)
Anion gap: 22 — ABNORMAL HIGH (ref 5–15)
BUN: 94 mg/dL — ABNORMAL HIGH (ref 6–20)
CO2: 18 mmol/L — ABNORMAL LOW (ref 22–32)
Calcium: 4.2 mg/dL — CL (ref 8.9–10.3)
Chloride: 94 mmol/L — ABNORMAL LOW (ref 98–111)
Creatinine, Ser: 19.37 mg/dL — ABNORMAL HIGH (ref 0.44–1.00)
GFR, Estimated: 2 mL/min — ABNORMAL LOW (ref >60)
Glucose, Bld: 333 mg/dL — ABNORMAL HIGH (ref 70–99)
Phosphorus: 8.4 mg/dL — ABNORMAL HIGH (ref 2.5–4.6)
Potassium: 3.7 mmol/L (ref 3.5–5.1)
Sodium: 134 mmol/L — ABNORMAL LOW (ref 135–145)

## 2021-08-14 LAB — MRSA NEXT GEN BY PCR, NASAL: MRSA by PCR Next Gen: NOT DETECTED

## 2021-08-14 MED ORDER — NORTRIPTYLINE HCL 10 MG PO CAPS
10.0000 mg | ORAL_CAPSULE | Freq: Two times a day (BID) | ORAL | Status: DC
  Administered 2021-08-14 – 2021-08-20 (×12): 10 mg via ORAL
  Filled 2021-08-14 (×19): qty 1

## 2021-08-14 MED ORDER — INSULIN ASPART 100 UNIT/ML IJ SOLN
0.0000 [IU] | Freq: Three times a day (TID) | INTRAMUSCULAR | Status: DC
  Administered 2021-08-14: 7 [IU] via SUBCUTANEOUS
  Administered 2021-08-14: 5 [IU] via SUBCUTANEOUS
  Administered 2021-08-15 (×2): 9 [IU] via SUBCUTANEOUS

## 2021-08-14 MED ORDER — ACETAMINOPHEN 650 MG RE SUPP: 650.0000 mg | Freq: Four times a day (QID) | RECTAL | Status: DC | PRN

## 2021-08-14 MED ORDER — ACETAMINOPHEN 325 MG PO TABS: 650.0000 mg | ORAL_TABLET | Freq: Four times a day (QID) | ORAL | Status: DC | PRN

## 2021-08-14 MED ORDER — HEPARIN SODIUM (PORCINE) 5000 UNIT/ML IJ SOLN
5000.0000 [IU] | Freq: Three times a day (TID) | INTRAMUSCULAR | Status: DC
  Administered 2021-08-14 – 2021-08-20 (×16): 5000 [IU] via SUBCUTANEOUS
  Filled 2021-08-14 (×16): qty 1

## 2021-08-14 MED ORDER — ORAL CARE MOUTH RINSE: 15.0000 mL | OROMUCOSAL | Status: DC | PRN

## 2021-08-14 MED ORDER — OXYCODONE-ACETAMINOPHEN 5-325 MG PO TABS
1.0000 | ORAL_TABLET | Freq: Four times a day (QID) | ORAL | Status: DC | PRN
  Administered 2021-08-14: 1 via ORAL
  Filled 2021-08-14: qty 1

## 2021-08-14 MED ORDER — OXYCODONE-ACETAMINOPHEN 10-325 MG PO TABS: 1.0000 | ORAL_TABLET | Freq: Four times a day (QID) | ORAL | Status: DC | PRN

## 2021-08-14 MED ORDER — OXYCODONE HCL 5 MG PO TABS
5.0000 mg | ORAL_TABLET | Freq: Four times a day (QID) | ORAL | Status: DC | PRN
  Administered 2021-08-14 – 2021-08-15 (×2): 5 mg via ORAL
  Filled 2021-08-14 (×2): qty 1

## 2021-08-14 MED ORDER — ONDANSETRON HCL 4 MG/2ML IJ SOLN
4.0000 mg | Freq: Four times a day (QID) | INTRAMUSCULAR | Status: DC | PRN
  Administered 2021-08-17: 4 mg via INTRAVENOUS
  Filled 2021-08-14: qty 2

## 2021-08-14 MED ORDER — CHLORHEXIDINE GLUCONATE CLOTH 2 % EX PADS
6.0000 | MEDICATED_PAD | Freq: Every day | CUTANEOUS | Status: DC
Start: 2021-08-15 — End: 2021-08-20
  Administered 2021-08-18: 6 via TOPICAL

## 2021-08-14 MED ORDER — ALPRAZOLAM 0.5 MG PO TABS
0.5000 mg | ORAL_TABLET | Freq: Two times a day (BID) | ORAL | Status: DC | PRN
  Administered 2021-08-14 – 2021-08-19 (×4): 0.5 mg via ORAL
  Filled 2021-08-14 (×4): qty 1

## 2021-08-14 MED ORDER — INSULIN ASPART 100 UNIT/ML IJ SOLN
0.0000 [IU] | Freq: Every day | INTRAMUSCULAR | Status: DC
  Administered 2021-08-14: 2 [IU] via SUBCUTANEOUS

## 2021-08-14 MED ORDER — HYDROCORTISONE 1 % EX CREA
TOPICAL_CREAM | Freq: Two times a day (BID) | CUTANEOUS | Status: DC
  Filled 2021-08-14 (×3): qty 28

## 2021-08-14 MED ORDER — ATENOLOL 50 MG PO TABS
50.0000 mg | ORAL_TABLET | Freq: Every day | ORAL | Status: DC
  Administered 2021-08-14 – 2021-08-20 (×5): 50 mg via ORAL
  Filled 2021-08-14: qty 1
  Filled 2021-08-14: qty 2
  Filled 2021-08-14 (×3): qty 1

## 2021-08-14 MED ORDER — ONDANSETRON HCL 4 MG/2ML IJ SOLN
4.0000 mg | Freq: Four times a day (QID) | INTRAMUSCULAR | Status: DC
Start: 2021-08-14 — End: 2021-08-20
  Administered 2021-08-14 – 2021-08-18 (×15): 4 mg via INTRAVENOUS
  Filled 2021-08-14 (×18): qty 2

## 2021-08-14 MED ORDER — ROPINIROLE HCL 1 MG PO TABS
0.5000 mg | ORAL_TABLET | Freq: Every day | ORAL | Status: DC
  Administered 2021-08-14 – 2021-08-19 (×6): 0.5 mg via ORAL
  Filled 2021-08-14 (×6): qty 1

## 2021-08-14 MED ORDER — CALCIUM GLUCONATE-NACL 1-0.675 GM/50ML-% IV SOLN
1.0000 g | Freq: Once | INTRAVENOUS | Status: AC
  Administered 2021-08-14: 1000 mg via INTRAVENOUS
  Filled 2021-08-14: qty 50

## 2021-08-14 MED ORDER — PANTOPRAZOLE SODIUM 40 MG IV SOLR
40.0000 mg | Freq: Two times a day (BID) | INTRAVENOUS | Status: DC
  Administered 2021-08-14 – 2021-08-19 (×11): 40 mg via INTRAVENOUS
  Filled 2021-08-14 (×12): qty 10

## 2021-08-14 MED ORDER — FERROUS SULFATE 325 (65 FE) MG PO TABS
325.0000 mg | ORAL_TABLET | Freq: Every day | ORAL | Status: DC
  Filled 2021-08-14: qty 1

## 2021-08-14 MED ORDER — ONDANSETRON HCL 4 MG PO TABS: 4.0000 mg | ORAL_TABLET | Freq: Four times a day (QID) | ORAL | Status: DC | PRN

## 2021-08-14 MED ORDER — ATORVASTATIN CALCIUM 10 MG PO TABS
20.0000 mg | ORAL_TABLET | Freq: Every day | ORAL | Status: DC
  Administered 2021-08-14 – 2021-08-19 (×6): 20 mg via ORAL
  Filled 2021-08-14 (×3): qty 2
  Filled 2021-08-14: qty 1
  Filled 2021-08-14 (×2): qty 2

## 2021-08-14 NOTE — Assessment & Plan Note (Signed)
BMI 41.47 Lifestyle modification

## 2021-08-14 NOTE — Assessment & Plan Note (Addendum)
PDMP reviewed -Patient receives [place] 10/325, #120, refilled monthly -Patient also receives alprazolam 0.5 mg, #60, refilled 120 Restart nortriptyline

## 2021-08-14 NOTE — Progress Notes (Signed)
Critical lab value: Calcium 4.2  Dr. Carles Collet notified 08/14/21 at 1445.

## 2021-08-14 NOTE — Assessment & Plan Note (Signed)
Hemoglobin A1c NovoLog sliding scale Holding Lantus temporarily and monitor CBC trends

## 2021-08-14 NOTE — Assessment & Plan Note (Signed)
Baseline creatinine 3.1-3.2 -However, the patient has not had any blood work since February 2021 -For renal failure has likely progressed since that time -She is clinically volume overloaded presently stable on room air with potassium 3.1 -Case discussed with nephrology, Dr. Lin>>> transfer to [facility] to initiate dialysis and placement of tunneled dialysis catheter -Nephrology will consult once the patient arrives

## 2021-08-14 NOTE — Progress Notes (Signed)
Sally Porter  Code Status: FULL Sally Porter is a 41 y.o. female patient admitted from [facility] awake, alert - oriented X4 - VSS -  no c/o shortness of breath, no c/o chest pain. Cardiac tele in place. Fall assessment complete, with patient able to verbalize understanding of risk associated with falls, and verbalized understanding to call nursing before up out of bed. Call light within reach, patient able to voice, and demonstrate understanding. Skin clean and dry. Scattered abrasions on bilateral arms and legs. No evidence of skin break down noted on exam.   Patient arrived from [facility] with foley in place. Per patient, foley was placed yesterday. Dr. Carles Collet made aware. ?  Will cont to eval and treat per MD orders.  Melonie Florida, RN  08/14/2021

## 2021-08-14 NOTE — Hospital Course (Signed)
41 year old female with history of hypertension, diabetes mellitus type 2, hyperlipidemia, chronic pain, and restless leg presenting with 4 to 5-day history of intractable nausea and vomiting.  She has not had any diarrhea, hematochezia, melena.  She has had some intermittent abdominal discomfort.  She denies any recent travels or eating any exotic foods.  There is been no sick contacts.  She denies any new prescription medications or over-the-counter medications.  She denies any alcohol or illicit drug use.  The patient denies any fevers, chills, chest pain, shortness of breath, coughing, hemoptysis.  There is no dysuria or hematuria. The patient states that she has not seen a physician for over 2 years.  She states that she has had leftover medications and refills from her previous prescriptions that she has been taking in the interim.  She states that she has been getting her insulin over-the-counter at [facility]. Because of her progressive nausea and vomiting she presented to [facility] ED. she was given antiemetics and 1 L of IV fluid. Labs that showed sodium of 137, potassium 3.1, serum creatinine 20.86.  LFTs were essentially unremarkable with lipase 137.  WBC 12.4, hemoglobin 8.5, platelets 292,000.  CT of the abdomen and pelvis was negative for any acute findings.  There is no hydronephrosis.  There is trace bilateral pleural effusions.  There is no pancreatic stranding.  UA showed 15 WBC, 15 RBC, protein 300 protein. Plans were made to transfer the patient to a tertiary care center secondary to acute on chronic renal failure and need to initiate dialysis. The patient arrived at [facility] in the late morning/early afternoon on 08/14/2021. I specifically spoke with nephrology, Dr. Augustin Coupe.  She recommended transfer to [facility] to begin the process of initiating long-term dialysis and placement of tunneled dialysis catheter. During her time at any time the patient was afebrile hemodynamically stable with oxygen  saturation 99% on room air.

## 2021-08-14 NOTE — Assessment & Plan Note (Signed)
Continue statin. 

## 2021-08-14 NOTE — Assessment & Plan Note (Signed)
Suspect uremic symptoms with a component of gastroparesis -Start Zofran around-the-clock -Avoiding IV fluids presently given renal failure and fluid overload

## 2021-08-14 NOTE — Assessment & Plan Note (Signed)
Continue atenolol Discontinue lisinopril

## 2021-08-14 NOTE — Progress Notes (Signed)
Report called to Darden Amber, RN on [id number]. Currently awaiting [facility].

## 2021-08-14 NOTE — H&P (Signed)
History and Physical    Patient: Sally Porter DOB: 12-06-1980 DOA: 08/14/2021 DOS: the patient was seen and examined on 08/14/2021 PCP: Bridget Hartshorn, NP  Patient coming from: Home  Chief Complaint: nausea and vomiting  HPI: Sally Porter is a 41 year old female with history of hypertension, diabetes mellitus type 2, hyperlipidemia, chronic pain, and restless leg presenting with 4 to 5-day history of intractable nausea and vomiting.  She has not had any diarrhea, hematochezia, melena.  She has had some intermittent abdominal discomfort.  She denies any recent travels or eating any exotic foods.  There is been no sick contacts.  She denies any new prescription medications or over-the-counter medications.  She denies any alcohol or illicit drug use.  The patient denies any fevers, chills, chest pain, shortness of breath, coughing, hemoptysis.  There is no dysuria or hematuria. The patient states that she has not seen a physician for over 2 years.  She states that she has had leftover medications and refills from her previous prescriptions that she has been taking in the interim.  She states that she has been getting her insulin over-the-counter at [facility]. Because of her progressive nausea and vomiting she presented to [facility] ED. she was given antiemetics and 1 L of IV fluid. Labs that showed sodium of 137, potassium 3.1, serum creatinine 20.86.  LFTs were essentially unremarkable with lipase 137.  WBC 12.4, hemoglobin 8.5, platelets 292,000.  CT of the abdomen and pelvis was negative for any acute findings.  There is no hydronephrosis.  There is trace bilateral pleural effusions.  There is no pancreatic stranding.  UA showed 15 WBC, 15 RBC, protein 300 protein. Plans were made to transfer the patient to a tertiary care center secondary to acute on chronic renal failure and need to initiate dialysis. The patient arrived at [facility] in the late morning/early afternoon on  08/14/2021. I specifically spoke with nephrology, Dr. Augustin Coupe.  She recommended transfer to [facility] to begin the process of initiating long-term dialysis and placement of tunneled dialysis catheter. During her time at any time the patient was afebrile hemodynamically stable with oxygen saturation 99% on room air.   Review of Systems: As mentioned in the history of present illness. All other systems reviewed and are negative. Past Medical History:  Diagnosis Date   Diabetes mellitus without complication ([place])    Diabetes mellitus, type II ([place])    Glaucoma    Hyperlipidemia    Hypertension    No past surgical history on file. Social History:  reports that she has never smoked. She has never used smokeless tobacco. She reports that she does not currently use alcohol. She reports that she does not use drugs.  Allergies  Allergen Reactions   Morphine And Related     No family history on file.  Prior to Admission medications   Medication Sig Start Date End Date Taking? Authorizing Provider  ALPRAZolam Duanne Moron) 0.5 MG tablet 1-2 tablets at bedtime. 08/03/18   [provider]  atenolol (TENORMIN) 50 MG tablet TAKE 1 TABLET BY MOUTH DAILY 07/02/19   Cassandria Anger, MD  atorvastatin (LIPITOR) 20 MG tablet daily. 07/19/18   [provider]  calcitonin, salmon, (MIACALCIN/FORTICAL) 200 UNIT/ACT nasal spray USE ONE SPRAY in one nostril EVERY DAY (alternating nostrils) 10/26/18   Nida, Marella Chimes, MD  Continuous Blood Gluc Sensor (FREESTYLE LIBRE 14 DAY SENSOR) MISC Inject 1 each into the skin every 14 (fourteen) days. Use as directed. 05/01/19  Cassandria Anger, MD  ferrous sulfate 325 (65 FE) MG tablet Take 325 mg by mouth daily with breakfast.    [provider]  insulin glargine (LANTUS SOLOSTAR) 100 UNIT/ML Solostar Pen Inject 30 Units into the skin at bedtime. 09/27/19   Cassandria Anger, MD  insulin lispro (HUMALOG KWIKPEN) 100 UNIT/ML KwikPen INJECT  5-11 UNITS INTO THE SKIN THREE TIMES DAILY BEFORE MEALS 08/08/19   Nida, Marella Chimes, MD  lidocaine (LIDODERM) 5 % APPLY ONE PATCH TO LOWER BACK FOR 12 HOURS AND THEN OFF FOR 12 HOURS 04/06/19   [provider]  lisinopril (ZESTRIL) 30 MG tablet Take 1 tablet (30 mg total) by mouth daily. 05/11/19   Cassandria Anger, MD  Multiple Vitamins-Minerals (ZINC PO) Take by mouth daily.    [provider]  nortriptyline (PAMELOR) 10 MG capsule 2 (two) times a day. 07/28/18   [provider]  oxyCODONE-acetaminophen (PERCOCET) 10-325 MG tablet every 6 (six) hours as needed. 08/17/17   [provider]  rOPINIRole (REQUIP) 0.5 MG tablet at bedtime. 07/28/18   [provider]  sodium bicarbonate 650 MG tablet Take 1,300 mg by mouth 2 (two) times daily. 07/28/18   [provider]  Vitamin D, Ergocalciferol, (DRISDOL) 1.25 MG (50000 UNIT) CAPS capsule Take 1 capsule (50,000 Units total) by mouth every 7 (seven) days. 05/01/19   Cassandria Anger, MD    Physical Exam: Vitals:   08/14/21 1132  BP: (!) 173/85  Pulse: 91  Resp: 18  Temp: 98.4 F (36.9 C)  TempSrc: Oral  SpO2: 100%  Weight: 123.7 kg  Height: 5\' 8"  (1.727 m)   GENERAL:  A&O x 3, NAD, well developed, cooperative, follows commands HEENT: [place]/AT, No thrush, No icterus, No oral ulcers Neck:  No neck mass, No meningismus, soft, supple CV: RRR, no S3, no S4, no rub, no JVD Lungs:  fine bibasilar crackles. No wheeze Abd: soft/NT +BS, nondistended Ext: 3 + LE edema, no lymphangitis, no cyanosis, no rashes Neuro:  CN II-XII intact, strength 4/5 in RUE, RLE, strength 4/5 LUE, LLE; sensation intact bilateral; no dysmetria; babinski equivocal  Data Reviewed: Data reviewed in history  Assessment and Plan: * Acute renal failure superimposed on stage 4 chronic kidney disease (HCC) Baseline creatinine 3.1-3.2 -However, the patient has not had any blood work since February 2021 -For renal  failure has likely progressed since that time -She is clinically volume overloaded presently stable on room air with potassium 3.1 -Case discussed with nephrology, Dr. Lin>>> transfer to [facility] to initiate dialysis and placement of tunneled dialysis catheter -Nephrology will consult once the patient arrives  Uncontrolled type 2 diabetes mellitus with hyperglycemia, with long-term current use of insulin (HCC) Hemoglobin A1c NovoLog sliding scale Holding Lantus temporarily and monitor CBC trends  Opioid dependence ([place]) PDMP reviewed -Patient receives Percocet 10/325, #120, refilled monthly -Patient also receives alprazolam 0.5 mg, #60, refilled 120 Restart nortriptyline  Obesity, Class III, BMI 40-49.9 (morbid obesity) (HCC) BMI 41.47 Lifestyle modification  Intractable nausea and vomiting Suspect uremic symptoms with a component of gastroparesis -Start Zofran around-the-clock -Avoiding IV fluids presently given renal failure and fluid overload  Essential hypertension, benign Continue atenolol Discontinue lisinopril  Mixed hyperlipidemia Continue statin      Advance Care Planning: FULL CODE  Consults: nephrology  Family Communication: sister updated at bedside  Severity of Illness: The appropriate patient status for this patient is INPATIENT. Inpatient status is judged to be reasonable and necessary in order  to provide the required intensity of service to ensure the patient's safety. The patient's presenting symptoms, physical exam findings, and initial radiographic and laboratory data in the context of their chronic comorbidities is felt to place them at high risk for further clinical deterioration. Furthermore, it is not anticipated that the patient will be medically stable for discharge from the hospital within 2 midnights of admission.   * I certify that at the point of admission it is my clinical judgment that the patient will require inpatient hospital care  spanning beyond 2 midnights from the point of admission due to high intensity of service, high risk for further deterioration and high frequency of surveillance required.*  Author: Orson Eva, MD 08/14/2021 1:24 PM  For on call review [email or web address].

## 2021-08-15 ENCOUNTER — Other Ambulatory Visit: Payer: Self-pay

## 2021-08-15 ENCOUNTER — Inpatient Hospital Stay (HOSPITAL_COMMUNITY): Payer: Medicare Other

## 2021-08-15 DIAGNOSIS — N184 Chronic kidney disease, stage 4 (severe): Secondary | ICD-10-CM

## 2021-08-15 DIAGNOSIS — N179 Acute kidney failure, unspecified: Secondary | ICD-10-CM

## 2021-08-15 HISTORY — PX: IR FLUORO GUIDE CV LINE RIGHT: IMG2283

## 2021-08-15 HISTORY — PX: IR US GUIDE VASC ACCESS RIGHT: IMG2390

## 2021-08-15 LAB — RENAL FUNCTION PANEL
Albumin: 2.5 g/dL — ABNORMAL LOW (ref 3.5–5.0)
Anion gap: 19 — ABNORMAL HIGH (ref 5–15)
BUN: 98 mg/dL — ABNORMAL HIGH (ref 6–20)
CO2: 19 mmol/L — ABNORMAL LOW (ref 22–32)
Calcium: 4.3 mg/dL — CL (ref 8.9–10.3)
Chloride: 96 mmol/L — ABNORMAL LOW (ref 98–111)
Creatinine, Ser: 19.7 mg/dL — ABNORMAL HIGH (ref 0.44–1.00)
GFR, Estimated: 2 mL/min — ABNORMAL LOW (ref >60)
Glucose, Bld: 315 mg/dL — ABNORMAL HIGH (ref 70–99)
Phosphorus: 9.2 mg/dL — ABNORMAL HIGH (ref 2.5–4.6)
Potassium: 4.1 mmol/L (ref 3.5–5.1)
Sodium: 134 mmol/L — ABNORMAL LOW (ref 135–145)

## 2021-08-15 LAB — GLUCOSE, CAPILLARY
Glucose-Capillary: 347 mg/dL — ABNORMAL HIGH (ref 70–99)
Glucose-Capillary: 403 mg/dL — ABNORMAL HIGH (ref 70–99)
Glucose-Capillary: 375 mg/dL — ABNORMAL HIGH (ref 70–99)
Glucose-Capillary: 338 mg/dL — ABNORMAL HIGH (ref 70–99)
Glucose-Capillary: 91 mg/dL (ref 70–99)

## 2021-08-15 LAB — IRON AND TIBC
Iron: 20 ug/dL — ABNORMAL LOW (ref 28–170)
Saturation Ratios: 9 % — ABNORMAL LOW (ref 10.4–31.8)
TIBC: 223 ug/dL — ABNORMAL LOW (ref 250–450)
UIBC: 203 ug/dL

## 2021-08-15 LAB — PHOSPHORUS: Phosphorus: 6.6 mg/dL — ABNORMAL HIGH (ref 2.5–4.6)

## 2021-08-15 LAB — PROTEIN / CREATININE RATIO, URINE
Creatinine, Urine: 76.61 mg/dL
Protein Creatinine Ratio: 10.14 mg/mg{Cre} — ABNORMAL HIGH (ref 0.00–0.15)
Total Protein, Urine: 777 mg/dL

## 2021-08-15 LAB — CREATININE, URINE, RANDOM: Creatinine, Urine: 76.37 mg/dL

## 2021-08-15 LAB — FERRITIN: Ferritin: 36 ng/mL (ref 11–307)

## 2021-08-15 LAB — HEPATITIS B SURFACE ANTIBODY,QUALITATIVE: Hep B S Ab: NONREACTIVE

## 2021-08-15 LAB — CBC
HCT: 23 % — ABNORMAL LOW (ref 36.0–46.0)
Hemoglobin: 6.9 g/dL — CL (ref 12.0–15.0)
MCH: 23.2 pg — ABNORMAL LOW (ref 26.0–34.0)
MCHC: 30 g/dL (ref 30.0–36.0)
MCV: 77.4 fL — ABNORMAL LOW (ref 80.0–100.0)
Platelets: 232 10*3/uL (ref 150–400)
RBC: 2.97 MIL/uL — ABNORMAL LOW (ref 3.87–5.11)
RDW: 17.1 % — ABNORMAL HIGH (ref 11.5–15.5)
WBC: 9.4 10*3/uL (ref 4.0–10.5)
nRBC: 0 % (ref 0.0–0.2)

## 2021-08-15 LAB — HEPATITIS B SURFACE ANTIGEN: Hepatitis B Surface Ag: NONREACTIVE

## 2021-08-15 LAB — HEPATITIS C ANTIBODY: HCV Ab: NONREACTIVE

## 2021-08-15 LAB — HEPATITIS B CORE ANTIBODY, TOTAL: Hep B Core Total Ab: NONREACTIVE

## 2021-08-15 LAB — SODIUM, URINE, RANDOM: Sodium, Ur: 65 mmol/L

## 2021-08-15 LAB — ABO/RH: ABO/RH(D): A POS

## 2021-08-15 LAB — PREPARE RBC (CROSSMATCH)

## 2021-08-15 MED ORDER — FENTANYL CITRATE (PF) 100 MCG/2ML IJ SOLN
INTRAMUSCULAR | Status: AC | PRN
  Administered 2021-08-15 (×2): 50 ug via INTRAVENOUS

## 2021-08-15 MED ORDER — INSULIN GLARGINE-YFGN 100 UNIT/ML ~~LOC~~ SOLN
25.0000 [IU] | Freq: Every day | SUBCUTANEOUS | Status: DC
  Filled 2021-08-15: qty 0.25

## 2021-08-15 MED ORDER — SODIUM CHLORIDE 0.9% IV SOLUTION: Freq: Once | INTRAVENOUS | Status: DC

## 2021-08-15 MED ORDER — MIDAZOLAM HCL 2 MG/2ML IJ SOLN
INTRAMUSCULAR | Status: AC
  Filled 2021-08-15: qty 2

## 2021-08-15 MED ORDER — INSULIN ASPART 100 UNIT/ML IJ SOLN
10.0000 [IU] | Freq: Once | INTRAMUSCULAR | Status: DC
Start: 2021-08-15 — End: 2021-08-15

## 2021-08-15 MED ORDER — INSULIN ASPART 100 UNIT/ML IJ SOLN
7.0000 [IU] | Freq: Three times a day (TID) | INTRAMUSCULAR | Status: DC
  Administered 2021-08-15 – 2021-08-16 (×4): 7 [IU] via SUBCUTANEOUS

## 2021-08-15 MED ORDER — HEPARIN SODIUM (PORCINE) 1000 UNIT/ML IJ SOLN
INTRAMUSCULAR | Status: AC
  Administered 2021-08-15: 2800 [IU]
  Filled 2021-08-15: qty 10

## 2021-08-15 MED ORDER — HYDROMORPHONE HCL 1 MG/ML IJ SOLN
0.5000 mg | INTRAMUSCULAR | Status: DC | PRN
  Administered 2021-08-15 – 2021-08-19 (×17): 0.5 mg via INTRAVENOUS
  Filled 2021-08-15 (×19): qty 1

## 2021-08-15 MED ORDER — INSULIN ASPART 100 UNIT/ML IJ SOLN
5.0000 [IU] | Freq: Once | INTRAMUSCULAR | Status: AC
  Administered 2021-08-15: 5 [IU] via INTRAVENOUS

## 2021-08-15 MED ORDER — ANTICOAGULANT SODIUM CITRATE 4% (200MG/5ML) IV SOLN
5.0000 mL | Status: DC | PRN
  Filled 2021-08-15 (×2): qty 5

## 2021-08-15 MED ORDER — ALTEPLASE 2 MG IJ SOLR: 2.0000 mg | Freq: Once | INTRAMUSCULAR | Status: DC | PRN

## 2021-08-15 MED ORDER — NALOXONE HCL 0.4 MG/ML IJ SOLN: 0.4000 mg | INTRAMUSCULAR | Status: DC | PRN

## 2021-08-15 MED ORDER — FENTANYL CITRATE (PF) 100 MCG/2ML IJ SOLN
INTRAMUSCULAR | Status: AC
  Filled 2021-08-15: qty 2

## 2021-08-15 MED ORDER — CEFAZOLIN SODIUM-DEXTROSE 2-4 GM/100ML-% IV SOLN
INTRAVENOUS | Status: AC | PRN
  Administered 2021-08-15: 2 g via INTRAVENOUS

## 2021-08-15 MED ORDER — LIDOCAINE-EPINEPHRINE (PF) 1 %-1:200000 IJ SOLN
INTRAMUSCULAR | Status: AC | PRN
  Administered 2021-08-15: 20 mL

## 2021-08-15 MED ORDER — CEFAZOLIN SODIUM-DEXTROSE 2-4 GM/100ML-% IV SOLN
INTRAVENOUS | Status: AC
  Filled 2021-08-15: qty 100

## 2021-08-15 MED ORDER — HEPARIN SODIUM (PORCINE) 1000 UNIT/ML DIALYSIS
1000.0000 [IU] | INTRAMUSCULAR | Status: DC | PRN
  Filled 2021-08-15: qty 1

## 2021-08-15 MED ORDER — INSULIN GLARGINE-YFGN 100 UNIT/ML ~~LOC~~ SOLN
35.0000 [IU] | Freq: Every day | SUBCUTANEOUS | Status: DC
  Administered 2021-08-15 – 2021-08-18 (×3): 35 [IU] via SUBCUTANEOUS
  Filled 2021-08-15 (×5): qty 0.35

## 2021-08-15 MED ORDER — LIDOCAINE-EPINEPHRINE 1 %-1:100000 IJ SOLN
INTRAMUSCULAR | Status: AC
  Filled 2021-08-15: qty 1

## 2021-08-15 MED ORDER — INSULIN ASPART 100 UNIT/ML IJ SOLN
0.0000 [IU] | Freq: Three times a day (TID) | INTRAMUSCULAR | Status: DC
  Administered 2021-08-15: 15 [IU] via SUBCUTANEOUS
  Administered 2021-08-16: 4 [IU] via SUBCUTANEOUS
  Administered 2021-08-16: 20 [IU] via SUBCUTANEOUS
  Administered 2021-08-16: 7 [IU] via SUBCUTANEOUS
  Administered 2021-08-17: 3 [IU] via SUBCUTANEOUS
  Administered 2021-08-17: 4 [IU] via SUBCUTANEOUS
  Administered 2021-08-18: 15 [IU] via SUBCUTANEOUS
  Administered 2021-08-18: 11 [IU] via SUBCUTANEOUS
  Administered 2021-08-19: 15 [IU] via SUBCUTANEOUS
  Administered 2021-08-19: 11 [IU] via SUBCUTANEOUS
  Administered 2021-08-19: 3 [IU] via SUBCUTANEOUS
  Administered 2021-08-20: 11 [IU] via SUBCUTANEOUS
  Administered 2021-08-20: 20 [IU] via SUBCUTANEOUS

## 2021-08-15 MED ORDER — CHLORHEXIDINE GLUCONATE CLOTH 2 % EX PADS
6.0000 | MEDICATED_PAD | Freq: Every day | CUTANEOUS | Status: DC
  Administered 2021-08-15 – 2021-08-20 (×6): 6 via TOPICAL

## 2021-08-15 MED ORDER — INSULIN ASPART 100 UNIT/ML IJ SOLN: 0.0000 [IU] | Freq: Every day | INTRAMUSCULAR | Status: DC

## 2021-08-15 MED ORDER — DIPHENHYDRAMINE HCL 25 MG PO CAPS: 25.0000 mg | ORAL_CAPSULE | Freq: Four times a day (QID) | ORAL | Status: DC | PRN

## 2021-08-15 MED ORDER — MIDAZOLAM HCL 2 MG/2ML IJ SOLN
INTRAMUSCULAR | Status: AC | PRN
  Administered 2021-08-15 (×2): 1 mg via INTRAVENOUS

## 2021-08-15 MED ORDER — HYDRALAZINE HCL 50 MG PO TABS
50.0000 mg | ORAL_TABLET | Freq: Three times a day (TID) | ORAL | Status: DC
  Administered 2021-08-15 – 2021-08-20 (×12): 50 mg via ORAL
  Filled 2021-08-15 (×13): qty 1

## 2021-08-15 MED ORDER — CALCITRIOL 0.25 MCG PO CAPS
0.2500 ug | ORAL_CAPSULE | Freq: Every day | ORAL | Status: DC
Start: 2021-08-15 — End: 2021-08-17
  Administered 2021-08-15 – 2021-08-17 (×3): 0.25 ug via ORAL
  Filled 2021-08-15 (×3): qty 1

## 2021-08-15 NOTE — Progress Notes (Signed)
Patient transported to [facility] via carelink in stable condition. Report given to carelink and accepting RN, Kathlee Nations, at [facility].

## 2021-08-15 NOTE — Consult Note (Signed)
Reason for Consult: Renal failure Referring Physician:  Dr. Carles Collet  Chief Complaint: Nausea and vomiting  Assessment/Plan: ESRD - newly diagnosed with creatinine already in the 4's when she was taken off dialysis in 2019, then lost to follow up because of admitted pt noncompliance. - Will check urine studies; no need for an u/s with CT already ruling out hydronephrosis. - Planning on initiating dialysis after [facility] places TC today; appreciate [facility] agreeing to place a TC especially over a weekend. Patient is displaying signs of uremia.  - Avoid nephrotoxic medications including NSAIDs and iodinated intravenous contrast exposure unless the latter is absolutely indicated.   - Preferred narcotic agents for pain control are hydromorphone, fentanyl, and methadone. Morphine should not be used.  - Avoid Baclofen and avoid oral sodium phosphate and magnesium citrate based laxatives / bowel preps.  - Continue strict Input and Output monitoring. Will monitor the patient closely with you and intervene or adjust therapy as indicated by changes in clinical status/labs  Renal osteodystrophy - will check a phos and also iPTH, 25vitD; I suspect the phos will be elevated given the hypocalcemia.  Anemia - will check iron panel and start ESA Uncontrolled type 2 diabetes - on ISS per primary. Obesity HTN - once she starts dialysis lisinopril will be fine.   HPI: Sally Porter is an 41 y.o. female HTN, DM, HLD, chronic pain, restless leg syndrome, presenting with nausea and vomiting for 4-5 days. She denies anorexia before that; also denies shortness of breath, obstructive symptoms, diarrhea. She was on dialysis in 2019 while in the hospital and then for only a few weeks as an outpatient unit. She has not followed up with any nephrologist since then because of admitted noncompliance. She denies fever, chills, chest pain, cough, dysuria, hematuria. She was given fluids at [facility] and antiemetics with CT showing no  hydronephrosis but a foley catheter was placed.   BUN/Cr 94/19.37 Hb 7.8 , A1c 10.   ROS Pertinent items are noted in HPI.  Chemistry and CBC: Creatinine  Date/Time Value Ref Range Status  04/27/2019 12:00 AM 3.8 (A) 0.5 - 1.1 Final   Creat  Date/Time Value Ref Range Status  08/10/2018 10:13 AM 2.83 (H) 0.50 - 1.10 mg/dL Final   Creatinine, Ser  Date/Time Value Ref Range Status  08/15/2021 03:16 AM 19.70 (H) 0.44 - 1.00 mg/dL Final  08/14/2021 01:31 PM 19.37 (H) 0.44 - 1.00 mg/dL Final  11/02/2015 08:15 AM 2.20 (H) 0.44 - 1.00 mg/dL Final   Recent Labs  Lab 08/14/21 1331 08/15/21 0316  NA 134* 134*  K 3.7 4.1  CL 94* 96*  CO2 18* 19*  GLUCOSE 333* 315*  BUN 94* 98*  CREATININE 19.37* 19.70*  CALCIUM 4.2* 4.3*  PHOS 8.4* 9.2*   Recent Labs  Lab 08/14/21 1331 08/15/21 0316  WBC 11.6* 9.4  HGB 7.8* 6.9*  HCT 25.5* 23.0*  MCV 76.8* 77.4*  PLT 250 232   Liver Function Tests: Recent Labs  Lab 08/14/21 1331 08/15/21 0316  ALBUMIN 2.7* 2.5*   No results for input(s): "LIPASE", "AMYLASE" in the last 168 hours. No results for input(s): "AMMONIA" in the last 168 hours. Cardiac Enzymes: No results for input(s): "CKTOTAL", "CKMB", "CKMBINDEX", "TROPONINI" in the last 168 hours. Iron Studies: No results for input(s): "IRON", "TIBC", "TRANSFERRIN", "FERRITIN" in the last 72 hours. PT/INR: @LABRCNTIP (inr:5)  Xrays/Other Studies: ) Results for orders placed or performed during the hospital encounter of 08/14/21 (from the past 48 hour(s))  Glucose, capillary  Status: Abnormal   Collection Time: 08/14/21 11:42 AM  Result Value Ref Range   Glucose-Capillary 325 (H) 70 - 99 mg/dL    Comment: Glucose reference range applies only to samples taken after fasting for at least 8 hours.  MRSA Next Gen by PCR, Nasal     Status: None   Collection Time: 08/14/21 11:47 AM   Specimen: Nasal Mucosa; Nasal Swab  Result Value Ref Range   MRSA by PCR Next Gen NOT DETECTED NOT  DETECTED    Comment: (NOTE) The GeneXpert MRSA Assay (FDA approved for NASAL specimens only), is one component of a comprehensive MRSA colonization surveillance program. It is not intended to diagnose MRSA infection nor to guide or monitor treatment for MRSA infections. Test performance is not FDA approved in patients less than 35 years old. Performed at [facility], [address] [place], [place] [address]   Hemoglobin A1c     Status: Abnormal   Collection Time: 08/14/21  1:31 PM  Result Value Ref Range   Hgb A1c MFr Bld 10.0 (H) 4.8 - 5.6 %    Comment: (NOTE) Pre diabetes:          5.7%-6.4%  Diabetes:              >6.4%  Glycemic control for   <7.0% adults with diabetes    Mean Plasma Glucose 240.3 mg/dL    Comment: Performed at [facility] [address], [place], [place] [address]  Renal function panel     Status: Abnormal   Collection Time: 08/14/21  1:31 PM  Result Value Ref Range   Sodium 134 (L) 135 - 145 mmol/L   Potassium 3.7 3.5 - 5.1 mmol/L   Chloride 94 (L) 98 - 111 mmol/L   CO2 18 (L) 22 - 32 mmol/L   Glucose, Bld 333 (H) 70 - 99 mg/dL    Comment: Glucose reference range applies only to samples taken after fasting for at least 8 hours.   BUN 94 (H) 6 - 20 mg/dL   Creatinine, Ser 19.37 (H) 0.44 - 1.00 mg/dL   Calcium 4.2 (LL) 8.9 - 10.3 mg/dL    Comment: CRITICAL RESULT CALLED TO, READ BACK BY AND VERIFIED WITH: TO ALISA FOWLER,RN @1446  ON 08/14/21 BY GMCGEEHON.    Phosphorus 8.4 (H) 2.5 - 4.6 mg/dL   Albumin 2.7 (L) 3.5 - 5.0 g/dL   GFR, Estimated 2 (L) >60 mL/min    Comment: (NOTE) Calculated using the CKD-EPI Creatinine Equation (2021)    Anion gap 22 (H) 5 - 15    Comment: Performed at [facility], [address], [place], [place] [address]  CBC     Status: Abnormal   Collection Time: 08/14/21  1:31 PM  Result Value Ref Range   WBC 11.6 (H) 4.0 - 10.5 K/uL   RBC 3.32 (L) 3.87 - 5.11 MIL/uL   Hemoglobin 7.8 (L) 12.0 - 15.0 g/dL    Comment:  Reticulocyte Hemoglobin testing may be clinically indicated, consider ordering this additional test [id number]    HCT 25.5 (L) 36.0 - 46.0 %   MCV 76.8 (L) 80.0 - 100.0 fL   MCH 23.5 (L) 26.0 - 34.0 pg   MCHC 30.6 30.0 - 36.0 g/dL   RDW 17.5 (H) 11.5 - 15.5 %   Platelets 250 150 - 400 K/uL   nRBC 0.0 0.0 - 0.2 %    Comment: Performed at [facility], [address] [place], [place] [address]  HIV  Antibody (routine testing w rflx)     Status: None   Collection Time: 08/14/21  1:31 PM  Result Value Ref Range   HIV Screen 4th Generation wRfx Non Reactive Non Reactive    Comment: Performed at [facility], [place] [address], [place], [place] [address]  Glucose, capillary     Status: Abnormal   Collection Time: 08/14/21  1:48 PM  Result Value Ref Range   Glucose-Capillary 323 (H) 70 - 99 mg/dL    Comment: Glucose reference range applies only to samples taken after fasting for at least 8 hours.  Glucose, capillary     Status: Abnormal   Collection Time: 08/14/21  3:56 PM  Result Value Ref Range   Glucose-Capillary 251 (H) 70 - 99 mg/dL    Comment: Glucose reference range applies only to samples taken after fasting for at least 8 hours.  Glucose, capillary     Status: Abnormal   Collection Time: 08/14/21  8:57 PM  Result Value Ref Range   Glucose-Capillary 240 (H) 70 - 99 mg/dL    Comment: Glucose reference range applies only to samples taken after fasting for at least 8 hours.  CBC     Status: Abnormal   Collection Time: 08/15/21  3:16 AM  Result Value Ref Range   WBC 9.4 4.0 - 10.5 K/uL   RBC 2.97 (L) 3.87 - 5.11 MIL/uL   Hemoglobin 6.9 (LL) 12.0 - 15.0 g/dL    Comment: REPEATED TO VERIFY THIS CRITICAL RESULT HAS VERIFIED AND BEEN CALLED TO POINDEXTER,M BY JAMIE WOODIE ON 06 17 2023 AT 0449, AND HAS BEEN READ BACK.     HCT 23.0 (L) 36.0 - 46.0 %   MCV 77.4 (L) 80.0 - 100.0 fL   MCH 23.2 (L) 26.0 - 34.0 pg   MCHC 30.0 30.0 - 36.0 g/dL   RDW 17.1 (H) 11.5 - 15.5 %   Platelets  232 150 - 400 K/uL   nRBC 0.0 0.0 - 0.2 %    Comment: Performed at [facility], [address], [place], [place] [address]  Renal function panel     Status: Abnormal   Collection Time: 08/15/21  3:16 AM  Result Value Ref Range   Sodium 134 (L) 135 - 145 mmol/L   Potassium 4.1 3.5 - 5.1 mmol/L   Chloride 96 (L) 98 - 111 mmol/L   CO2 19 (L) 22 - 32 mmol/L   Glucose, Bld 315 (H) 70 - 99 mg/dL    Comment: Glucose reference range applies only to samples taken after fasting for at least 8 hours.   BUN 98 (H) 6 - 20 mg/dL   Creatinine, Ser 19.70 (H) 0.44 - 1.00 mg/dL   Calcium 4.3 (LL) 8.9 - 10.3 mg/dL    Comment: DELTA CHECK NOTED PT GONE TO [place]    Phosphorus 9.2 (H) 2.5 - 4.6 mg/dL   Albumin 2.5 (L) 3.5 - 5.0 g/dL   GFR, Estimated 2 (L) >60 mL/min    Comment: (NOTE) Calculated using the CKD-EPI Creatinine Equation (2021)    Anion gap 19 (H) 5 - 15    Comment: Performed at [facility], [address] [place], [place] [address]  Glucose, capillary     Status: Abnormal   Collection Time: 08/15/21  5:17 AM  Result Value Ref Range   Glucose-Capillary 347 (H) 70 - 99 mg/dL    Comment: Glucose reference range applies only to samples taken after fasting for at least 8 hours.  No results found.  PMH:   Past Medical History:  Diagnosis Date   Diabetes mellitus without complication ([place])    Diabetes mellitus, type II ([place])    Glaucoma    Hyperlipidemia    Hypertension     PSH:  No past surgical history on file.  Allergies:  Allergies  Allergen Reactions   Hydromorphone Itching   Morphine And Related Itching   Pregabalin Itching    Medications:   Prior to Admission medications   Medication Sig Start Date End Date Taking? Authorizing Provider  ALPRAZolam Duanne Moron) 0.5 MG tablet 1-2 tablets at bedtime. 08/03/18   [provider]  atenolol (TENORMIN) 50 MG tablet TAKE 1 TABLET BY MOUTH DAILY 07/02/19   Cassandria Anger, MD  atorvastatin (LIPITOR) 20 MG tablet daily.  07/19/18   [provider]  calcitonin, salmon, (MIACALCIN/FORTICAL) 200 UNIT/ACT nasal spray USE ONE SPRAY in one nostril EVERY DAY (alternating nostrils) 10/26/18   Nida, Marella Chimes, MD  Continuous Blood Gluc Sensor (FREESTYLE LIBRE 14 DAY SENSOR) MISC Inject 1 each into the skin every 14 (fourteen) days. Use as directed. 05/01/19   Cassandria Anger, MD  ferrous sulfate 325 (65 FE) MG tablet Take 325 mg by mouth daily with breakfast.    [provider]  insulin glargine (LANTUS SOLOSTAR) 100 UNIT/ML Solostar Pen Inject 30 Units into the skin at bedtime. 09/27/19   Cassandria Anger, MD  insulin lispro (HUMALOG KWIKPEN) 100 UNIT/ML KwikPen INJECT 5-11 UNITS INTO THE SKIN THREE TIMES DAILY BEFORE MEALS 08/08/19   Nida, Marella Chimes, MD  lidocaine (LIDODERM) 5 % APPLY ONE PATCH TO LOWER BACK FOR 12 HOURS AND THEN OFF FOR 12 HOURS 04/06/19   [provider]  lisinopril (ZESTRIL) 30 MG tablet Take 1 tablet (30 mg total) by mouth daily. 05/11/19   Cassandria Anger, MD  Multiple Vitamins-Minerals (ZINC PO) Take by mouth daily.    [provider]  nortriptyline (PAMELOR) 10 MG capsule 2 (two) times a day. 07/28/18   [provider]  oxyCODONE-acetaminophen (PERCOCET) 10-325 MG tablet every 6 (six) hours as needed. 08/17/17   [provider]  rOPINIRole (REQUIP) 0.5 MG tablet at bedtime. 07/28/18   [provider]  sodium bicarbonate 650 MG tablet Take 1,300 mg by mouth 2 (two) times daily. 07/28/18   [provider]  Vitamin D, Ergocalciferol, (DRISDOL) 1.25 MG (50000 UNIT) CAPS capsule Take 1 capsule (50,000 Units total) by mouth every 7 (seven) days. 05/01/19   Cassandria Anger, MD    Discontinued Meds:   Medications Discontinued During This Encounter  Medication Reason   oxyCODONE-acetaminophen (PERCOCET) 10-325 MG per tablet 1 tablet Entry Error    Social History:  reports that she has never smoked. She has never  used smokeless tobacco. She reports that she does not currently use alcohol. She reports that she does not use drugs.  Family History:  No family history on file.  Blood pressure (!) 161/85, pulse 80, temperature 98.2 F (36.8 C), temperature source Oral, resp. rate 16, height 5\' 8"  (1.727 m), weight 124.9 kg, SpO2 100 %. General appearance: alert, cooperative, and appears stated age Head: Normocephalic, without obvious abnormality, atraumatic Eyes: negative Neck: no adenopathy, no carotid bruit, supple, symmetrical, trachea midline, and thyroid not enlarged, symmetric, no tenderness/mass/nodules Back: symmetric, no curvature. ROM normal. No CVA tenderness. Resp: rales bilaterally Cardio: regular rate and rhythm GI: soft, non-tender; bowel sounds normal; no masses,  no organomegaly and obese Extremities: edema  trace Pulses: 2+ and symmetric Skin: Skin color, texture, turgor normal. No rashes or lesions       Avarey Yaeger, Hunt Oris, MD 08/15/2021, 6:52 AM

## 2021-08-15 NOTE — Consult Note (Signed)
Chief Complaint: Patient was seen in consultation today for tunneled HD catheter placement.  Referring Physician(s): Lin,James W  Supervising Physician: Ruthann Cancer  Patient Status: [facility] - In-pt  History of Present Illness: Sally Porter is a 41 y.o. female with a past medical history significant for chronic pain syndrome, RLS, glaucoma, DM, HTN, HLD and ESRD who presents today for tunneled HD catheter placement. Sally Porter presented to [facility] ED 08/13/21 and was found to have serum creatinine 20.86, WBC 12.4, hgb 8.5, plt 292. She was then transferred to [facility] ED on 08/14/21 for further evaluation of acute on chronic renal failure and initiation of HD. She arrived to [facility] overnight to begin HD and IR has been consulted for tunneled HD catheter placement.  Patient seen in her room, she is sleepy but otherwise feels ok. She states she was on dialysis years ago and she had a dialysis catheter in her right chest. She was taken off dialysis in 2019 and was lost to follow up, she has not seen a physician in over 2 years. She has been taking leftover medications and insulin over the counter from [place]. She understands the need for tunneled HD catheter placement to begin HD today and is agreeable to proceed.  Past Medical History:  Diagnosis Date   Diabetes mellitus without complication ([place])    Diabetes mellitus, type II ([place])    Glaucoma    Hyperlipidemia    Hypertension     No past surgical history on file.  Allergies: Hydromorphone, Morphine and related, and Pregabalin  Medications: Prior to Admission medications   Medication Sig Start Date End Date Taking? Authorizing Provider  ALPRAZolam Duanne Moron) 0.5 MG tablet 1-2 tablets at bedtime. 08/03/18   [provider]  atenolol (TENORMIN) 50 MG tablet TAKE 1 TABLET BY MOUTH DAILY 07/02/19   Cassandria Anger, MD  atorvastatin (LIPITOR) 20 MG tablet daily. 07/19/18   [provider]  calcitonin, salmon,  (MIACALCIN/FORTICAL) 200 UNIT/ACT nasal spray USE ONE SPRAY in one nostril EVERY DAY (alternating nostrils) 10/26/18   Nida, Marella Chimes, MD  Continuous Blood Gluc Sensor (FREESTYLE LIBRE 14 DAY SENSOR) MISC Inject 1 each into the skin every 14 (fourteen) days. Use as directed. 05/01/19   Cassandria Anger, MD  ferrous sulfate 325 (65 FE) MG tablet Take 325 mg by mouth daily with breakfast.    [provider]  insulin glargine (LANTUS SOLOSTAR) 100 UNIT/ML Solostar Pen Inject 30 Units into the skin at bedtime. 09/27/19   Cassandria Anger, MD  insulin lispro (HUMALOG KWIKPEN) 100 UNIT/ML KwikPen INJECT 5-11 UNITS INTO THE SKIN THREE TIMES DAILY BEFORE MEALS 08/08/19   Nida, Marella Chimes, MD  lidocaine (LIDODERM) 5 % APPLY ONE PATCH TO LOWER BACK FOR 12 HOURS AND THEN OFF FOR 12 HOURS 04/06/19   [provider]  lisinopril (ZESTRIL) 30 MG tablet Take 1 tablet (30 mg total) by mouth daily. 05/11/19   Cassandria Anger, MD  Multiple Vitamins-Minerals (ZINC PO) Take by mouth daily.    [provider]  nortriptyline (PAMELOR) 10 MG capsule 2 (two) times a day. 07/28/18   [provider]  oxyCODONE-acetaminophen (PERCOCET) 10-325 MG tablet every 6 (six) hours as needed. 08/17/17   [provider]  rOPINIRole (REQUIP) 0.5 MG tablet at bedtime. 07/28/18   [provider]  sodium bicarbonate 650 MG tablet Take 1,300 mg by mouth 2 (two) times daily. 07/28/18   [provider]  Vitamin D, Ergocalciferol, (DRISDOL) 1.25 MG (50000  UNIT) CAPS capsule Take 1 capsule (50,000 Units total) by mouth every 7 (seven) days. 05/01/19   Cassandria Anger, MD     No family history on file.  Social History   Socioeconomic History   Marital status: Single    Spouse name: Not on file   Number of children: Not on file   Years of education: Not on file   Highest education level: Not on file  Occupational History   Not on file  Tobacco Use   Smoking  status: Never   Smokeless tobacco: Never  Vaping Use   Vaping Use: Never used  Substance and Sexual Activity   Alcohol use: Not Currently   Drug use: Never   Sexual activity: Not on file  Other Topics Concern   Not on file  Social History Narrative   Not on file   Social Determinants of Health   Financial Resource Strain: Not on file  Food Insecurity: Not on file  Transportation Needs: Not on file  Physical Activity: Not on file  Stress: Not on file  Social Connections: Not on file     Review of Systems: A 12 point ROS discussed and pertinent positives are indicated in the HPI above.  All other systems are negative.  Review of Systems  Constitutional:  Positive for fatigue. Negative for chills and fever.  Respiratory:  Negative for cough and shortness of breath.   Cardiovascular:  Negative for chest pain.  Gastrointestinal:  Negative for abdominal pain, nausea and vomiting.  Neurological:  Negative for dizziness and headaches.    Vital Signs: BP (!) 158/82 (BP Location: Left Arm)   Pulse 78   Temp 98.4 F (36.9 C) (Oral)   Resp 18   Ht 5\' 8"  (1.727 m)   Wt 275 lb 5.7 oz (124.9 kg)   SpO2 98%   BMI 41.87 kg/m   Physical Exam Vitals and nursing note reviewed.  Constitutional:      General: She is not in acute distress.    Appearance: She is ill-appearing.  HENT:     Head: Normocephalic.     Mouth/Throat:     Mouth: Mucous membranes are moist.     Pharynx: Oropharynx is clear. No oropharyngeal exudate or posterior oropharyngeal erythema.  Cardiovascular:     Rate and Rhythm: Normal rate and regular rhythm.  Pulmonary:     Effort: Pulmonary effort is normal.     Breath sounds: Normal breath sounds.  Abdominal:     General: There is no distension.     Palpations: Abdomen is soft.     Tenderness: There is no abdominal tenderness.  Skin:    General: Skin is warm and dry.  Neurological:     Mental Status: She is alert and oriented to person, place, and  time.  Psychiatric:        Mood and Affect: Mood normal.        Behavior: Behavior normal.        Thought Content: Thought content normal.        Judgment: Judgment normal.      MD Evaluation Airway: WNL Heart: WNL Abdomen: WNL Chest/ Lungs: WNL ASA  Classification: 3 Mallampati/Airway Score: Two   Imaging: No results found.  Labs:  CBC: Recent Labs    08/14/21 1331 08/15/21 0316  WBC 11.6* 9.4  HGB 7.8* 6.9*  HCT 25.5* 23.0*  PLT 250 232    COAGS: No results for input(s): "INR", "APTT" in the last 8760 hours.  BMP: Recent Labs    08/14/21 1331 08/15/21 0316  NA 134* 134*  K 3.7 4.1  CL 94* 96*  CO2 18* 19*  GLUCOSE 333* 315*  BUN 94* 98*  CALCIUM 4.2* 4.3*  CREATININE 19.37* 19.70*  GFRNONAA 2* 2*    LIVER FUNCTION TESTS: Recent Labs    08/14/21 1331 08/15/21 0316  ALBUMIN 2.7* 2.5*    TUMOR MARKERS: No results for input(s): "AFPTM", "CEA", "CA199", "CHROMGRNA" in the last 8760 hours.  Assessment and Plan:  41 y/o F with history of ESRD previously on HD (discontinued 2019, lost to follow up/non compliance until now) seen today for tunneled HD catheter in order to initiated hemodialysis.  Risks and benefits discussed with the patient including, but not limited to bleeding, infection, vascular injury, pneumothorax which may require chest tube placement, air embolism or even death.  All of the patient's questions were answered, patient is agreeable to proceed.  Consent signed and in chart.  Thank you for this interesting consult.  I greatly enjoyed meeting Sally Porter and look forward to participating in their care.  A copy of this report was sent to the requesting provider on this date.  Electronically Signed: Joaquim Nam, PA-C 08/15/2021, 10:34 AM   I spent a total of 20 Minutes in face to face in clinical consultation, greater than 50% of which was counseling/coordinating care for tunneled HD catheter.

## 2021-08-15 NOTE — Plan of Care (Signed)
  Problem: Clinical Measurements: Goal: Respiratory complications will improve Outcome: Progressing Goal: Cardiovascular complication will be avoided Outcome: Progressing   Problem: Coping: Goal: Level of anxiety will decrease Outcome: Progressing   

## 2021-08-15 NOTE — Procedures (Signed)
Interventional Radiology Procedure Note  Procedure: Image guided tunneled hemodialysis catheter placement  Findings: Please refer to procedural dictation for full description. 19 cm tunneled HD, tip in right atrium  Complications: None immediate  Estimated Blood Loss: < 5 mL  Recommendations: Catheter ready for immediate use.   Ruthann Cancer, MD

## 2021-08-15 NOTE — Progress Notes (Signed)
PROGRESS NOTE  Sally Porter  SWF:[id number] DOB: [birth year] DOA: 08/14/2021 PCP: Bridget Hartshorn, NP   Brief Narrative:  Patient is a 41 year old female with history of hypertension, diabetes type 2, hyperlipidemia, CKD stage IV, restless syndrome who presented to [facility] with complaints of nausea, vomiting, anorexia.  She was on dialysis in 2019 while in hospital then for only 2 weeks as an outpatient, she did not follow-up with nephrology after that because of noncompliance.  She has not seen a physician for the last 2 years.  [facility] was then transferred to [facility].  Blood work showed creatinine of 20.8.  CT abdomen chest was negative for any acute findings no hydronephrosis.  Case was discussed with nephrology and patient was transferred to [facility] for dialysis.  Patient is undergoing dialysis catheter placement with plan for initiating dialysis.  Nephrology following  Assessment & Plan:  Principal Problem:   Acute renal failure superimposed on stage 4 chronic kidney disease (HCC) Active Problems:   Mixed hyperlipidemia   Essential hypertension, benign   Intractable nausea and vomiting   Obesity, Class III, BMI 40-49.9 (morbid obesity) ([place])   Opioid dependence ([place])   Uncontrolled type 2 diabetes mellitus with hyperglycemia, with long-term current use of insulin (HCC)    AKI on CKD stage IV: Presented with nausea, vomiting, anorexia.  She was on dialysis in 2019 while in hospital then for only 2 weeks as an outpatient, she did not follow-up with nephrology after that because of noncompliance.  Lab work on presentation showed creatinine of 20.  She was frankly volume overloaded and uremic.  She was transferred to [facility] for initiation on dialysis. She is getting dialysis catheter placement today by IR, dialysis will be started, nephrology will continue to follow.  Severe hypocalcemia: Most likely secondary to CKD/ESRD/renal osteodystrophy.  Continue to  monitor  Anion gap metabolic acidosis: This is secondary to kidney disease.  Continue to monitor.  Might need to be started on sodium bicarb  Acute hypoxic respiratory failure: Requiring 2 L of oxygen.  Not on oxygen at home.  This is secondary to volume overload.  We will try to wean the oxygen  Normocytic anemia: Likely associated  with CKD.  Hemoglobin dropped to the range of 6 today.  No evidence of acute blood loss.  Given a unit of blood transfusion.  Continue to monitor CBC. She also microcytosis.  We will check anemia panel.  Uncontrolled diabetes type 2 with hyperglycemia: Continues to be hyperglycemic.  Uses insulin at home.  Continue current insulin regimen.  Monitor blood sugars.  Hemoglobin A1c of 10.  Will consult diabetic coordinator  Hypertension: Takes atenolol, lisinopril at home.  She is hypertensive.  Lisinopril discontinued.  We will add hydralazine.  Hyperlipidemia: Continue statin  Intractable nausea/vomiting: Most likely secondary to uremic symptoms, could be complaint of diabetic gastroparesis as well.  Continue supportive care  Morbid obesity: BMI 41.4  Opiate dependence: Takes Percocet at home.  Also receives alprazolam.  Started nortriptyline.          DVT prophylaxis:heparin injection 5,000 Units Start: 08/14/21 1415     Code Status: Full Code  Family Communication: None at bedside  Patient status:Inpatient  Patient is from :Home  Anticipated discharge [place]:[facility]  Estimated DC date:Not sure    Consultants: Neprhology  Procedures: Dialysis catheter placement  Antimicrobials:  Anti-infectives (From admission, onward)    Start     Dose/Rate Route Frequency Ordered Stop   08/15/21 1111  ceFAZolin (ANCEF)  IVPB 2g/100 mL premix        over 30 Minutes Intravenous Continuous PRN 08/15/21 1111         Subjective: Patient seen and examined at the bedside this morning.  Hemodynamically stable but hypertensive.  Overall looks comfortable.  Has  bilateral lower extremity edema.  Requiring some oxygen.  She was about to go for dialysis catheter placement.  Denies any abdomen pain, nausea, vomiting, chest pain or shortness of breath during my evaluation  Objective: Vitals:   08/15/21 1055 08/15/21 1100 08/15/21 1105 08/15/21 1110  BP: (!) 183/96 (!) 181/96 (!) 170/90 (!) 181/91  Pulse: 77 75 76 78  Resp: (!) 8 (!) 9 (!) 9 14  Temp:      TempSrc:      SpO2: 100% 100% 100% 100%  Weight:      Height:        Intake/Output Summary (Last 24 hours) at 08/15/2021 1115 Last data filed at 08/15/2021 0900 Gross per 24 hour  Intake 530 ml  Output 500 ml  Net 30 ml   Filed Weights   08/14/21 1132 08/15/21 0525  Weight: 123.7 kg 124.9 kg    Examination:  General exam: Overall comfortable, not in distress, obese HEENT: PERRL Respiratory system: Diminished sounds bilaterally Cardiovascular system: S1 & S2 heard, RRR.  Gastrointestinal system: Abdomen is nondistended, soft and nontender. Central nervous system: Alert and oriented Extremities :bilateral lower extremity  edema, no clubbing ,no cyanosis Skin: No rashes, no ulcers,no icterus     Data Reviewed: I have personally reviewed following labs and imaging studies  CBC: Recent Labs  Lab 08/14/21 1331 08/15/21 0316  WBC 11.6* 9.4  HGB 7.8* 6.9*  HCT 25.5* 23.0*  MCV 76.8* 77.4*  PLT 250 412   Basic Metabolic Panel: Recent Labs  Lab 08/14/21 1331 08/15/21 0316  NA 134* 134*  K 3.7 4.1  CL 94* 96*  CO2 18* 19*  GLUCOSE 333* 315*  BUN 94* 98*  CREATININE 19.37* 19.70*  CALCIUM 4.2* 4.3*  PHOS 8.4* 9.2*     Recent Results (from the past 240 hour(s))  MRSA Next Gen by PCR, Nasal     Status: None   Collection Time: 08/14/21 11:47 AM   Specimen: Nasal Mucosa; Nasal Swab  Result Value Ref Range Status   MRSA by PCR Next Gen NOT DETECTED NOT DETECTED Final    Comment: (NOTE) The GeneXpert MRSA Assay (FDA approved for NASAL specimens only), is one component of  a comprehensive MRSA colonization surveillance program. It is not intended to diagnose MRSA infection nor to guide or monitor treatment for MRSA infections. Test performance is not FDA approved in patients less than 35 years old. Performed at [facility], [address], [place], [place] [address]      Radiology Studies: No results found.  Scheduled Meds:  sodium chloride   Intravenous Once   atenolol  50 mg Oral Daily   atorvastatin  20 mg Oral q1800   calcitRIOL  0.25 mcg Oral Daily   Chlorhexidine Gluconate Cloth  6 each Topical Q0600   Chlorhexidine Gluconate Cloth  6 each Topical Q0600   ferrous sulfate  325 mg Oral Q breakfast   heparin  5,000 Units Subcutaneous Q8H   hydrocortisone cream   Topical BID   insulin aspart  0-5 Units Subcutaneous QHS   insulin aspart  0-9 Units Subcutaneous TID WC   insulin glargine-yfgn  25 Units Subcutaneous Daily   lidocaine-EPINEPHrine  nortriptyline  10 mg Oral BID   ondansetron (ZOFRAN) IV  4 mg Intravenous Q6H   pantoprazole (PROTONIX) IV  40 mg Intravenous Q12H   rOPINIRole  0.5 mg Oral QHS   Continuous Infusions:  ceFAZolin 2 g (08/15/21 1105)     LOS: 1 day   Shelly Coss, MD [facility] P6/17/2023, 11:15 AM

## 2021-08-15 NOTE — Progress Notes (Signed)
Patient's hgb 6.9, charge nurse at [facility] made aware.

## 2021-08-15 NOTE — Progress Notes (Signed)
[facility] night cross cover note:   I was notified by RN with the patient's request for a stronger pain medication as she reports discomfort at the site of her tunneled hemodialysis catheter that was placed earlier today by IR, with the patient noting no improvement in the intensity of this discomfort following dose of her home prn Percocet.   Most recent vital signs notable for blood pressure 175/73; respiratory rate 18, oxygen saturation 100% on room air.  In the setting of absolutely no improvement in the intensity of the above discomfort following dose of home Percocet in this patient with a degree of opioid tolerance in the setting of chronic pain syndrome, will discontinue home prn Percocet at this time and replace with as needed IV Dilaudid.     Babs Bertin, DO Hospitalist

## 2021-08-16 DIAGNOSIS — N185 Chronic kidney disease, stage 5: Secondary | ICD-10-CM

## 2021-08-16 DIAGNOSIS — N184 Chronic kidney disease, stage 4 (severe): Secondary | ICD-10-CM | POA: Diagnosis not present

## 2021-08-16 DIAGNOSIS — N179 Acute kidney failure, unspecified: Secondary | ICD-10-CM | POA: Diagnosis not present

## 2021-08-16 LAB — TYPE AND SCREEN
ABO/RH(D): A POS
Antibody Screen: NEGATIVE
Unit division: 0

## 2021-08-16 LAB — HEPATITIS B SURFACE ANTIBODY, QUANTITATIVE: Hep B S AB Quant (Post): <3.1 m[IU]/mL — ABNORMAL LOW (ref >9.9)

## 2021-08-16 LAB — BASIC METABOLIC PANEL
Anion gap: 16 — ABNORMAL HIGH (ref 5–15)
BUN: 63 mg/dL — ABNORMAL HIGH (ref 6–20)
CO2: 21 mmol/L — ABNORMAL LOW (ref 22–32)
Calcium: 5.2 mg/dL — CL (ref 8.9–10.3)
Chloride: 94 mmol/L — ABNORMAL LOW (ref 98–111)
Creatinine, Ser: 14.85 mg/dL — ABNORMAL HIGH (ref 0.44–1.00)
GFR, Estimated: 3 mL/min — ABNORMAL LOW (ref >60)
Glucose, Bld: 217 mg/dL — ABNORMAL HIGH (ref 70–99)
Potassium: 3.7 mmol/L (ref 3.5–5.1)
Sodium: 131 mmol/L — ABNORMAL LOW (ref 135–145)

## 2021-08-16 LAB — CBC
HCT: 26.3 % — ABNORMAL LOW (ref 36.0–46.0)
Hemoglobin: 8.3 g/dL — ABNORMAL LOW (ref 12.0–15.0)
MCH: 24.3 pg — ABNORMAL LOW (ref 26.0–34.0)
MCHC: 31.6 g/dL (ref 30.0–36.0)
MCV: 77.1 fL — ABNORMAL LOW (ref 80.0–100.0)
Platelets: 236 10*3/uL (ref 150–400)
RBC: 3.41 MIL/uL — ABNORMAL LOW (ref 3.87–5.11)
RDW: 17.1 % — ABNORMAL HIGH (ref 11.5–15.5)
WBC: 10.8 10*3/uL — ABNORMAL HIGH (ref 4.0–10.5)
nRBC: 0 % (ref 0.0–0.2)

## 2021-08-16 LAB — BPAM RBC
Blood Product Expiration Date: 202307052359
ISSUE DATE / TIME: 202306171638
Unit Type and Rh: 6200

## 2021-08-16 LAB — GLUCOSE, CAPILLARY
Glucose-Capillary: 218 mg/dL — ABNORMAL HIGH (ref 70–99)
Glucose-Capillary: 372 mg/dL — ABNORMAL HIGH (ref 70–99)
Glucose-Capillary: 199 mg/dL — ABNORMAL HIGH (ref 70–99)
Glucose-Capillary: 140 mg/dL — ABNORMAL HIGH (ref 70–99)

## 2021-08-16 LAB — VITAMIN D 25 HYDROXY (VIT D DEFICIENCY, FRACTURES): Vit D, 25-Hydroxy: 9.17 ng/mL — ABNORMAL LOW (ref 30–100)

## 2021-08-16 MED ORDER — CALCIUM GLUCONATE-NACL 1-0.675 GM/50ML-% IV SOLN
1.0000 g | Freq: Once | INTRAVENOUS | Status: AC
  Administered 2021-08-16: 1000 mg via INTRAVENOUS
  Filled 2021-08-16: qty 50

## 2021-08-16 MED ORDER — LIVING WELL WITH DIABETES BOOK
Freq: Once | Status: AC
  Filled 2021-08-16: qty 1

## 2021-08-16 MED ORDER — SEVELAMER CARBONATE 800 MG PO TABS
800.0000 mg | ORAL_TABLET | Freq: Three times a day (TID) | ORAL | Status: DC
  Administered 2021-08-16 – 2021-08-17 (×4): 800 mg via ORAL
  Filled 2021-08-16 (×4): qty 1

## 2021-08-16 MED ORDER — SODIUM CHLORIDE 0.9 % IV SOLN
250.0000 mg | INTRAVENOUS | Status: DC
  Administered 2021-08-17 – 2021-08-19 (×2): 250 mg via INTRAVENOUS
  Filled 2021-08-16 (×2): qty 20

## 2021-08-16 MED ORDER — POLYETHYLENE GLYCOL 3350 17 G PO PACK
17.0000 g | PACK | Freq: Every day | ORAL | Status: DC
Start: 2021-08-16 — End: 2021-08-19
  Administered 2021-08-16 – 2021-08-18 (×2): 17 g via ORAL
  Filled 2021-08-16 (×3): qty 1

## 2021-08-16 NOTE — Progress Notes (Signed)
[facility] Progress Note   41 y.o. female HTN, DM, HLD, chronic pain, restless leg syndrome, presenting with nausea and vomiting for 4-5 days. She was on dialysis in 2019 while in the hospital and then for only a few weeks as an outpatient unit. She has not followed up with any nephrologist since then because of admitted noncompliance. BL CKD4 as far back as 2019 and now ESRD.   Assessment/ Plan:   ESRD - newly diagnosed with creatinine already in the 4's when she was taken off dialysis in 2019, then lost to follow up because of admitted pt noncompliance. - Will check urine studies; no need for an u/s with CT already ruling out hydronephrosis. - Appreciate VIR placing TC 6/17; tolerated HD#1 on 6/17 with 2.5L UF. No further nausea. Planning on HD #2 on Monday and will need to start CLIP process on Monday.   - Avoid nephrotoxic medications including NSAIDs and iodinated intravenous contrast exposure unless the latter is absolutely indicated.   - Preferred narcotic agents for pain control are hydromorphone, fentanyl, and methadone. Morphine should not be used.  - Avoid Baclofen and avoid oral sodium phosphate and magnesium citrate based laxatives / bowel preps.  - Continue strict Input and Output monitoring. Will monitor the patient closely with you and intervene or adjust therapy as indicated by changes in clinical status/labs   Renal osteodystrophy - phos (9.2 -> 6.6) and also iPTH pending, 25vitD ordered; I suspect the phos will be elevated given the hypocalcemia. Will also give Ca Gluconate x1 and also started on calcitriol.  Anemia - iron panel TSAT 9%; will load with IV iron on dialysis and start ESA after partial load. S/p transfusion. Uncontrolled type 2 diabetes - on ISS per primary. Obesity HTN - once she starts dialysis lisinopril will be fine.  Subjective:   Feeling much better, denies sob/ n/v.    Objective:   BP (!) 161/74 (BP Location: Left Arm)   Pulse 76    Temp 98.4 F (36.9 C) (Oral)   Resp 18   Ht '5\' 8"'  (1.727 m)   Wt 126.5 kg   SpO2 100%   BMI 42.40 kg/m   Intake/Output Summary (Last 24 hours) at 08/16/2021 0741 Last data filed at 08/16/2021 0600 Gross per 24 hour  Intake 825 ml  Output 3350 ml  Net -2525 ml   Weight change: 2.8 kg  Physical Exam: GEN: NAD, A&Ox3, NCAT HEENT: No conjunctival pallor, EOMI NECK: Supple, no thyromegaly LUNGS: CTA B/L no rales, rhonchi or wheezing CV: RRR, No M/R/G ABD: SNDNT +BS  EXT: Tr-1+ lower extremity edema ACCESS: RIJ TC (tender)  Imaging: IR Fluoro Guide CV Line Right  Result Date: 08/15/2021 INDICATION: 41 year old female with end-stage renal disease requiring central venous access for hemodialysis. EXAM: TUNNELED CENTRAL VENOUS HEMODIALYSIS CATHETER PLACEMENT WITH ULTRASOUND AND FLUOROSCOPIC GUIDANCE MEDICATIONS: Ancef 2 gm IV . The antibiotic was given in an appropriate time interval prior to skin puncture. ANESTHESIA/SEDATION: Moderate (conscious) sedation was employed during this procedure. A total of Versed 2 mg and Fentanyl 100 mcg was administered intravenously. Moderate Sedation Time: 11 minutes. The patient's level of consciousness and vital signs were monitored continuously by radiology nursing throughout the procedure under my direct supervision. FLUOROSCOPY TIME:  0 minutes 6 seconds (1 mGy). COMPLICATIONS: None immediate. PROCEDURE: Informed written consent was obtained from the patient after a discussion of the risks, benefits, and alternatives to treatment. Questions regarding the procedure were encouraged and answered. The right neck and  chest were prepped with chlorhexidine in a sterile fashion, and a sterile drape was applied covering the operative field. Maximum barrier sterile technique with sterile gowns and gloves were used for the procedure. A timeout was performed prior to the initiation of the procedure. After creating a small venotomy incision, a 21 gauge micropuncture kit  was utilized to access the internal jugular vein. Real-time ultrasound guidance was utilized for vascular access including the acquisition of a permanent ultrasound image documenting patency of the accessed vessel. A Rosen wire was advanced to the level of the IVC and the micropuncture sheath was exchanged for an 8 Fr dilator. A 14.5 French tunneled hemodialysis catheter measuring 19 cm from tip to cuff was tunneled in a retrograde fashion from the anterior chest wall to the venotomy incision. Serial dilation was then performed an a peel-away sheath was placed. The catheter was then placed through the peel-away sheath with the catheter tip ultimately positioned within the right atrium. Final catheter positioning was confirmed and documented with a spot radiographic image. The catheter aspirates and flushes normally. The catheter was flushed with appropriate volume heparin dwells. The catheter exit site was secured with a 0-Silk retention suture. The venotomy incision was closed with Dermabond. Sterile dressings were applied. The patient tolerated the procedure well without immediate post procedural complication. IMPRESSION: Successful placement of 19 cm tip to cuff tunneled hemodialysis catheter via the right internal jugular vein with catheter tip terminating within the right atrium. The catheter is ready for immediate use. Ruthann Cancer, MD [facility] Radiology Electronically Signed   By: Ruthann Cancer M.D.   On: 08/15/2021 15:28   IR US Guide Vasc Access Right  Result Date: 08/15/2021 INDICATION: 41 year old female with end-stage renal disease requiring central venous access for hemodialysis. EXAM: TUNNELED CENTRAL VENOUS HEMODIALYSIS CATHETER PLACEMENT WITH ULTRASOUND AND FLUOROSCOPIC GUIDANCE MEDICATIONS: Ancef 2 gm IV . The antibiotic was given in an appropriate time interval prior to skin puncture. ANESTHESIA/SEDATION: Moderate (conscious) sedation was  employed during this procedure. A total of Versed 2 mg and Fentanyl 100 mcg was administered intravenously. Moderate Sedation Time: 11 minutes. The patient's level of consciousness and vital signs were monitored continuously by radiology nursing throughout the procedure under my direct supervision. FLUOROSCOPY TIME:  0 minutes 6 seconds (1 mGy). COMPLICATIONS: None immediate. PROCEDURE: Informed written consent was obtained from the patient after a discussion of the risks, benefits, and alternatives to treatment. Questions regarding the procedure were encouraged and answered. The right neck and chest were prepped with chlorhexidine in a sterile fashion, and a sterile drape was applied covering the operative field. Maximum barrier sterile technique with sterile gowns and gloves were used for the procedure. A timeout was performed prior to the initiation of the procedure. After creating a small venotomy incision, a 21 gauge micropuncture kit was utilized to access the internal jugular vein. Real-time ultrasound guidance was utilized for vascular access including the acquisition of a permanent ultrasound image documenting patency of the accessed vessel. A Rosen wire was advanced to the level of the IVC and the micropuncture sheath was exchanged for an 8 Fr dilator. A 14.5 French tunneled hemodialysis catheter measuring 19 cm from tip to cuff was tunneled in a retrograde fashion from the anterior chest wall to the venotomy incision. Serial dilation was then performed an a peel-away sheath was placed. The catheter was then placed through the peel-away sheath with the catheter tip ultimately positioned within the right atrium. Final catheter positioning  was confirmed and documented with a spot radiographic image. The catheter aspirates and flushes normally. The catheter was flushed with appropriate volume heparin dwells. The catheter exit site was secured with a 0-Silk retention suture. The venotomy incision was closed  with Dermabond. Sterile dressings were applied. The patient tolerated the procedure well without immediate post procedural complication. IMPRESSION: Successful placement of 19 cm tip to cuff tunneled hemodialysis catheter via the right internal jugular vein with catheter tip terminating within the right atrium. The catheter is ready for immediate use. Ruthann Cancer, MD [facility] Radiology Electronically Signed   By: Ruthann Cancer M.D.   On: 08/15/2021 15:28    Labs: BMET Recent Labs  Lab 08/14/21 1331 08/15/21 0316 08/15/21 1600 08/16/21 0303  NA 134* 134*  --  131*  K 3.7 4.1  --  3.7  CL 94* 96*  --  94*  CO2 18* 19*  --  21*  GLUCOSE 333* 315*  --  217*  BUN 94* 98*  --  63*  CREATININE 19.37* 19.70*  --  14.85*  CALCIUM 4.2* 4.3*  --  5.2*  PHOS 8.4* 9.2* 6.6*  --    CBC Recent Labs  Lab 08/14/21 1331 08/15/21 0316 08/16/21 0303  WBC 11.6* 9.4 10.8*  HGB 7.8* 6.9* 8.3*  HCT 25.5* 23.0* 26.3*  MCV 76.8* 77.4* 77.1*  PLT 250 232 236    Medications:     sodium chloride   Intravenous Once   atenolol  50 mg Oral Daily   atorvastatin  20 mg Oral q1800   calcitRIOL  0.25 mcg Oral Daily   Chlorhexidine Gluconate Cloth  6 each Topical Q0600   Chlorhexidine Gluconate Cloth  6 each Topical Q0600   ferrous sulfate  325 mg Oral Q breakfast   heparin  5,000 Units Subcutaneous Q8H   hydrALAZINE  50 mg Oral Q8H   hydrocortisone cream   Topical BID   insulin aspart  0-20 Units Subcutaneous TID WC   insulin aspart  0-5 Units Subcutaneous QHS   insulin aspart  7 Units Subcutaneous TID WC   insulin glargine-yfgn  35 Units Subcutaneous Daily   nortriptyline  10 mg Oral BID   ondansetron (ZOFRAN) IV  4 mg Intravenous Q6H   pantoprazole (PROTONIX) IV  40 mg Intravenous Q12H   rOPINIRole  0.5 mg Oral QHS      Otelia Santee, MD 08/16/2021, 7:41 AM

## 2021-08-16 NOTE — Inpatient Diabetes Management (Signed)
Inpatient Diabetes Program Recommendations  AACE/ADA: New Consensus Statement on Inpatient Glycemic Control (2015)  Target Ranges:  Prepandial:   less than 140 mg/dL      Peak postprandial:   less than 180 mg/dL (1-2 hours)      Critically ill patients:  140 - 180 mg/dL   Lab Results  Component Value Date   GLUCAP 91 08/15/2021   HGBA1C 10.0 (H) 08/14/2021    Review of Glycemic Control  Latest Reference Range & Units 08/15/21 05:17 08/15/21 07:28 08/15/21 10:00 08/15/21 11:45 08/15/21 20:40  Glucose-Capillary 70 - 99 mg/dL 347 (H) 403 (H) 375 (H) 338 (H) 91  (H): Data is abnormally high  Diabetes history: DM2 Outpatient Diabetes medications: Lantus 30 QHS, Humalog 5-11 TID Current orders for Inpatient glycemic control: Semglee 35 units, Novolog 5-11 units tid, Novolog 0-20 units tid correction , 0-5 units hs  Inpatient Diabetes Program Recommendations:   Please consider: -Decrease Novolog correction to 0-9 units tid, 0-5 hs Secure chat sent to Dr. Tawanna Solo.  Thank you, Nani Gasser. Lashane Whelpley, RN, MSN, CDE  Diabetes Coordinator Inpatient Glycemic Control Team Team Pager [phone] (8am-5pm) 08/16/2021 7:17 AM

## 2021-08-16 NOTE — Progress Notes (Signed)
[facility] [place]  ASSESSMENT / PLAN: 41 y.o. female with new diagnosis of ESRD. In need of permanent dialysis access. [place] already in place. Plan permanent dialysis access early this week. Vein mapping pending.  CHIEF COMPLAINT: new diagnosis of ESRD  HISTORY OF PRESENT ILLNESS: Sally Porter is a 41 y.o. female with new diagnosis of ESRD. She is right handed. No history of ICD / Pacemaker. No trauma to upper extremities.   VASCULAR SURGICAL HISTORY: none  VASCULAR RISK FACTORS: Negative history of stroke / transient ischemic attack. Negative history of coronary artery disease.  Positive history of diabetes mellitus.  Negative history of smoking.  Positive history of hypertension.  Positive history of chronic kidney disease.   Negative history of chronic obstructive pulmonary disease.  FUNCTIONAL STATUS: ECOG performance status: (0) Fully active, able to carry on all predisease performance without restriction Ambulatory status: Ambulatory within the community without limits  Past Medical History:  Diagnosis Date   Diabetes mellitus without complication ([place])    Diabetes mellitus, type II ([place])    Glaucoma    Hyperlipidemia    Hypertension     Past Surgical History:  Procedure Laterality Date   IR FLUORO GUIDE CV LINE RIGHT  08/15/2021   IR US GUIDE VASC ACCESS RIGHT  08/15/2021    No family history on file.  Social History   Socioeconomic History   Marital status: Single    Spouse name: Not on file   Number of children: Not on file   Years of education: Not on file   Highest education level: Not on file  Occupational History   Not on file  Tobacco Use   Smoking status: Never   Smokeless tobacco: Never  Vaping Use   Vaping Use: Never used  Substance and Sexual Activity   Alcohol use: Not Currently   Drug use: Never   Sexual activity: Not on file  Other Topics Concern   Not on file  Social History Narrative   Not on file    Social Determinants of Health   Financial Resource Strain: Not on file  Food Insecurity: Not on file  Transportation Needs: Not on file  Physical Activity: Not on file  Stress: Not on file  Social Connections: Not on file  Intimate Partner Violence: Not on file    Allergies  Allergen Reactions   Dilaudid [Hydromorphone] Itching   Lyrica [Pregabalin] Itching   Morphine And Related Itching    Current Facility-Administered Medications  Medication Dose Route Frequency Provider Last Rate Last Admin   0.9 %  sodium chloride infusion (Manually program via Guardrails IV Fluids)   Intravenous Once Shelly Coss, MD       acetaminophen (TYLENOL) tablet 650 mg  650 mg Oral Q6H PRN Tat, David, MD       Or   acetaminophen (TYLENOL) suppository 650 mg  650 mg Rectal Q6H PRN Tat, Shanon Brow, MD       ALPRAZolam Duanne Moron) tablet 0.5 mg  0.5 mg Oral BID PRN Orson Eva, MD   0.5 mg at 08/15/21 2132   atenolol (TENORMIN) tablet 50 mg  50 mg Oral Daily Tat, David, MD   50 mg at 08/15/21 1757   atorvastatin (LIPITOR) tablet 20 mg  20 mg Oral q1800 Orson Eva, MD   20 mg at 08/15/21 2132   calcitRIOL (ROCALTROL) capsule 0.25 mcg  0.25 mcg Oral Daily Dwana Melena, MD   0.25 mcg at 08/15/21 2143   calcium gluconate 1  g/ 50 mL sodium chloride IVPB  1 g Intravenous Once Dwana Melena, MD       Chlorhexidine Gluconate Cloth 2 % PADS 6 each  6 each Topical Q0600 Tat, Shanon Brow, MD       Chlorhexidine Gluconate Cloth 2 % PADS 6 each  6 each Topical Q0600 Dwana Melena, MD   6 each at 08/15/21 0816   diphenhydrAMINE (BENADRYL) capsule 25 mg  25 mg Oral Q6H PRN Howerter, Justin B, DO       [START ON 08/17/2021] ferric gluconate (FERRLECIT) 250 mg in sodium chloride 0.9 % 250 mL IVPB  250 mg Intravenous Q M,W,F-HD Dwana Melena, MD       heparin injection 5,000 Units  5,000 Units Subcutaneous Franco Collet, MD   5,000 Units at 08/16/21 0657   hydrALAZINE (APRESOLINE) tablet 50 mg  50 mg Oral Q8H Adhikari, Amrit, MD   50  mg at 08/16/21 0045   hydrocortisone cream 1 %   Topical BID Adefeso, Oladapo, DO   Given at 08/15/21 2133   HYDROmorphone (DILAUDID) injection 0.5 mg  0.5 mg Intravenous Q2H PRN Howerter, Justin B, DO   0.5 mg at 08/16/21 0657   insulin aspart (novoLOG) injection 0-20 Units  0-20 Units Subcutaneous TID WC Shelly Coss, MD   15 Units at 08/15/21 1213   insulin aspart (novoLOG) injection 0-5 Units  0-5 Units Subcutaneous QHS Adhikari, Amrit, MD       insulin aspart (novoLOG) injection 7 Units  7 Units Subcutaneous TID WC Shelly Coss, MD   7 Units at 08/15/21 1201   insulin glargine-yfgn (SEMGLEE) injection 35 Units  35 Units Subcutaneous Daily Shelly Coss, MD   35 Units at 08/15/21 1208   naloxone [facility]) injection 0.4 mg  0.4 mg Intravenous PRN Howerter, Justin B, DO       nortriptyline (PAMELOR) capsule 10 mg  10 mg Oral BID Tat, David, MD   10 mg at 08/15/21 2215   ondansetron (ZOFRAN) tablet 4 mg  4 mg Oral Q6H PRN Tat, David, MD       Or   ondansetron (ZOFRAN) injection 4 mg  4 mg Intravenous Q6H PRN Tat, David, MD       ondansetron [facility]) injection 4 mg  4 mg Intravenous Nicholes Calamity, MD   4 mg at 08/16/21 6967   Oral care mouth rinse  15 mL Mouth Rinse PRN Tat, Shanon Brow, MD       pantoprazole (PROTONIX) injection 40 mg  40 mg Intravenous Therisa Doyne, MD   40 mg at 08/15/21 2134   rOPINIRole (REQUIP) tablet 0.5 mg  0.5 mg Oral QHS Orson Eva, MD   0.5 mg at 08/15/21 2132   sevelamer carbonate (RENVELA) tablet 800 mg  800 mg Oral TID WC Dwana Melena, MD        PHYSICAL EXAM Vitals:   08/15/21 1830 08/15/21 2040 08/16/21 0043 08/16/21 0529  BP: (!) 141/130 (!) 175/75 119/71 (!) 161/74  Pulse: 78 85 79 76  Resp: 14 18 19 18   Temp:  98.2 F (36.8 C) 99.4 F (37.4 C) 98.4 F (36.9 C)  TempSrc:   Oral Oral  SpO2: 97% 100% 90% 100%  Weight:      Height:        Constitutional: chronically ill appearing. Appears hypervolemic.  Cardiac: regular rate and rhythm.   Respiratory:  unlabored. Abdominal:  soft, non-tender, non-distended.  Peripheral vascular: 2+ radial pulses.  PERTINENT LABORATORY  AND RADIOLOGIC DATA  Most recent CBC    Latest Ref Rng & Units 08/16/2021    3:03 AM 08/15/2021    3:16 AM 08/14/2021    1:31 PM  CBC  WBC 4.0 - 10.5 K/uL 10.8  9.4  11.6   Hemoglobin 12.0 - 15.0 g/dL 8.3  6.9  7.8   Hematocrit 36.0 - 46.0 % 26.3  23.0  25.5   Platelets 150 - 400 K/uL 236  232  250      Most recent CMP    Latest Ref Rng & Units 08/16/2021    3:03 AM 08/15/2021    3:16 AM 08/14/2021    1:31 PM  CMP  Glucose 70 - 99 mg/dL 217  315  333   BUN 6 - 20 mg/dL 63  98  94   Creatinine 0.44 - 1.00 mg/dL 14.85  19.70  19.37   Sodium 135 - 145 mmol/L 131  134  134   Potassium 3.5 - 5.1 mmol/L 3.7  4.1  3.7   Chloride 98 - 111 mmol/L 94  96  94   CO2 22 - 32 mmol/L 21  19  18    Calcium 8.9 - 10.3 mg/dL 5.2  4.3  4.2     Renal function Estimated Creatinine Clearance: 7.1 mL/min (A) (by C-G formula based on SCr of 14.85 mg/dL (H)).  Hemoglobin A1C (no units)  Date Value  04/27/2019 12.5   Hgb A1c MFr Bld (%)  Date Value  08/14/2021 10.0 (H)    LDL Cholesterol (Calc)  Date Value Ref Range Status  08/10/2018 122 (H) mg/dL (calc) Final    Comment:    Reference range: <100 . Desirable range <100 mg/dL for primary prevention;   <70 mg/dL for patients with CHD or diabetic patients  with > or = 2 CHD risk factors. Marland Kitchen LDL-C is now calculated using the Martin-Hopkins  calculation, which is a validated novel method providing  better accuracy than the Friedewald equation in the  estimation of LDL-C.  Cresenciano Genre et al. Annamaria Helling. 7672;094(70): 2061-2068  (http://education.[employer].com/faq/FAQ164)     Vein mapping pending  Yevonne Aline. Stanford Breed, MD [facility] Phone Number: [phone] 08/16/2021 10:39 AM  Total time spent on preparing this encounter including chart review, data review,  collecting history, examining the patient, coordinating care for this new patient, 60 minutes.  Portions of this report may have been transcribed using voice recognition software.  Every effort has been made to ensure accuracy; however, inadvertent computerized transcription errors may still be present.

## 2021-08-16 NOTE — Inpatient Diabetes Management (Signed)
Inpatient Diabetes Program Recommendations  AACE/ADA: New Consensus Statement on Inpatient Glycemic Control (2015)  Target Ranges:  Prepandial:   less than 140 mg/dL      Peak postprandial:   less than 180 mg/dL (1-2 hours)      Critically ill patients:  140 - 180 mg/dL   Lab Results  Component Value Date   GLUCAP 372 (H) 08/16/2021   HGBA1C 10.0 (H) 08/14/2021    Review of Glycemic Control  Diabetes history: DM2 Outpatient Diabetes medications: Lantus 34 QHS, Novollog 5-11 TID and sometimes takes Novolin Relion from [employer] slicing scale Current orders for Inpatient glycemic control: Semglee 35 units, Novolog 7 units tid, Novolog 0-20 units tid correction , 0-5 units hs  Inpatient Diabetes Program Recommendations:   Please consider: -Increase Novolog meal coverage to 8 units tid  Spoke with pt via phone (DM coordinator covering call) about new diagnosis. Discussed A1C results 10.0 (average blood glucose 240 over the past 2-3 months) and explained what an A1C is, basic pathophysiology of DM Type 2, basic home care, basic diabetes diet nutrition principles, importance of checking CBGs and maintaining good CBG control to prevent long-term and short-term complications. Reviewed signs and symptoms of hyperglycemia and hypoglycemia and how to treat hypoglycemia at home. Also reviewed blood sugar goals at home.  RNs to provide ongoing basic DM education at bedside with this patient. Have ordered educational booklet.  Patient states "I quit my PCP because I didn't like him" and currently no MD regarding diabetes management. Patient sometimes picks up Novolin Relion R insulin and administers to "sliding scale". Will follow and plan to speak with patient in am. Consulted TOC regarding need for PCP.  Thank you, Nani Gasser. Henrietta Cieslewicz, RN, MSN, CDE  Diabetes Coordinator Inpatient Glycemic Control Team Team Pager [phone] (8am-5pm) 08/16/2021 2:19 PM

## 2021-08-16 NOTE — Plan of Care (Signed)

## 2021-08-16 NOTE — Progress Notes (Signed)
PROGRESS NOTE  Sally Porter  ZOX:[id number] DOB: May 07, 1980 DOA: 08/14/2021 PCP: Bridget Hartshorn, NP   Brief Narrative:  Patient is a 41 year old female with history of hypertension, diabetes type 2, hyperlipidemia, CKD stage IV, restless syndrome who presented to [facility] with complaints of nausea, vomiting, anorexia.  She was on dialysis in 2019 while in hospital then for only 2 weeks as an outpatient, she did not follow-up with nephrology after that because of noncompliance.  She has not seen a physician for the last 2 years.  [facility] was then [facility].  Blood work showed creatinine of 20.8.  CT abdomen chest was negative for any acute findings no hydronephrosis.  Case was discussed with nephrology and patient was transferred to [facility] for dialysis. Underwent dialysis catheter placement ,initiated dialysis.  Nephrology following  Assessment & Plan:  Principal Problem:   Acute renal failure superimposed on stage 4 chronic kidney disease (HCC) Active Problems:   Mixed hyperlipidemia   Essential hypertension, benign   Intractable nausea and vomiting   Obesity, Class III, BMI 40-49.9 (morbid obesity) ([place])   Opioid dependence ([place])   Uncontrolled type 2 diabetes mellitus with hyperglycemia, with long-term current use of insulin (HCC)    AKI on CKD stage IV: Presented with nausea, vomiting, anorexia.  She was on dialysis in 2019 while in hospital then for only 2 weeks as an outpatient, she did not follow-up with nephrology after that because of noncompliance.  Lab work on presentation showed creatinine of 20.  She was frankly volume overloaded and uremic.  She was transferred to [facility] for initiation on dialysis. Dialysis started after placement of hemodialysis catheter.  Vascular surgery also following for placement of permanent vascular access for dialysis.  Continue phosphate binders  Severe hypocalcemia: Most likely secondary to CKD/ESRD/renal  osteodystrophy.  Continue to monitor  Anion gap metabolic acidosis: This is secondary to kidney disease.  Continue to monitor.  Might need to be started on sodium bicarb  Acute hypoxic respiratory failure: Requiring 2 L of oxygen.  Not on oxygen at home.  This is secondary to volume overload.  We will try to wean the oxygen  Normocytic anemia: Likely associated  with CKD.  Hemoglobin dropped to the range of 6 .  No evidence of acute blood loss.  Given a unit of blood transfusion.  Continue to monitor CBC.Now stable at 8 She also microcytosis.  Anemia panel showed low iron, will start on IV iron  Uncontrolled diabetes type 2 with hyperglycemia: Continues to be hyperglycemic.  Uses insulin at home.  Continue current insulin regimen.  Monitor blood sugars.  Hemoglobin A1c of 10.  Consulted diabetic coordinator  Hypertension: Takes atenolol, lisinopril at home.  She is hypertensive.  Lisinopril discontinued.  We added hydralazine.  Hyperlipidemia: Continue statin  Intractable nausea/vomiting: Most likely secondary to uremic symptoms, could be complaint of diabetic gastroparesis as well.  Continue supportive care.  Resolved  Morbid obesity: BMI 41.4  Opiate dependence: Takes Percocet at home.  Also receives alprazolam.  Started nortriptyline.          DVT prophylaxis:heparin injection 5,000 Units Start: 08/14/21 1415     Code Status: Full Code  Family Communication: None at bedside  Patient status:Inpatient  Patient is from :Home  Anticipated discharge WJ:XBJY  Estimated DC date:Not sure    Consultants: Neprhology  Procedures: Dialysis catheter placement, dialysis  Antimicrobials:  Anti-infectives (From admission, onward)    Start     Dose/Rate Route Frequency  Ordered Stop   08/15/21 1111  ceFAZolin (ANCEF) IVPB 2g/100 mL premix        over 30 Minutes Intravenous Continuous PRN 08/15/21 1111 08/15/21 1105       Subjective: Patient seen and examined at the bedside  this morning.  Hemodynamically stable without any complaints today.    Objective: Vitals:   08/15/21 1830 08/15/21 2040 08/16/21 0043 08/16/21 0529  BP: (!) 141/130 (!) 175/75 119/71 (!) 161/74  Pulse: 78 85 79 76  Resp: '14 18 19 18  ' Temp:  98.2 F (36.8 C) 99.4 F (37.4 C) 98.4 F (36.9 C)  TempSrc:   Oral Oral  SpO2: 97% 100% 90% 100%  Weight:      Height:        Intake/Output Summary (Last 24 hours) at 08/16/2021 1125 Last data filed at 08/16/2021 0900 Gross per 24 hour  Intake 1185 ml  Output 3350 ml  Net -2165 ml   Filed Weights   08/14/21 1132 08/15/21 0525 08/15/21 1617  Weight: 123.7 kg 124.9 kg 126.5 kg    Examination:   General exam: Overall comfortable, not in distress, obese HEENT: PERRL Respiratory system: Diminished sounds bilaterally on bases, no wheezes or crackles  Cardiovascular system: S1 & S2 heard, RRR.  Dialysis catheter on the right chest Gastrointestinal system: Abdomen is nondistended, soft and nontender. Central nervous system: Alert and oriented Extremities: Bilateral lower extremity edema, no clubbing ,no cyanosis Skin: No rashes, no ulcers,no icterus     Data Reviewed: I have personally reviewed following labs and imaging studies  CBC: Recent Labs  Lab 08/14/21 1331 08/15/21 0316 08/16/21 0303  WBC 11.6* 9.4 10.8*  HGB 7.8* 6.9* 8.3*  HCT 25.5* 23.0* 26.3*  MCV 76.8* 77.4* 77.1*  PLT 250 232 099   Basic Metabolic Panel: Recent Labs  Lab 08/14/21 1331 08/15/21 0316 08/15/21 1600 08/16/21 0303  NA 134* 134*  --  131*  K 3.7 4.1  --  3.7  CL 94* 96*  --  94*  CO2 18* 19*  --  21*  GLUCOSE 333* 315*  --  217*  BUN 94* 98*  --  63*  CREATININE 19.37* 19.70*  --  14.85*  CALCIUM 4.2* 4.3*  --  5.2*  PHOS 8.4* 9.2* 6.6*  --      Recent Results (from the past 240 hour(s))  MRSA Next Gen by PCR, Nasal     Status: None   Collection Time: 08/14/21 11:47 AM   Specimen: Nasal Mucosa; Nasal Swab  Result Value Ref Range  Status   MRSA by PCR Next Gen NOT DETECTED NOT DETECTED Final    Comment: (NOTE) The GeneXpert MRSA Assay (FDA approved for NASAL specimens only), is one component of a comprehensive MRSA colonization surveillance program. It is not intended to diagnose MRSA infection nor to guide or monitor treatment for MRSA infections. Test performance is not FDA approved in patients less than 29 years old. Performed at [facility], [address] [place], [place] [address]      Radiology Studies: IR Fluoro Guide CV Line Right  Result Date: 08/15/2021 INDICATION: 41 year old female with end-stage renal disease requiring central venous access for hemodialysis. EXAM: TUNNELED CENTRAL VENOUS HEMODIALYSIS CATHETER PLACEMENT WITH ULTRASOUND AND FLUOROSCOPIC GUIDANCE MEDICATIONS: Ancef 2 gm IV . The antibiotic was given in an appropriate time interval prior to skin puncture. ANESTHESIA/SEDATION: Moderate (conscious) sedation was employed during this procedure. A total of Versed 2 mg and Fentanyl 100 mcg was administered intravenously.  Moderate Sedation Time: 11 minutes. The patient's level of consciousness and vital signs were monitored continuously by radiology nursing throughout the procedure under my direct supervision. FLUOROSCOPY TIME:  0 minutes 6 seconds (1 mGy). COMPLICATIONS: None immediate. PROCEDURE: Informed written consent was obtained from the patient after a discussion of the risks, benefits, and alternatives to treatment. Questions regarding the procedure were encouraged and answered. The right neck and chest were prepped with chlorhexidine in a sterile fashion, and a sterile drape was applied covering the operative field. Maximum barrier sterile technique with sterile gowns and gloves were used for the procedure. A timeout was performed prior to the initiation of the procedure. After creating a small venotomy incision, a 21 gauge micropuncture kit was utilized to access the internal jugular vein.  Real-time ultrasound guidance was utilized for vascular access including the acquisition of a permanent ultrasound image documenting patency of the accessed vessel. A Rosen wire was advanced to the level of the IVC and the micropuncture sheath was exchanged for an 8 Fr dilator. A 14.5 French tunneled hemodialysis catheter measuring 19 cm from tip to cuff was tunneled in a retrograde fashion from the anterior chest wall to the venotomy incision. Serial dilation was then performed an a peel-away sheath was placed. The catheter was then placed through the peel-away sheath with the catheter tip ultimately positioned within the right atrium. Final catheter positioning was confirmed and documented with a spot radiographic image. The catheter aspirates and flushes normally. The catheter was flushed with appropriate volume heparin dwells. The catheter exit site was secured with a 0-Silk retention suture. The venotomy incision was closed with Dermabond. Sterile dressings were applied. The patient tolerated the procedure well without immediate post procedural complication. IMPRESSION: Successful placement of 19 cm tip to cuff tunneled hemodialysis catheter via the right internal jugular vein with catheter tip terminating within the right atrium. The catheter is ready for immediate use. Ruthann Cancer, MD [facility] Radiology Electronically Signed   By: Ruthann Cancer M.D.   On: 08/15/2021 15:28   IR US Guide Vasc Access Right  Result Date: 08/15/2021 INDICATION: 41 year old female with end-stage renal disease requiring central venous access for hemodialysis. EXAM: TUNNELED CENTRAL VENOUS HEMODIALYSIS CATHETER PLACEMENT WITH ULTRASOUND AND FLUOROSCOPIC GUIDANCE MEDICATIONS: Ancef 2 gm IV . The antibiotic was given in an appropriate time interval prior to skin puncture. ANESTHESIA/SEDATION: Moderate (conscious) sedation was employed during this procedure. A total of Versed 2 mg  and Fentanyl 100 mcg was administered intravenously. Moderate Sedation Time: 11 minutes. The patient's level of consciousness and vital signs were monitored continuously by radiology nursing throughout the procedure under my direct supervision. FLUOROSCOPY TIME:  0 minutes 6 seconds (1 mGy). COMPLICATIONS: None immediate. PROCEDURE: Informed written consent was obtained from the patient after a discussion of the risks, benefits, and alternatives to treatment. Questions regarding the procedure were encouraged and answered. The right neck and chest were prepped with chlorhexidine in a sterile fashion, and a sterile drape was applied covering the operative field. Maximum barrier sterile technique with sterile gowns and gloves were used for the procedure. A timeout was performed prior to the initiation of the procedure. After creating a small venotomy incision, a 21 gauge micropuncture kit was utilized to access the internal jugular vein. Real-time ultrasound guidance was utilized for vascular access including the acquisition of a permanent ultrasound image documenting patency of the accessed vessel. A Rosen wire was advanced to the level of the IVC and the  micropuncture sheath was exchanged for an 8 Fr dilator. A 14.5 French tunneled hemodialysis catheter measuring 19 cm from tip to cuff was tunneled in a retrograde fashion from the anterior chest wall to the venotomy incision. Serial dilation was then performed an a peel-away sheath was placed. The catheter was then placed through the peel-away sheath with the catheter tip ultimately positioned within the right atrium. Final catheter positioning was confirmed and documented with a spot radiographic image. The catheter aspirates and flushes normally. The catheter was flushed with appropriate volume heparin dwells. The catheter exit site was secured with a 0-Silk retention suture. The venotomy incision was closed with Dermabond. Sterile dressings were applied. The  patient tolerated the procedure well without immediate post procedural complication. IMPRESSION: Successful placement of 19 cm tip to cuff tunneled hemodialysis catheter via the right internal jugular vein with catheter tip terminating within the right atrium. The catheter is ready for immediate use. Ruthann Cancer, MD [facility] Radiology Electronically Signed   By: Ruthann Cancer M.D.   On: 08/15/2021 15:28    Scheduled Meds:  sodium chloride   Intravenous Once   atenolol  50 mg Oral Daily   atorvastatin  20 mg Oral q1800   calcitRIOL  0.25 mcg Oral Daily   Chlorhexidine Gluconate Cloth  6 each Topical Q0600   Chlorhexidine Gluconate Cloth  6 each Topical Q0600   heparin  5,000 Units Subcutaneous Q8H   hydrALAZINE  50 mg Oral Q8H   hydrocortisone cream   Topical BID   insulin aspart  0-20 Units Subcutaneous TID WC   insulin aspart  0-5 Units Subcutaneous QHS   insulin aspart  7 Units Subcutaneous TID WC   insulin glargine-yfgn  35 Units Subcutaneous Daily   nortriptyline  10 mg Oral BID   ondansetron (ZOFRAN) IV  4 mg Intravenous Q6H   pantoprazole (PROTONIX) IV  40 mg Intravenous Q12H   rOPINIRole  0.5 mg Oral QHS   sevelamer carbonate  800 mg Oral TID WC   Continuous Infusions:  calcium gluconate     [START ON 08/17/2021] ferric gluconate (FERRLECIT) IVPB       LOS: 2 days   Shelly Coss, MD [facility] P6/18/2023, 11:25 AM

## 2021-08-17 ENCOUNTER — Inpatient Hospital Stay (HOSPITAL_COMMUNITY): Payer: Medicare Other

## 2021-08-17 DIAGNOSIS — N184 Chronic kidney disease, stage 4 (severe): Secondary | ICD-10-CM | POA: Diagnosis not present

## 2021-08-17 DIAGNOSIS — N179 Acute kidney failure, unspecified: Secondary | ICD-10-CM | POA: Diagnosis not present

## 2021-08-17 LAB — CBC
HCT: 27.7 % — ABNORMAL LOW (ref 36.0–46.0)
Hemoglobin: 8.5 g/dL — ABNORMAL LOW (ref 12.0–15.0)
MCH: 24.1 pg — ABNORMAL LOW (ref 26.0–34.0)
MCHC: 30.7 g/dL (ref 30.0–36.0)
MCV: 78.7 fL — ABNORMAL LOW (ref 80.0–100.0)
Platelets: 242 10*3/uL (ref 150–400)
RBC: 3.52 MIL/uL — ABNORMAL LOW (ref 3.87–5.11)
RDW: 17.1 % — ABNORMAL HIGH (ref 11.5–15.5)
WBC: 10 10*3/uL (ref 4.0–10.5)
nRBC: 0 % (ref 0.0–0.2)

## 2021-08-17 LAB — BASIC METABOLIC PANEL
Anion gap: 19 — ABNORMAL HIGH (ref 5–15)
BUN: 69 mg/dL — ABNORMAL HIGH (ref 6–20)
CO2: 20 mmol/L — ABNORMAL LOW (ref 22–32)
Calcium: 4.9 mg/dL — CL (ref 8.9–10.3)
Chloride: 95 mmol/L — ABNORMAL LOW (ref 98–111)
Creatinine, Ser: 16.26 mg/dL — ABNORMAL HIGH (ref 0.44–1.00)
GFR, Estimated: 3 mL/min — ABNORMAL LOW (ref >60)
Glucose, Bld: 69 mg/dL — ABNORMAL LOW (ref 70–99)
Potassium: 3.6 mmol/L (ref 3.5–5.1)
Sodium: 134 mmol/L — ABNORMAL LOW (ref 135–145)

## 2021-08-17 LAB — GLUCOSE, CAPILLARY
Glucose-Capillary: 76 mg/dL (ref 70–99)
Glucose-Capillary: 132 mg/dL — ABNORMAL HIGH (ref 70–99)
Glucose-Capillary: 161 mg/dL — ABNORMAL HIGH (ref 70–99)
Glucose-Capillary: 103 mg/dL — ABNORMAL HIGH (ref 70–99)

## 2021-08-17 LAB — PARATHYROID HORMONE, INTACT (NO CA): PTH: 382 pg/mL — ABNORMAL HIGH (ref 15–65)

## 2021-08-17 MED ORDER — CALCIUM GLUCONATE-NACL 1-0.675 GM/50ML-% IV SOLN
1.0000 g | Freq: Once | INTRAVENOUS | Status: AC
  Administered 2021-08-17: 1000 mg via INTRAVENOUS
  Filled 2021-08-17: qty 50

## 2021-08-17 MED ORDER — HEPARIN SODIUM (PORCINE) 1000 UNIT/ML IJ SOLN
INTRAMUSCULAR | Status: AC
  Filled 2021-08-17: qty 4

## 2021-08-17 MED ORDER — CALCITRIOL 0.5 MCG PO CAPS
0.5000 ug | ORAL_CAPSULE | Freq: Every day | ORAL | Status: DC
  Administered 2021-08-18 – 2021-08-20 (×2): 0.5 ug via ORAL
  Filled 2021-08-17 (×2): qty 1

## 2021-08-17 MED ORDER — SODIUM CHLORIDE 0.9% FLUSH
10.0000 mL | Freq: Two times a day (BID) | INTRAVENOUS | Status: DC
  Administered 2021-08-17 – 2021-08-18 (×4): 10 mL

## 2021-08-17 MED ORDER — SODIUM CHLORIDE 0.9% FLUSH
10.0000 mL | INTRAVENOUS | Status: DC | PRN
  Administered 2021-08-19: 10 mL

## 2021-08-17 MED ORDER — CALCIUM ACETATE (PHOS BINDER) 667 MG PO CAPS
1334.0000 mg | ORAL_CAPSULE | Freq: Three times a day (TID) | ORAL | Status: DC
  Administered 2021-08-17 – 2021-08-20 (×7): 1334 mg via ORAL
  Filled 2021-08-17 (×7): qty 2

## 2021-08-17 MED ORDER — TUBERCULIN PPD 5 UNIT/0.1ML ID SOLN
5.0000 [IU] | Freq: Once | INTRADERMAL | Status: AC
  Administered 2021-08-18: 5 [IU] via INTRADERMAL
  Filled 2021-08-17: qty 0.1

## 2021-08-17 NOTE — Progress Notes (Signed)
PROGRESS NOTE  Sally Porter  JYN:[id number] DOB: 08-19-1980 DOA: 08/14/2021 PCP: Bridget Hartshorn, NP   Brief Narrative:  Patient is a 41 year old female with history of hypertension, diabetes type 2, hyperlipidemia, CKD stage IV, restless syndrome who presented to [facility] with complaints of nausea, vomiting, anorexia.  She was on dialysis in 2019 while in hospital then for only 2 weeks as an outpatient, she did not follow-up with nephrology after that because of noncompliance.  She has not seen a physician for the last 2 years.  [facility].  Blood work showed creatinine of 20.8.  CT abdomen chest was negative for any acute findings no hydronephrosis.  Case was discussed with nephrology and patient was transferred to [facility] for dialysis. Underwent dialysis catheter placement ,initiated dialysis.  Nephrology following.  Now waiting for outpatient dialysis arrangement.  Assessment & Plan:  Principal Problem:   Acute renal failure superimposed on stage 4 chronic kidney disease (HCC) Active Problems:   Mixed hyperlipidemia   Essential hypertension, benign   Intractable nausea and vomiting   Obesity, Class III, BMI 40-49.9 (morbid obesity) ([place])   Opioid dependence ([place])   Uncontrolled type 2 diabetes mellitus with hyperglycemia, with long-term current use of insulin (HCC)    AKI on CKD stage IV: Presented with nausea, vomiting, anorexia.  She was on dialysis in 2019 while in hospital then for only 2 weeks as an outpatient, she did not follow-up with nephrology after that because of noncompliance.  Lab work on presentation showed creatinine of 20.  She was frankly volume overloaded and uremic.  She was transferred to [facility] for initiation on dialysis. Dialysis started after placement of hemodialysis catheter.  Vascular surgery also following for placement of permanent vascular access for dialysis.  Continue phosphate binders  Severe  hypocalcemia: Most likely secondary to CKD/ESRD/renal osteodystrophy.  Supplemented today.  Continue to monitor  Anion gap metabolic acidosis: This is secondary to kidney disease.  Continue to monitor.  Might need to be started on sodium bicarb  Acute hypoxic respiratory failure:   This is secondary to volume overload.  Now on room air  Normocytic anemia: Likely associated  with CKD.  Hemoglobin dropped to the range of 6 .  No evidence of acute blood loss.  Given a unit of blood transfusion.  Continue to monitor CBC.Now stable at 8 She also microcytosis.  Anemia panel showed low iron, started on IV iron  Uncontrolled diabetes type 2 with hyperglycemia: Continues to be hyperglycemic.  Uses insulin at home.  Continue current insulin regimen.  Monitor blood sugars.  Hemoglobin A1c of 10.  Consulted diabetic coordinator  Hypertension: Blood pressure currently stable.  Continue atenolol, hydralazine  Hyperlipidemia: Continue statin  Intractable nausea/vomiting: Most likely secondary to uremic symptoms, could be complaint of diabetic gastroparesis as well.  Continue supportive care.  Resolved  Morbid obesity: BMI 41.4  Opiate dependence: Takes Percocet at home.  Also receives alprazolam.  Started nortriptyline.  Bilateral lower extremity edema: She states this is chronic and secondary to multiple surgeries on her bilateral lower extremities.  Part of it could be a component of volume overload          DVT prophylaxis:heparin injection 5,000 Units Start: 08/14/21 1415     Code Status: Full Code  Family Communication: None at bedside  Patient status:Inpatient  Patient is from :Home  Anticipated discharge QM:VHQI  Estimated DC date: After clearance from nephrology, outpatient dialysis arrangement   Consultants: Neprhology  Procedures: Dialysis catheter placement, dialysis  Antimicrobials:  Anti-infectives (From admission, onward)    Start     Dose/Rate Route Frequency  Ordered Stop   08/15/21 1111  ceFAZolin (ANCEF) IVPB 2g/100 mL premix        over 30 Minutes Intravenous Continuous PRN 08/15/21 1111 08/15/21 1105       Subjective: Patient seen and examined at the bedside this morning.  Hemodynamically stable.  Comfortable.  On room air today.  Objective: Vitals:   08/16/21 1622 08/16/21 2103 08/17/21 0626 08/17/21 0941  BP: (!) 151/95 (!) 141/76 117/64 130/74  Pulse: 78 78 73 79  Resp: _0 Temp: 98.8 F (37.1 C) 98.3 F (36.8 C) 97.6 F (36.4 C) 99 F (37.2 C)  TempSrc: Oral   Oral  SpO2: 100% 100% 100% 98%  Weight:      Height:        Intake/Output Summary (Last 24 hours) at 08/17/2021 1114 Last data filed at 08/17/2021 0600 Gross per 24 hour  Intake 1130 ml  Output 275 ml  Net 855 ml   Filed Weights   08/14/21 1132 08/15/21 0525 08/15/21 1617  Weight: 123.7 kg 124.9 kg 126.5 kg    Examination:  General exam: Overall comfortable, not in distress, morbidly obese HEENT: PERRL Respiratory system:  no wheezes or crackles  Cardiovascular system: S1 & S2 heard, RRR.  Dialysis catheter on the right chest Gastrointestinal system: Abdomen is nondistended, soft and nontender. Central nervous system: Alert and oriented Extremities: No edema, no clubbing ,no cyanosis Skin: No rashes, no ulcers,no icterus     Data Reviewed: I have personally reviewed following labs and imaging studies  CBC: Recent Labs  Lab 08/14/21 1331 08/15/21 0316 08/16/21 0303 08/17/21 0420  WBC 11.6* 9.4 10.8* 10.0  HGB 7.8* 6.9* 8.3* 8.5*  HCT 25.5* 23.0* 26.3* 27.7*  MCV 76.8* 77.4* 77.1* 78.7*  PLT 250 232 236 160   Basic Metabolic Panel: Recent Labs  Lab 08/14/21 1331 08/15/21 0316 08/15/21 1600 08/16/21 0303 08/17/21 0420  NA 134* 134*  --  131* 134*  K 3.7 4.1  --  3.7 3.6  CL 94* 96*  --  94* 95*  CO2 18* 19*  --  21* 20*  GLUCOSE 333* 315*  --  217* 69*  BUN 94* 98*  --  63* 69*  CREATININE 19.37* 19.70*  --  14.85* 16.26*   CALCIUM 4.2* 4.3*  --  5.2* 4.9*  PHOS 8.4* 9.2* 6.6*  --   --      Recent Results (from the past 240 hour(s))  MRSA Next Gen by PCR, Nasal     Status: None   Collection Time: 08/14/21 11:47 AM   Specimen: Nasal Mucosa; Nasal Swab  Result Value Ref Range Status   MRSA by PCR Next Gen NOT DETECTED NOT DETECTED Final    Comment: (NOTE) The GeneXpert MRSA Assay (FDA approved for NASAL specimens only), is one component of a comprehensive MRSA colonization surveillance program. It is not intended to diagnose MRSA infection nor to guide or monitor treatment for MRSA infections. Test performance is not FDA approved in patients less than 50 years old. Performed at [facility], [address] [place], [place] [address]      Radiology Studies: IR Fluoro Guide CV Line Right  Result Date: 08/15/2021 INDICATION: 41 year old female with end-stage renal disease requiring central venous access for hemodialysis. EXAM: TUNNELED CENTRAL VENOUS HEMODIALYSIS CATHETER PLACEMENT WITH ULTRASOUND AND FLUOROSCOPIC GUIDANCE MEDICATIONS:  Ancef 2 gm IV . The antibiotic was given in an appropriate time interval prior to skin puncture. ANESTHESIA/SEDATION: Moderate (conscious) sedation was employed during this procedure. A total of Versed 2 mg and Fentanyl 100 mcg was administered intravenously. Moderate Sedation Time: 11 minutes. The patient's level of consciousness and vital signs were monitored continuously by radiology nursing throughout the procedure under my direct supervision. FLUOROSCOPY TIME:  0 minutes 6 seconds (1 mGy). COMPLICATIONS: None immediate. PROCEDURE: Informed written consent was obtained from the patient after a discussion of the risks, benefits, and alternatives to treatment. Questions regarding the procedure were encouraged and answered. The right neck and chest were prepped with chlorhexidine in a sterile fashion, and a sterile drape was applied covering the operative field. Maximum barrier  sterile technique with sterile gowns and gloves were used for the procedure. A timeout was performed prior to the initiation of the procedure. After creating a small venotomy incision, a 21 gauge micropuncture kit was utilized to access the internal jugular vein. Real-time ultrasound guidance was utilized for vascular access including the acquisition of a permanent ultrasound image documenting patency of the accessed vessel. A Rosen wire was advanced to the level of the IVC and the micropuncture sheath was exchanged for an 8 Fr dilator. A 14.5 French tunneled hemodialysis catheter measuring 19 cm from tip to cuff was tunneled in a retrograde fashion from the anterior chest wall to the venotomy incision. Serial dilation was then performed an a peel-away sheath was placed. The catheter was then placed through the peel-away sheath with the catheter tip ultimately positioned within the right atrium. Final catheter positioning was confirmed and documented with a spot radiographic image. The catheter aspirates and flushes normally. The catheter was flushed with appropriate volume heparin dwells. The catheter exit site was secured with a 0-Silk retention suture. The venotomy incision was closed with Dermabond. Sterile dressings were applied. The patient tolerated the procedure well without immediate post procedural complication. IMPRESSION: Successful placement of 19 cm tip to cuff tunneled hemodialysis catheter via the right internal jugular vein with catheter tip terminating within the right atrium. The catheter is ready for immediate use. Ruthann Cancer, MD [facility] Electronically Signed   By: Ruthann Cancer M.D.   On: 08/15/2021 15:28   IR US Guide Vasc Access Right  Result Date: 08/15/2021 INDICATION: 41 year old female with end-stage renal disease requiring central venous access for hemodialysis. EXAM: TUNNELED CENTRAL VENOUS HEMODIALYSIS CATHETER  PLACEMENT WITH ULTRASOUND AND FLUOROSCOPIC GUIDANCE MEDICATIONS: Ancef 2 gm IV . The antibiotic was given in an appropriate time interval prior to skin puncture. ANESTHESIA/SEDATION: Moderate (conscious) sedation was employed during this procedure. A total of Versed 2 mg and Fentanyl 100 mcg was administered intravenously. Moderate Sedation Time: 11 minutes. The patient's level of consciousness and vital signs were monitored continuously by radiology nursing throughout the procedure under my direct supervision. FLUOROSCOPY TIME:  0 minutes 6 seconds (1 mGy). COMPLICATIONS: None immediate. PROCEDURE: Informed written consent was obtained from the patient after a discussion of the risks, benefits, and alternatives to treatment. Questions regarding the procedure were encouraged and answered. The right neck and chest were prepped with chlorhexidine in a sterile fashion, and a sterile drape was applied covering the operative field. Maximum barrier sterile technique with sterile gowns and gloves were used for the procedure. A timeout was performed prior to the initiation of the procedure. After creating a small venotomy incision, a 21 gauge micropuncture kit was utilized to  access the internal jugular vein. Real-time ultrasound guidance was utilized for vascular access including the acquisition of a permanent ultrasound image documenting patency of the accessed vessel. A Rosen wire was advanced to the level of the IVC and the micropuncture sheath was exchanged for an 8 Fr dilator. A 14.5 French tunneled hemodialysis catheter measuring 19 cm from tip to cuff was tunneled in a retrograde fashion from the anterior chest wall to the venotomy incision. Serial dilation was then performed an a peel-away sheath was placed. The catheter was then placed through the peel-away sheath with the catheter tip ultimately positioned within the right atrium. Final catheter positioning was confirmed and documented with a spot radiographic  image. The catheter aspirates and flushes normally. The catheter was flushed with appropriate volume heparin dwells. The catheter exit site was secured with a 0-Silk retention suture. The venotomy incision was closed with Dermabond. Sterile dressings were applied. The patient tolerated the procedure well without immediate post procedural complication. IMPRESSION: Successful placement of 19 cm tip to cuff tunneled hemodialysis catheter via the right internal jugular vein with catheter tip terminating within the right atrium. The catheter is ready for immediate use. Ruthann Cancer, MD [facility] Electronically Signed   By: Ruthann Cancer M.D.   On: 08/15/2021 15:28    Scheduled Meds:  sodium chloride   Intravenous Once   atenolol  50 mg Oral Daily   atorvastatin  20 mg Oral q1800   calcitRIOL  0.25 mcg Oral Daily   Chlorhexidine Gluconate Cloth  6 each Topical Q0600   Chlorhexidine Gluconate Cloth  6 each Topical Q0600   heparin  5,000 Units Subcutaneous Q8H   hydrALAZINE  50 mg Oral Q8H   hydrocortisone cream   Topical BID   insulin aspart  0-20 Units Subcutaneous TID WC   insulin aspart  0-5 Units Subcutaneous QHS   insulin aspart  7 Units Subcutaneous TID WC   insulin glargine-yfgn  35 Units Subcutaneous Daily   nortriptyline  10 mg Oral BID   ondansetron (ZOFRAN) IV  4 mg Intravenous Q6H   pantoprazole (PROTONIX) IV  40 mg Intravenous Q12H   polyethylene glycol  17 g Oral Daily   rOPINIRole  0.5 mg Oral QHS   sevelamer carbonate  800 mg Oral TID WC   sodium chloride flush  10-40 mL Intracatheter Q12H   Continuous Infusions:  ferric gluconate (FERRLECIT) IVPB       LOS: 3 days   Shelly Coss, MD [facility] P6/19/2023, 11:14 AM

## 2021-08-17 NOTE — Progress Notes (Signed)
IV team consult placed for new PIV. Patient had 18g PIV in her right [facility] that she reported significant pain around and down her arm. Assessed and noted 2+ edema + pain from her AC down to mid-forearm. Patient received calcium gluconate through this IV before she began to report pain. Concerned about infiltration as patient is reporting 10/10 pain down her arm. RN stopped maintenance fluids that were running at the time. New USGPIV placed in her left arm.   Spoke with pharmacy team Nicole Cella) who ordered hyalurinidase anecdote for infiltration. Primary team notified as well (pharmacy, primary RN and MD). Requested MD to assess site to determine if additional intervention is needed. If further assistance is needed, please place additional consult.   Hamna Asa Lorita Officer, RN

## 2021-08-17 NOTE — Plan of Care (Signed)

## 2021-08-17 NOTE — Progress Notes (Signed)
   08/17/21 1643  Provider Notification  Provider Name/Title Dr. Velia Meyer  Date Provider Notified 08/16/21  Time Provider Notified [phone]  Method of Notification Page  Notification Reason Critical result  Test performed and critical result Ca 4.9  Date Critical Result Received 08/16/21  Provider response Other (Comment) (Will defer to Nephrologist)  Date of Provider Response 08/17/21   Will continue to monitor patient.  Earleen Reaper RN

## 2021-08-17 NOTE — Progress Notes (Signed)
Attempted upper extremity vein mapping, however per RN, patient is going to HD. Will attempt again as schedule permits.  08/17/2021 2:46 PM Kelby Aline., MHA, RVT, RDCS, RDMS

## 2021-08-17 NOTE — Progress Notes (Signed)
New Dialysis Start   Patient identified as new dialysis start. Kidney Education packet assembled and given. Discussed the following items with patient:    Current medications and possible changes once started:  Discussed that patient's medications may change over time.  Ex; hypertension medications and diabetes medication.  Nephrologists will adjust as needed.  Fluid restrictions reviewed:  32 oz daily goal:  All liquids count; soups, ice, jello   Phosphorus and potassium: Handout given showing high potassium and phosphorus foods.  Alternative food and drink options given.  Family support:  no family present  Outpatient Clinic Resources:  Discussed roles of Outpatient clinic  staff and advised to make a list of needs, if any, to talk with outpatient staff if needed  Care plan schedule: Informed patient and family member of Care Plans in outpatient setting and to participate in the care plan.  An invitation would be given from outpatient clinic.   Dialysis Access Options:  Reviewed access options with patients. Discussed in detail about care at home with new AVG & AVF. Reviewed checking bruit and thrill. If dialysis catheter present, educated that patient could not take showers.  Catheter dressing changes were to be done by outpatient clinic staff only  Home therapy options:  Educated patient about home therapy options:  PD vs home hemo.  Patient stated she will think more about home therapies and follow up with her center.   Patient verbalized understanding. Will continue to round on patient during admission.    Lilia Argue, RN

## 2021-08-17 NOTE — Progress Notes (Signed)
[facility] Progress Note   Assessment/ Plan:   ESRD - newly diagnosed with creatinine already in the 4's when she was taken off dialysis in 2019, then lost to follow up. - Will check urine studies; no need for an u/s with CT already ruling out hydronephrosis. - Appreciate IR placing TC 6/17; tolerated HD#1 on 6/17 with 2.5L UF. No further nausea.  - HD #2 08/17/21   - Avoid nephrotoxic medications including NSAIDs and iodinated intravenous contrast exposure unless the latter is absolutely indicated.   - Preferred narcotic agents for pain control are hydromorphone, fentanyl, and methadone. Morphine should not be used.  - Avoid Baclofen and avoid oral sodium phosphate and magnesium citrate based laxatives / bowel preps.  - Continue strict Input and Output monitoring. Will monitor the patient closely with you and intervene or adjust therapy as indicated by changes in clinical status/labs   BMM: phos (9.2 -> 6.6) and also iPTH pending, 25vitD ordered; I suspect the phos will be elevated given the hypocalcemia.  - continue calcitriol, calcium gluconate, start calcium acetate as binder- but long-term once PTH comes down will need to transition to non-ca containing binder Anemia - iron panel TSAT 9%; will load with IV iron on dialysis and start ESA after partial load. S/p transfusion. Uncontrolled type 2 diabetes - on ISS per primary. Obesity HTN - once she starts dialysis lisinopril will be fine.    Subjective:    Seen in room.  For HD today.  [facility] has consulted, appreciate assistance- for acces on Wednesday.   Objective:   BP 130/74 (BP Location: Left Arm)   Pulse 79   Temp 99 F (37.2 C) (Oral)   Resp 18   Ht 5\' 8"  (1.727 m)   Wt 126.5 kg   SpO2 98%   BMI 42.40 kg/m   Physical Exam: Gen:NAD, lying in bed CVS: RRR Resp: clear Abd: soft Ext: 1+ LE edema ACCESS: [facility]  Labs: BMET Recent Labs  Lab 08/14/21 1331 08/15/21 0316 08/15/21 1600 08/16/21 0303  08/17/21 0420  NA 134* 134*  --  131* 134*  K 3.7 4.1  --  3.7 3.6  CL 94* 96*  --  94* 95*  CO2 18* 19*  --  21* 20*  GLUCOSE 333* 315*  --  217* 69*  BUN 94* 98*  --  63* 69*  CREATININE 19.37* 19.70*  --  14.85* 16.26*  CALCIUM 4.2* 4.3*  --  5.2* 4.9*  PHOS 8.4* 9.2* 6.6*  --   --    CBC Recent Labs  Lab 08/14/21 1331 08/15/21 0316 08/16/21 0303 08/17/21 0420  WBC 11.6* 9.4 10.8* 10.0  HGB 7.8* 6.9* 8.3* 8.5*  HCT 25.5* 23.0* 26.3* 27.7*  MCV 76.8* 77.4* 77.1* 78.7*  PLT 250 232 236 242      Medications:     sodium chloride   Intravenous Once   atenolol  50 mg Oral Daily   atorvastatin  20 mg Oral q1800   [START ON 08/18/2021] calcitRIOL  0.5 mcg Oral Daily   Chlorhexidine Gluconate Cloth  6 each Topical Q0600   Chlorhexidine Gluconate Cloth  6 each Topical Q0600   heparin  5,000 Units Subcutaneous Q8H   hydrALAZINE  50 mg Oral Q8H   hydrocortisone cream   Topical BID   insulin aspart  0-20 Units Subcutaneous TID WC   insulin aspart  0-5 Units Subcutaneous QHS   insulin aspart  7 Units Subcutaneous TID WC   insulin glargine-yfgn  35 Units Subcutaneous Daily   nortriptyline  10 mg Oral BID   ondansetron (ZOFRAN) IV  4 mg Intravenous Q6H   pantoprazole (PROTONIX) IV  40 mg Intravenous Q12H   polyethylene glycol  17 g Oral Daily   rOPINIRole  0.5 mg Oral QHS   sevelamer carbonate  800 mg Oral TID WC   sodium chloride flush  10-40 mL Intracatheter Q12H     Madelon Lips MD 08/17/2021, 1:15 PM

## 2021-08-17 NOTE — Progress Notes (Signed)
Requested to see pt for out-pt HD needs. Met with pt at bedside. Introduced self and explained role. Pt reports receiving HD in 2019 at [facility] and would like to return there if possible. Referral submitted to [facility] admissions this morning. Pt plans to have friends or family transport pt to out-pt HD appts at d/c. [facility] needing a chest x-ray or TB skin to complete admission process. Nephrologist made aware of need for one of the above. Will assist as needed.   Melven Sartorius Renal Navigator [phone]

## 2021-08-17 NOTE — Plan of Care (Signed)
Vein mapping pending. We will plan to create permanent access for her Wednesday, 08/19/2021.  Sally Porter. Stanford Breed, MD [facility] Phone Number: [phone] 08/17/2021 11:45 AM

## 2021-08-18 ENCOUNTER — Inpatient Hospital Stay (HOSPITAL_COMMUNITY): Payer: Medicare Other

## 2021-08-18 DIAGNOSIS — N185 Chronic kidney disease, stage 5: Secondary | ICD-10-CM

## 2021-08-18 DIAGNOSIS — N189 Chronic kidney disease, unspecified: Secondary | ICD-10-CM

## 2021-08-18 LAB — BASIC METABOLIC PANEL
Anion gap: 15 (ref 5–15)
BUN: 40 mg/dL — ABNORMAL HIGH (ref 6–20)
CO2: 22 mmol/L (ref 22–32)
Calcium: 5.6 mg/dL — CL (ref 8.9–10.3)
Chloride: 95 mmol/L — ABNORMAL LOW (ref 98–111)
Creatinine, Ser: 10.6 mg/dL — ABNORMAL HIGH (ref 0.44–1.00)
GFR, Estimated: 4 mL/min — ABNORMAL LOW (ref >60)
Glucose, Bld: 276 mg/dL — ABNORMAL HIGH (ref 70–99)
Potassium: 3.8 mmol/L (ref 3.5–5.1)
Sodium: 132 mmol/L — ABNORMAL LOW (ref 135–145)

## 2021-08-18 LAB — GLUCOSE, CAPILLARY
Glucose-Capillary: 212 mg/dL — ABNORMAL HIGH (ref 70–99)
Glucose-Capillary: 346 mg/dL — ABNORMAL HIGH (ref 70–99)
Glucose-Capillary: 255 mg/dL — ABNORMAL HIGH (ref 70–99)
Glucose-Capillary: 31 mg/dL — CL (ref 70–99)
Glucose-Capillary: 64 mg/dL — ABNORMAL LOW (ref 70–99)
Glucose-Capillary: 76 mg/dL (ref 70–99)
Glucose-Capillary: 172 mg/dL — ABNORMAL HIGH (ref 70–99)

## 2021-08-18 MED ORDER — BISACODYL 10 MG RE SUPP
10.0000 mg | Freq: Every day | RECTAL | Status: DC | PRN
  Administered 2021-08-18: 10 mg via RECTAL
  Filled 2021-08-18: qty 1

## 2021-08-18 MED ORDER — INSULIN ASPART 100 UNIT/ML IJ SOLN
7.0000 [IU] | Freq: Three times a day (TID) | INTRAMUSCULAR | Status: DC
  Administered 2021-08-18: 7 [IU] via SUBCUTANEOUS

## 2021-08-18 MED ORDER — RENA-VITE PO TABS
1.0000 | ORAL_TABLET | Freq: Every day | ORAL | Status: DC
  Administered 2021-08-18 – 2021-08-19 (×2): 1 via ORAL
  Filled 2021-08-18 (×2): qty 1

## 2021-08-18 NOTE — TOC Initial Note (Signed)
Transition of Care [facility]) - Initial/Assessment Note    Patient Details  Name: Sally Porter MRN: [id number] Date of Birth: [birth year]  Transition of Care [facility]) CM/SW Contact:    Tom-Johnson, Renea Ee, RN Phone Number: 08/18/2021, 4:50 PM  Clinical Narrative:                  CM consulted for PCP needs. Spoke with patient at bedside. Patient states she wants a PCP locally and will find one herself. CM notified patient that a hosp f/u appointment can be made with [facility] at discharge while waiting on her PCP appointment. Patient declined.  No other needs noted at this time. CM will continue to follow with needs.    Expected Discharge Plan: Home/Self Care Barriers to Discharge: Continued Medical Work up   Patient Goals and CMS Choice Patient states their goals for this hospitalization and ongoing recovery are:: To return home CMS Medicare.gov Compare Post Acute Care list provided to:: Patient Choice offered to / list presented to : NA  Expected Discharge Plan and Services Expected Discharge Plan: Home/Self Care In-house Referral: PCP / [facility] Discharge Planning Services: CM Consult, Follow-up appt scheduled Post Acute Care Choice: NA Living arrangements for the past 2 months: Single Family Home                 DME Arranged: N/A DME Agency: NA       HH Arranged: NA HH Agency: NA        Prior Living Arrangements/Services Living arrangements for the past 2 months: Single Family Home Lives with:: Significant Other, Minor Children Patient language and need for interpreter reviewed:: Yes Do you feel safe going back to the place where you live?: Yes      Need for Family Participation in Patient Care: Yes (Comment) Care giver support system in place?: Yes (comment)   Criminal Activity/Legal Involvement Pertinent to Current Situation/Hospitalization: No - Comment as needed  Activities of Daily Living Home Assistive Devices/Equipment: Eyeglasses, Wheelchair,  Environmental consultant (specify type) ADL Screening (condition at time of admission) Patient's cognitive ability adequate to safely complete daily activities?: Yes Is the patient deaf or have difficulty hearing?: No Does the patient have difficulty seeing, even when wearing glasses/contacts?: No Does the patient have difficulty concentrating, remembering, or making decisions?: No Patient able to express need for assistance with ADLs?: Yes Does the patient have difficulty dressing or bathing?: No Independently performs ADLs?: Yes (appropriate for developmental age) Does the patient have difficulty walking or climbing stairs?: No Weakness of Legs: None Weakness of Arms/Hands: None  Permission Sought/Granted Permission sought to share information with : Case Manager, Customer service manager, Family Supports Permission granted to share information with : Yes, Verbal Permission Granted              Emotional Assessment Appearance:: Appears stated age Attitude/Demeanor/Rapport: Engaged Affect (typically observed): Accepting, Appropriate, Calm, Hopeful Orientation: : Oriented to Self, Oriented to Place, Oriented to  Time, Oriented to Situation Alcohol / Substance Use: Not Applicable Psych Involvement: No (comment)  Admission diagnosis:  Acute on chronic renal failure ([place]) [N17.9, N18.9] Patient Active Problem List   Diagnosis Date Noted   Acute renal failure superimposed on stage 4 chronic kidney disease ([place]) 08/14/2021   Intractable nausea and vomiting 08/14/2021   Obesity, Class III, BMI 40-49.9 (morbid obesity) ([place]) 08/14/2021   Opioid dependence ([place]) 08/14/2021   Uncontrolled type 2 diabetes mellitus with hyperglycemia, with long-term current use of insulin ([place]) 08/14/2021   Type  1 diabetes mellitus with stage 5 chronic kidney disease not on chronic dialysis ([place]) 05/01/2019   Mixed hyperlipidemia 05/01/2019   Essential hypertension, benign 05/01/2019   Vitamin D deficiency  05/01/2019   Personal history of noncompliance with medical treatment, presenting hazards to health 05/01/2019   Hyperkalemia 05/01/2019   PCP:  Sally Hartshorn, NP Pharmacy:   [facility], [place] [address] [place] [address] Phone: [phone] Fax: [phone]  [facility] - [place], [place] [address] [place] [address] Phone: [phone] Fax: [phone]     Social Determinants of Health (SDOH) Interventions    Readmission Risk Interventions     No data to display

## 2021-08-18 NOTE — Progress Notes (Signed)
Bilateral upper extremity vein mapping study completed. Please see CV Proc for preliminary results.  Kristian Mogg BS, RVT 08/18/2021 10:05 AM

## 2021-08-18 NOTE — Anesthesia Preprocedure Evaluation (Signed)
Anesthesia Evaluation  Patient identified by MRN, date of birth, ID band Patient awake    Reviewed: Allergy & Precautions, NPO status , Patient's Chart, lab work & pertinent test results, reviewed documented beta blocker date and time   History of Anesthesia Complications Negative for: history of anesthetic complications  Airway Mallampati: II  TM Distance: >3 FB Neck ROM: Full    Dental  (+) Loose, Poor Dentition, Dental Advisory Given, Missing   Pulmonary COPD,  COPD inhaler,    breath sounds clear to auscultation       Cardiovascular hypertension, Pt. on medications and Pt. on home beta blockers (-) angina Rhythm:Regular Rate:Normal     Neuro/Psych glaucoma    GI/Hepatic Neg liver ROS, GERD  Controlled,  Endo/Other  diabetes (glu 363 (received insulin 7:30am)), Insulin Dependent  Renal/GU ESRFRenal disease (K+ 4.2)     Musculoskeletal   Abdominal (+) + obese,   Peds  Hematology  (+) Blood dyscrasia (Hb 8.5), anemia ,   Anesthesia Other Findings   Reproductive/Obstetrics LMP 6 weeks ago, last intercourse Feb                            Anesthesia Physical Anesthesia Plan  ASA: 3  Anesthesia Plan: Regional   Post-op Pain Management: Regional block* and Tylenol PO (pre-op)*   Induction:   PONV Risk Score and Plan: 2 and Ondansetron and Treatment may vary due to age or medical condition  Airway Management Planned: Natural Airway and Simple Face Mask  Additional Equipment: None  Intra-op Plan:   Post-operative Plan:   Informed Consent: I have reviewed the patients History and Physical, chart, labs and discussed the procedure including the risks, benefits and alternatives for the proposed anesthesia with the patient or authorized representative who has indicated his/her understanding and acceptance.     Dental advisory given  Plan Discussed with: CRNA and  Surgeon  Anesthesia Plan Comments: (Plan supraclavicular block)       Anesthesia Quick Evaluation

## 2021-08-18 NOTE — Care Management Important Message (Signed)
Important Message  Patient Details  Name: Sally Porter MRN: [id number] Date of Birth: Oct 03, 1980   Medicare Important Message Given:  Yes     Sally Porter Montine Circle 08/18/2021, 3:58 PM

## 2021-08-18 NOTE — Progress Notes (Signed)
Faxed MAR to [facility] admissions to show pt was given TB skin test early this morning. [facility] will not provide approval for out-pt HD until results of TB skin test are received. Results should be available Thursday am and will fax to admissions. Pt is being considered by [facility]. Will assist as needed.   Melven Sartorius Renal Navigator [phone]

## 2021-08-18 NOTE — Discharge Instructions (Signed)
[facility]  Discharge Instructions  AV Fistula or Graft Surgery for Dialysis Access  Please refer to the following instructions for your post-procedure care. Your surgeon or physician assistant will discuss any changes with you.  Activity  You may drive the day following your surgery, if you are comfortable and no longer taking prescription pain medication. Resume full activity as the soreness in your incision resolves.  Bathing/Showering  You may shower after you go home. Keep your incision dry for 48 hours. Do not soak in a bathtub, hot tub, or swim until the incision heals completely. You may not shower if you have a hemodialysis catheter.  Incision Care  Clean your incision with mild soap and water after 48 hours. Pat the area dry with a clean towel. You do not need a bandage unless otherwise instructed. Do not apply any ointments or creams to your incision. You may have skin glue on your incision. Do not peel it off. It will come off on its own in about one week. Your arm may swell a bit after surgery. To reduce swelling use pillows to elevate your arm so it is above your heart. Your doctor will tell you if you need to lightly wrap your arm with an ACE bandage.  Diet  Resume your normal diet. There are not special food restrictions following this procedure. In order to heal from your surgery, it is CRITICAL to get adequate nutrition. Your body requires vitamins, minerals, and protein. Vegetables are the best source of vitamins and minerals. Vegetables also provide the perfect balance of protein. Processed food has little nutritional value, so try to avoid this.  Medications  Resume taking all of your medications. If your incision is causing pain, you may take over-the counter pain relievers such as acetaminophen (Tylenol). If you were prescribed a stronger pain medication, please be aware these medications can cause nausea and constipation.  Prevent nausea by taking the medication with a snack or meal. Avoid constipation by drinking plenty of fluids and eating foods with high amount of fiber, such as fruits, vegetables, and grains.  Do not take Tylenol if you are taking prescription pain medications.  Follow up Your surgeon may want to see you in the office following your access surgery. If so, this will be arranged at the time of your surgery.  Please call us immediately for any of the following conditions:  Increased pain, redness, drainage (pus) from your incision site Fever of 101 degrees or higher Severe or worsening pain at your incision site Hand pain or numbness.  Reduce your risk of vascular disease:  Stop smoking. If you would like help, call [facility] at 1-800-QUIT-NOW ((408)339-5327) or [place] at 8018542001  Manage your cholesterol Maintain a desired weight Control your diabetes Keep your blood pressure down  Dialysis  It will take several weeks to several months for your new dialysis access to be ready for use. Your surgeon will determine when it is okay to use it. Your nephrologist will continue to direct your dialysis. You can continue to use your Permcath until your new access is ready for use.   08/19/2021 Janis Cleckley [id number] 1980-12-11  Surgeon(s): Leonie Douglas, MD  Procedure(s): Creation of Brachial Cephalic LEFT ARM ARTERIOVENOUS (AV) FISTULA   May stick graft immediately   May stick graft on designated area only:   x Do not stick fistula for 12 weeks    If you have any questions, please call  the office at [phone].

## 2021-08-18 NOTE — Progress Notes (Addendum)
  Progress Note    08/18/2021 7:57 AM * No surgery date entered *  Subjective:  no complaints   Vitals:   08/18/21 0208 08/18/21 0433  BP: (!) 162/69 (!) 154/66  Pulse: 89 87  Resp: 18 18  Temp: 99 F (37.2 C) 98.7 F (37.1 C)  SpO2: 98% 99%   Physical Exam: Lungs:  non labored Extremities:  moving all extremities well Abdomen:  soft Neurologic: A&O  CBC    Component Value Date/Time   WBC 10.0 08/17/2021 0420   RBC 3.52 (L) 08/17/2021 0420   HGB 8.5 (L) 08/17/2021 0420   HCT 27.7 (L) 08/17/2021 0420   PLT 242 08/17/2021 0420   MCV 78.7 (L) 08/17/2021 0420   MCH 24.1 (L) 08/17/2021 0420   MCHC 30.7 08/17/2021 0420   RDW 17.1 (H) 08/17/2021 0420   LYMPHSABS 3.9 11/02/2015 0815   MONOABS 0.4 11/02/2015 0815   EOSABS 0.4 11/02/2015 0815   BASOSABS 0.0 11/02/2015 0815    BMET    Component Value Date/Time   NA 132 (L) 08/18/2021 0346   K 3.8 08/18/2021 0346   CL 95 (L) 08/18/2021 0346   CO2 22 08/18/2021 0346   GLUCOSE 276 (H) 08/18/2021 0346   BUN 40 (H) 08/18/2021 0346   BUN 36 (A) 04/28/2019 0000   CREATININE 10.60 (H) 08/18/2021 0346   CREATININE 2.83 (H) 08/10/2018 1013   CALCIUM 5.6 (LL) 08/18/2021 0346   GFRNONAA 4 (L) 08/18/2021 0346   GFRNONAA 20 (L) 08/10/2018 1013   GFRAA 24 (L) 08/10/2018 1013    INR No results found for: "INR"   Intake/Output Summary (Last 24 hours) at 08/18/2021 0757 Last data filed at 08/18/2021 0600 Gross per 24 hour  Intake 1468.72 ml  Output 2050 ml  Net -581.28 ml     Assessment/Plan:  41 y.o. female with ESRD on HD  Tentative plan is for left arm AV fistula creation versus graft placement tomorrow 6/21 by Dr. Stanford Breed Case was discussed with the patient and all questions were answered Vein mapping is pending NPO past midnight   Dagoberto Ligas, PA-C [facility] [phone] 08/18/2021 7:57 AM   VASCULAR STAFF ADDENDUM: I have independently interviewed and examined the patient. I  agree with the above.   Yevonne Aline. Stanford Breed, MD [facility] Phone Number: [phone] 08/18/2021 4:11 PM

## 2021-08-18 NOTE — Progress Notes (Signed)
Nutrition Education Note  RD consulted for Renal Education.   Pt is a new start HD. S/p HD cath placement on 08/15/21.  Pt unavailable at time of visit. Attempted to speak with pt via call to hospital room phone, however, unable to reach.   RD provided "[place] for Healthy Eating with Kidney Disease". Attached to AVS/ discharge summary.   Body mass index is 42.14 kg/m. Pt meets criteria for obesity, class III based on current BMI. Obesity is a complex, chronic medical condition that is optimally managed by a multidisciplinary care team. Weight loss is not an ideal goal for an acute inpatient hospitalization. However, if further work-up for obesity is warranted, consider outpatient referral to outpatient bariatric service and/or [facility].    Current diet order is renal/ carb modified with 1.2 L fluid restriction, patient is consuming approximately 100% of meals at this time. Labs and medications reviewed. No further nutrition interventions warranted at this time. RD contact information provided. If additional nutrition issues arise, please re-consult RD.  Loistine Chance, RD, LDN, [place] Registered Dietitian II Certified Diabetes Care and Education Specialist Please refer to [facility] for RD and/or RD on-call/weekend/after hours pager

## 2021-08-18 NOTE — Progress Notes (Signed)
PROGRESS NOTE  Sally Porter  DDU:[id number] DOB: 26-Sep-1980 DOA: 08/14/2021 PCP: Bridget Hartshorn, NP   Brief Narrative:  Patient is a 41 year old female with history of hypertension, diabetes type 2, hyperlipidemia, CKD stage IV, restless syndrome who presented to [facility] with complaints of nausea, vomiting, anorexia.  She was on dialysis in 2019 while in hospital then for only 2 weeks as an outpatient, she did not follow-up with nephrology after that because of noncompliance.  She has not seen a physician for the last 2 years.  [facility].  Blood work showed creatinine of 20.8.  CT abdomen chest was negative for any acute findings no hydronephrosis.  Case was discussed with nephrology and patient was transferred to [facility] for dialysis. Underwent dialysis catheter placement ,initiated dialysis.  Nephrology following.  Now waiting for outpatient dialysis arrangement.  Plan for left arm AV fistula creation versus graft placement tomorrow by vascular surgery.  Assessment & Plan:  Principal Problem:   Acute renal failure superimposed on stage 4 chronic kidney disease (HCC) Active Problems:   Mixed hyperlipidemia   Essential hypertension, benign   Intractable nausea and vomiting   Obesity, Class III, BMI 40-49.9 (morbid obesity) ([place])   Opioid dependence ([place])   Uncontrolled type 2 diabetes mellitus with hyperglycemia, with long-term current use of insulin (HCC)    AKI on CKD stage IV: Presented with nausea, vomiting, anorexia.  She was on dialysis in 2019 while in hospital then for only 2 weeks as an outpatient, she did not follow-up with nephrology after that because of noncompliance.  Lab work on presentation showed creatinine of 20.  She was frankly volume overloaded and uremic.  She was transferred to [facility] for initiation on dialysis. Dialysis started after placement of hemodialysis catheter.  Vascular surgery also following for placement  of permanent vascular access for dialysis tomorrow.  Continue phosphate binders  Severe hypocalcemia: Most likely secondary to CKD/ESRD/renal osteodystrophy.  Continue to monitor  Acute hypoxic respiratory failure: resolved.  This is secondary to volume overload.  Now on room air  Normocytic anemia: Likely associated  with CKD.No evidence of acute blood loss.  Given a unit of blood transfusion during this hospitalization.  Continue to monitor CBC.Now stable at 8 She also microcytosis.  Anemia panel showed low iron, given IV iron  Uncontrolled diabetes type 2 with hyperglycemia: Continues to be hyperglycemic.  Uses insulin at home.  Continue current insulin regimen.  Monitor blood sugars.  Hemoglobin A1c of 10.  diabetic coordinator was following  Hypertension: Blood pressure currently stable.  Continue atenolol, hydralazine  Hyperlipidemia: Continue statin  Morbid obesity: BMI 41.4  Opiate dependence: Takes Percocet at home.  Also receives alprazolam.  Counseled for limiting  Bilateral lower extremity edema: She states this is chronic and secondary to multiple surgeries on her bilateral lower extremities.  Part of it could be a component of volume overload          DVT prophylaxis:heparin injection 5,000 Units Start: 08/14/21 1415     Code Status: Full Code  Family Communication: None at bedside  Patient status:Inpatient  Patient is from :Home  Anticipated discharge CB:[facility]SE[facility]  Estimated DC date: After clearance from nephrology, outpatient dialysis arrangement   Consultants: Neprhology  Procedures: Dialysis catheter placement, dialysis  Antimicrobials:  Anti-infectives (From admission, onward)    Start     Dose/Rate Route Frequency Ordered Stop   08/15/21 1111  ceFAZolin (ANCEF) IVPB 2g/100 mL premix  over 30 Minutes Intravenous Continuous PRN 08/15/21 1111 08/15/21 1105       Subjective: Patient seen and examined at the bedside this morning.   Hemodynamically stable.  Comfortable without any complaints.  On room air Objective: Vitals:   08/18/21 0049 08/18/21 0102 08/18/21 0208 08/18/21 0433  BP: (!) 136/101 (!) 148/61 (!) 162/69 (!) 154/66  Pulse: 92 91 89 87  Resp: 16 18 18 18   Temp: 98.8 F (37.1 C) 99 F (37.2 C) 99 F (37.2 C) 98.7 F (37.1 C)  TempSrc: Oral Oral Oral Oral  SpO2: 98% 99% 98% 99%  Weight:      Height:        Intake/Output Summary (Last 24 hours) at 08/18/2021 1149 Last data filed at 08/18/2021 0900 Gross per 24 hour  Intake 1348.72 ml  Output 2050 ml  Net -701.28 ml   Filed Weights   08/15/21 0525 08/15/21 1617 08/17/21 2030  Weight: 124.9 kg 126.5 kg 125.7 kg    Examination:  General exam: Overall comfortable, not in distress, morbidly obese HEENT: PERRL Respiratory system:  no wheezes or crackles  Cardiovascular system: S1 & S2 heard, RRR.  Dialysis catheter on right chest Gastrointestinal system: Abdomen is nondistended, soft and nontender. Central nervous system: Alert and oriented Extremities: Bilateral lower extremity edema, no clubbing ,no cyanosis Skin: Scattered rash, no ulcers   Data Reviewed: I have personally reviewed following labs and imaging studies  CBC: Recent Labs  Lab 08/14/21 1331 08/15/21 0316 08/16/21 0303 08/17/21 0420  WBC 11.6* 9.4 10.8* 10.0  HGB 7.8* 6.9* 8.3* 8.5*  HCT 25.5* 23.0* 26.3* 27.7*  MCV 76.8* 77.4* 77.1* 78.7*  PLT 250 232 236 322   Basic Metabolic Panel: Recent Labs  Lab 08/14/21 1331 08/15/21 0316 08/15/21 1600 08/16/21 0303 08/17/21 0420 08/18/21 0346  NA 134* 134*  --  131* 134* 132*  K 3.7 4.1  --  3.7 3.6 3.8  CL 94* 96*  --  94* 95* 95*  CO2 18* 19*  --  21* 20* 22  GLUCOSE 333* 315*  --  217* 69* 276*  BUN 94* 98*  --  63* 69* 40*  CREATININE 19.37* 19.70*  --  14.85* 16.26* 10.60*  CALCIUM 4.2* 4.3*  --  5.2* 4.9* 5.6*  PHOS 8.4* 9.2* 6.6*  --   --   --      Recent Results (from the past 240 hour(s))  MRSA Next  Gen by PCR, Nasal     Status: None   Collection Time: 08/14/21 11:47 AM   Specimen: Nasal Mucosa; Nasal Swab  Result Value Ref Range Status   MRSA by PCR Next Gen NOT DETECTED NOT DETECTED Final    Comment: (NOTE) The GeneXpert MRSA Assay (FDA approved for NASAL specimens only), is one component of a comprehensive MRSA colonization surveillance program. It is not intended to diagnose MRSA infection nor to guide or monitor treatment for MRSA infections. Test performance is not FDA approved in patients less than 90 years old. Performed at [facility], [address], [place], [place] [address]      Radiology Studies: VAS [place] UPPER EXT VEIN MAPPING (PRE-OP AVF)  Result Date: 08/18/2021 UPPER EXTREMITY VEIN MAPPING Patient Name:  TONIESHA ZELLNER  Date of Exam:   08/18/2021 Medical Rec #: [id number]         Accession #:    [id number] Date of Birth: May 11, 1980          Patient Gender: F Patient Age:  41 years Exam Location:  [facility] Procedure:      VAS [place] UPPER EXT VEIN MAPPING (PRE-OP AVF) Referring Phys: Jamelle Haring --------------------------------------------------------------------------------  Comparison Study: No previous exam noted. Performing Technologist: Bobetta Lime BS, RVT  Examination Guidelines: A complete evaluation includes B-mode imaging, spectral Doppler, color Doppler, and power Doppler as needed of all accessible portions of each vessel. Bilateral testing is considered an integral part of a complete examination. Limited examinations for reoccurring indications may be performed as noted. +--------------+-------------+----------+---------+ Right CephalicDiameter (cm)Depth (cm)Findings  +--------------+-------------+----------+---------+ Shoulder          0.26                         +--------------+-------------+----------+---------+ Prox upper arm    0.33                         +--------------+-------------+----------+---------+ Mid upper arm     0.35                branching +--------------+-------------+----------+---------+ Dist upper arm    0.37                         +--------------+-------------+----------+---------+ Prox forearm      0.28                         +--------------+-------------+----------+---------+ Mid forearm       0.26                         +--------------+-------------+----------+---------+ Wrist             0.24                         +--------------+-------------+----------+---------+ +-----------------+-------------+----------+--------+ Right Basilic    Diameter (cm)Depth (cm)Findings +-----------------+-------------+----------+--------+ Prox upper arm       0.31                        +-----------------+-------------+----------+--------+ Mid upper arm        0.32                        +-----------------+-------------+----------+--------+ Dist upper arm       0.42                        +-----------------+-------------+----------+--------+ Antecubital fossa    0.37                        +-----------------+-------------+----------+--------+ Prox forearm         0.36                        +-----------------+-------------+----------+--------+ The cephalic vein at the antecubital fossa is non-compressible. Superficial thrombophlebectomy of the right cephalic at the [facility] fossa. +-----------------+-------------+----------+---------+ Left Cephalic    Diameter (cm)Depth (cm)Findings  +-----------------+-------------+----------+---------+ Shoulder             0.55                         +-----------------+-------------+----------+---------+ Prox upper arm       0.57                         +-----------------+-------------+----------+---------+  Mid upper arm        0.54               branching +-----------------+-------------+----------+---------+ Dist upper arm       0.49               branching +-----------------+-------------+----------+---------+ Antecubital  fossa    0.58                         +-----------------+-------------+----------+---------+ Prox forearm         0.32                         +-----------------+-------------+----------+---------+ Mid forearm          0.21                         +-----------------+-------------+----------+---------+ Dist forearm         0.17                         +-----------------+-------------+----------+---------+ Wrist                0.15                         +-----------------+-------------+----------+---------+ +-----------------+-------------+----------+--------+ Left Basilic     Diameter (cm)Depth (cm)Findings +-----------------+-------------+----------+--------+ Prox upper arm       0.43                        +-----------------+-------------+----------+--------+ Mid upper arm        0.31                        +-----------------+-------------+----------+--------+ Dist upper arm       0.45                        +-----------------+-------------+----------+--------+ Antecubital fossa    0.24                        +-----------------+-------------+----------+--------+ Prox forearm         0.17                        +-----------------+-------------+----------+--------+ Summary: Right: Superficial thrombophlebitis of the right cephalic vein at        the [facility] fossa. All other segments are patent. Widely patent        right basilic vein. See above chart for vein measurements. Left: Widely patent superficial veins of the left upper extremity.       See above chart for vein measurements. *See table(s) above for measurements and observations.  Diagnosing physician: Deitra Mayo MD Electronically signed by Deitra Mayo MD on 08/18/2021 at 10:33:56 AM.    Final     Scheduled Meds:  sodium chloride   Intravenous Once   atenolol  50 mg Oral Daily   atorvastatin  20 mg Oral q1800   calcitRIOL  0.5 mcg Oral Daily   calcium acetate  1,334 mg Oral TID WC    Chlorhexidine Gluconate Cloth  6 each Topical Q0600   Chlorhexidine Gluconate Cloth  6 each Topical Q0600   heparin  5,000 Units Subcutaneous Q8H   hydrALAZINE  50 mg Oral Q8H   hydrocortisone cream   Topical BID  insulin aspart  0-20 Units Subcutaneous TID WC   insulin aspart  0-5 Units Subcutaneous QHS   insulin aspart  7 Units Subcutaneous TID WC   insulin glargine-yfgn  35 Units Subcutaneous Daily   multivitamin  1 tablet Oral QHS   nortriptyline  10 mg Oral BID   ondansetron (ZOFRAN) IV  4 mg Intravenous Q6H   pantoprazole (PROTONIX) IV  40 mg Intravenous Q12H   polyethylene glycol  17 g Oral Daily   rOPINIRole  0.5 mg Oral QHS   sodium chloride flush  10-40 mL Intracatheter Q12H   tuberculin  5 Units Intradermal Once   Continuous Infusions:  ferric gluconate (FERRLECIT) IVPB 250 mg (08/17/21 2238)     LOS: 4 days   Shelly Coss, MD [facility] P6/20/2023, 11:49 AM

## 2021-08-18 NOTE — Progress Notes (Signed)
[facility] Progress Note   Assessment/ Plan:   ESRD - newly diagnosed with creatinine already in the 4's when she was taken off dialysis in 2019, then lost to follow up. - Will check urine studies; no need for an u/s with CT already ruling out hydronephrosis. - Appreciate IR placing TC 6/17; tolerated HD#1 on 6/17 with 2.5L UF. No further nausea.  - HD #2 08/17/21 - HD #3 08/19/21--> will coordinate after OR   - Avoid nephrotoxic medications including NSAIDs and iodinated intravenous contrast exposure unless the latter is absolutely indicated.   - Preferred narcotic agents for pain control are hydromorphone, fentanyl, and methadone. Morphine should not be used.  - Avoid Baclofen and avoid oral sodium phosphate and magnesium citrate based laxatives / bowel preps.  - Continue strict Input and Output monitoring. Will monitor the patient closely with you and intervene or adjust therapy as indicated by changes in clinical status/labs   BMM: phos (9.2 -> 6.6) and also iPTH pending, 25vitD ordered; I suspect the phos will be elevated given the hypocalcemia.  - continue calcitriol, calcium gluconate, start calcium acetate as binder- but long-term once PTH comes down will need to transition to non-ca containing binder Anemia - iron panel TSAT 9%; will load with IV iron on dialysis and start ESA after partial load. S/p transfusion. Uncontrolled type 2 diabetes - on ISS per primary. Obesity HTN  Subjective:    S/p HD yesterday, tolerated well.  For access placement 08/19/21.  Has some pain in the R AC fossa- has superficial thrombophlebitis there per vein mapping.  Had a cool compress.    Objective:   BP (!) 154/66 (BP Location: Left Arm)   Pulse 87   Temp 98.7 F (37.1 C) (Oral)   Resp 18   Ht 5\' 8"  (1.727 m)   Wt 125.7 kg   SpO2 99%   BMI 42.14 kg/m   Physical Exam: Gen:NAD, lying in bed CVS: RRR Resp: clear Abd: soft Ext: 1+ LE edema, R AC fossa some tenderness and  mild swelling but doesn't appear to have erythema/ cellulitic appearing features ACCESS: [facility]  Labs: BMET Recent Labs  Lab 08/14/21 1331 08/15/21 0316 08/15/21 1600 08/16/21 0303 08/17/21 0420 08/18/21 0346  NA 134* 134*  --  131* 134* 132*  K 3.7 4.1  --  3.7 3.6 3.8  CL 94* 96*  --  94* 95* 95*  CO2 18* 19*  --  21* 20* 22  GLUCOSE 333* 315*  --  217* 69* 276*  BUN 94* 98*  --  63* 69* 40*  CREATININE 19.37* 19.70*  --  14.85* 16.26* 10.60*  CALCIUM 4.2* 4.3*  --  5.2* 4.9* 5.6*  PHOS 8.4* 9.2* 6.6*  --   --   --    CBC Recent Labs  Lab 08/14/21 1331 08/15/21 0316 08/16/21 0303 08/17/21 0420  WBC 11.6* 9.4 10.8* 10.0  HGB 7.8* 6.9* 8.3* 8.5*  HCT 25.5* 23.0* 26.3* 27.7*  MCV 76.8* 77.4* 77.1* 78.7*  PLT 250 232 236 242      Medications:     sodium chloride   Intravenous Once   atenolol  50 mg Oral Daily   atorvastatin  20 mg Oral q1800   calcitRIOL  0.5 mcg Oral Daily   calcium acetate  1,334 mg Oral TID WC   Chlorhexidine Gluconate Cloth  6 each Topical Q0600   Chlorhexidine Gluconate Cloth  6 each Topical Q0600   heparin  5,000 Units Subcutaneous  Q8H   hydrALAZINE  50 mg Oral Q8H   hydrocortisone cream   Topical BID   insulin aspart  0-20 Units Subcutaneous TID WC   insulin aspart  0-5 Units Subcutaneous QHS   insulin aspart  7 Units Subcutaneous TID WC   insulin glargine-yfgn  35 Units Subcutaneous Daily   multivitamin  1 tablet Oral QHS   nortriptyline  10 mg Oral BID   ondansetron (ZOFRAN) IV  4 mg Intravenous Q6H   pantoprazole (PROTONIX) IV  40 mg Intravenous Q12H   polyethylene glycol  17 g Oral Daily   rOPINIRole  0.5 mg Oral QHS   sodium chloride flush  10-40 mL Intracatheter Q12H   tuberculin  5 Units Intradermal Once     Madelon Lips MD 08/18/2021, 2:39 PM

## 2021-08-18 NOTE — Progress Notes (Signed)
[facility] night cross cover note:   I was contacted by RN with request for suppository for this patient who is reportedly constipated, with most recent bowel movement occurring multiple days ago in spite of interval MiraLAX.  I subsequently placed order for prn Dulcolax suppository.     Babs Bertin, DO Hospitalist

## 2021-08-18 NOTE — Progress Notes (Signed)
Hypoglycemic Event  CBG: 31  Treatment: 4 oz juice/soda  Symptoms: Shaky  Follow-up CBG: Time:1709 CBG Result:64 provided another 4 oz of juice.  Possible Reasons for Event: Inadequate meal intake  Comments/MD notified: Adhikari  Rechecked again at 1742 CBG was 64.    Aundra Millet

## 2021-08-18 NOTE — Progress Notes (Signed)
Hd tx completed. Pt tolerated tx well. Hd cath w/out s/s of complications. Pt transported back to room. Aox4, no acute distress. Report given to liz dolan, rn. Total uf removed: 2055ml Medications give: Ferric Gluconate 250mg   Post hd vs: 148/61(86) 99.0 91 98% 20 Post hd weight: 124.1kg

## 2021-08-19 ENCOUNTER — Inpatient Hospital Stay (HOSPITAL_COMMUNITY): Payer: Medicare Other | Admitting: Certified Registered Nurse Anesthetist

## 2021-08-19 ENCOUNTER — Encounter (HOSPITAL_COMMUNITY)
Admission: AD | Disposition: A | Discharge status: Home / Self Care | Payer: Self-pay | Source: Other Acute Inpatient Hospital | Attending: Internal Medicine

## 2021-08-19 ENCOUNTER — Other Ambulatory Visit: Payer: Self-pay

## 2021-08-19 DIAGNOSIS — Z992 Dependence on renal dialysis: Secondary | ICD-10-CM

## 2021-08-19 DIAGNOSIS — N186 End stage renal disease: Secondary | ICD-10-CM

## 2021-08-19 DIAGNOSIS — E1122 Type 2 diabetes mellitus with diabetic chronic kidney disease: Secondary | ICD-10-CM

## 2021-08-19 DIAGNOSIS — Z794 Long term (current) use of insulin: Secondary | ICD-10-CM

## 2021-08-19 DIAGNOSIS — I120 Hypertensive chronic kidney disease with stage 5 chronic kidney disease or end stage renal disease: Secondary | ICD-10-CM

## 2021-08-19 LAB — POCT I-STAT EG7
Acid-Base Excess: 0 mmol/L (ref 0.0–2.0)
Bicarbonate: 24.9 mmol/L (ref 20.0–28.0)
Calcium, Ion: 0.73 mmol/L — CL (ref 1.15–1.40)
HCT: 25 % — ABNORMAL LOW (ref 36.0–46.0)
Hemoglobin: 8.5 g/dL — ABNORMAL LOW (ref 12.0–15.0)
O2 Saturation: 51 %
Potassium: 4.2 mmol/L (ref 3.5–5.1)
Sodium: 131 mmol/L — ABNORMAL LOW (ref 135–145)
TCO2: 26 mmol/L (ref 22–32)
pCO2, Ven: 39.5 mmHg — ABNORMAL LOW (ref 44–60)
pH, Ven: 7.408 (ref 7.25–7.43)
pO2, Ven: 27 mmHg — CL (ref 32–45)

## 2021-08-19 LAB — SURGICAL PCR SCREEN
MRSA, PCR: NEGATIVE
Staphylococcus aureus: NEGATIVE

## 2021-08-19 LAB — GLUCOSE, CAPILLARY
Glucose-Capillary: 355 mg/dL — ABNORMAL HIGH (ref 70–99)
Glucose-Capillary: 363 mg/dL — ABNORMAL HIGH (ref 70–99)
Glucose-Capillary: 294 mg/dL — ABNORMAL HIGH (ref 70–99)
Glucose-Capillary: 273 mg/dL — ABNORMAL HIGH (ref 70–99)
Glucose-Capillary: 277 mg/dL — ABNORMAL HIGH (ref 70–99)
Glucose-Capillary: 150 mg/dL — ABNORMAL HIGH (ref 70–99)
Glucose-Capillary: 151 mg/dL — ABNORMAL HIGH (ref 70–99)

## 2021-08-19 LAB — HCG, SERUM, QUALITATIVE: Preg, Serum: NEGATIVE

## 2021-08-19 LAB — PARATHYROID HORMONE, INTACT (NO CA): PTH: 409 pg/mL — ABNORMAL HIGH (ref 15–65)

## 2021-08-19 SURGERY — ARTERIOVENOUS (AV) FISTULA CREATION
Anesthesia: Regional | Site: Arm Lower | Laterality: Left

## 2021-08-19 MED ORDER — LIDOCAINE HCL (PF) 1 % IJ SOLN: 5.0000 mL | INTRAMUSCULAR | Status: DC | PRN

## 2021-08-19 MED ORDER — INSULIN GLARGINE-YFGN 100 UNIT/ML ~~LOC~~ SOLN
40.0000 [IU] | Freq: Every day | SUBCUTANEOUS | Status: DC
  Administered 2021-08-20: 40 [IU] via SUBCUTANEOUS
  Filled 2021-08-19: qty 0.4

## 2021-08-19 MED ORDER — POLYETHYLENE GLYCOL 3350 17 G PO PACK
17.0000 g | PACK | Freq: Two times a day (BID) | ORAL | Status: DC
  Administered 2021-08-19: 17 g via ORAL
  Filled 2021-08-19: qty 1

## 2021-08-19 MED ORDER — CEFAZOLIN SODIUM-DEXTROSE 2-3 GM-%(50ML) IV SOLR
INTRAVENOUS | Status: DC | PRN
  Administered 2021-08-19: 2 g via INTRAVENOUS

## 2021-08-19 MED ORDER — ONDANSETRON HCL 4 MG/2ML IJ SOLN
INTRAMUSCULAR | Status: DC | PRN
  Administered 2021-08-19: 4 mg via INTRAVENOUS

## 2021-08-19 MED ORDER — PHENYLEPHRINE 80 MCG/ML (10ML) SYRINGE FOR IV PUSH (FOR BLOOD PRESSURE SUPPORT)
PREFILLED_SYRINGE | INTRAVENOUS | Status: AC
Start: 2021-08-19 — End: ?
  Filled 2021-08-19: qty 10

## 2021-08-19 MED ORDER — INSULIN ASPART 100 UNIT/ML IJ SOLN
5.0000 [IU] | Freq: Three times a day (TID) | INTRAMUSCULAR | Status: DC
  Administered 2021-08-19 – 2021-08-20 (×2): 5 [IU] via SUBCUTANEOUS

## 2021-08-19 MED ORDER — 0.9 % SODIUM CHLORIDE (POUR BTL) OPTIME
TOPICAL | Status: DC | PRN
  Administered 2021-08-19: 1000 mL

## 2021-08-19 MED ORDER — HEPARIN 6000 UNIT IRRIGATION SOLUTION
Status: AC
  Filled 2021-08-19: qty 500

## 2021-08-19 MED ORDER — MIDAZOLAM HCL 2 MG/2ML IJ SOLN
INTRAMUSCULAR | Status: AC
  Filled 2021-08-19: qty 2

## 2021-08-19 MED ORDER — CEFAZOLIN SODIUM-DEXTROSE 2-4 GM/100ML-% IV SOLN: 2.0000 g | Freq: Once | INTRAVENOUS | Status: DC

## 2021-08-19 MED ORDER — PROPOFOL 500 MG/50ML IV EMUL
INTRAVENOUS | Status: DC | PRN
  Administered 2021-08-19: 100 ug/kg/min via INTRAVENOUS

## 2021-08-19 MED ORDER — MIDAZOLAM HCL 2 MG/2ML IJ SOLN
INTRAMUSCULAR | Status: DC | PRN
  Administered 2021-08-19: 2 mg via INTRAVENOUS

## 2021-08-19 MED ORDER — LIDOCAINE-EPINEPHRINE (PF) 1 %-1:200000 IJ SOLN
INTRAMUSCULAR | Status: AC
  Filled 2021-08-19: qty 30

## 2021-08-19 MED ORDER — FENTANYL CITRATE (PF) 250 MCG/5ML IJ SOLN
INTRAMUSCULAR | Status: DC | PRN
  Administered 2021-08-19: 100 ug via INTRAVENOUS

## 2021-08-19 MED ORDER — POLYETHYLENE GLYCOL 3350 17 G PO PACK
17.0000 g | PACK | Freq: Two times a day (BID) | ORAL | Status: DC
  Administered 2021-08-19: 17 g via ORAL
  Filled 2021-08-19 (×2): qty 1

## 2021-08-19 MED ORDER — HEPARIN 6000 UNIT IRRIGATION SOLUTION
Status: DC | PRN
  Administered 2021-08-19: 1

## 2021-08-19 MED ORDER — OXYCODONE-ACETAMINOPHEN 5-325 MG PO TABS
1.0000 | ORAL_TABLET | Freq: Four times a day (QID) | ORAL | Status: DC | PRN
  Administered 2021-08-19 – 2021-08-20 (×3): 1 via ORAL
  Filled 2021-08-19 (×4): qty 1

## 2021-08-19 MED ORDER — LIDOCAINE HCL (PF) 1 % IJ SOLN
INTRAMUSCULAR | Status: AC
  Filled 2021-08-19: qty 30

## 2021-08-19 MED ORDER — SENNOSIDES-DOCUSATE SODIUM 8.6-50 MG PO TABS
1.0000 | ORAL_TABLET | Freq: Two times a day (BID) | ORAL | Status: DC
  Administered 2021-08-19: 1 via ORAL
  Filled 2021-08-19 (×2): qty 1

## 2021-08-19 MED ORDER — SODIUM CHLORIDE 0.9 % IV SOLN: INTRAVENOUS | Status: DC | PRN

## 2021-08-19 MED ORDER — PHENYLEPHRINE 80 MCG/ML (10ML) SYRINGE FOR IV PUSH (FOR BLOOD PRESSURE SUPPORT)
PREFILLED_SYRINGE | INTRAVENOUS | Status: DC | PRN
  Administered 2021-08-19 (×3): 160 ug via INTRAVENOUS
  Administered 2021-08-19: 80 ug via INTRAVENOUS

## 2021-08-19 MED ORDER — HEPARIN SODIUM (PORCINE) 1000 UNIT/ML IJ SOLN
INTRAMUSCULAR | Status: AC
  Filled 2021-08-19: qty 4

## 2021-08-19 MED ORDER — PROPOFOL 10 MG/ML IV BOLUS
INTRAVENOUS | Status: DC | PRN
  Administered 2021-08-19: 50 mg via INTRAVENOUS

## 2021-08-19 MED ORDER — CEFAZOLIN SODIUM 1 G IJ SOLR
INTRAMUSCULAR | Status: AC
  Filled 2021-08-19: qty 20

## 2021-08-19 MED ORDER — LIDOCAINE-EPINEPHRINE (PF) 1.5 %-1:200000 IJ SOLN
INTRAMUSCULAR | Status: DC | PRN
  Administered 2021-08-19: 30 mL via PERINEURAL

## 2021-08-19 MED ORDER — EPHEDRINE SULFATE-NACL 50-0.9 MG/10ML-% IV SOSY
PREFILLED_SYRINGE | INTRAVENOUS | Status: DC | PRN
  Administered 2021-08-19: 10 mg via INTRAVENOUS

## 2021-08-19 MED ORDER — PENTAFLUOROPROP-TETRAFLUOROETH EX AERO: 1.0000 | INHALATION_SPRAY | CUTANEOUS | Status: DC | PRN

## 2021-08-19 MED ORDER — LIDOCAINE-PRILOCAINE 2.5-2.5 % EX CREA
1.0000 | TOPICAL_CREAM | CUTANEOUS | Status: DC | PRN
  Filled 2021-08-19: qty 5

## 2021-08-19 MED ORDER — FENTANYL CITRATE (PF) 250 MCG/5ML IJ SOLN
INTRAMUSCULAR | Status: AC
  Filled 2021-08-19: qty 5

## 2021-08-19 SURGICAL SUPPLY — 42 items
ARMBAND PINK RESTRICT EXTREMIT (MISCELLANEOUS) ×2 IMPLANT
BENZOIN TINCTURE PRP APPL 2/3 (GAUZE/BANDAGES/DRESSINGS) ×2 IMPLANT
BLADE SURG 10 STRL SS (BLADE) ×1 IMPLANT
CANISTER SUCT 3000ML PPV (MISCELLANEOUS) ×2 IMPLANT
CANNULA VESSEL 3MM 2 BLNT TIP (CANNULA) ×2 IMPLANT
CHLORAPREP W/TINT 26 (MISCELLANEOUS) ×2 IMPLANT
CLIP LIGATING EXTRA MED SLVR (CLIP) ×2 IMPLANT
CLIP LIGATING EXTRA SM BLUE (MISCELLANEOUS) ×2 IMPLANT
COVER PROBE W GEL 5X96 (DRAPES) IMPLANT
DERMABOND ADVANCED (GAUZE/BANDAGES/DRESSINGS) ×1
DERMABOND ADVANCED .7 DNX12 (GAUZE/BANDAGES/DRESSINGS) IMPLANT
ELECT REM PT RETURN 9FT ADLT (ELECTROSURGICAL) ×2
ELECTRODE REM PT RTRN 9FT ADLT (ELECTROSURGICAL) ×1 IMPLANT
GLOVE BIO SURGEON STRL SZ7 (GLOVE) ×1 IMPLANT
GLOVE BIOGEL PI IND STRL 7.0 (GLOVE) IMPLANT
GLOVE BIOGEL PI INDICATOR 7.0 (GLOVE) ×2
GLOVE SURG SS PI 6.5 STRL IVOR (GLOVE) ×2 IMPLANT
GLOVE SURG SS PI 8.0 STRL IVOR (GLOVE) ×2 IMPLANT
GOWN STRL REUS W/ TWL LRG LVL3 (GOWN DISPOSABLE) ×2 IMPLANT
GOWN STRL REUS W/ TWL XL LVL3 (GOWN DISPOSABLE) ×1 IMPLANT
GOWN STRL REUS W/TWL LRG LVL3 (GOWN DISPOSABLE) ×7
GOWN STRL REUS W/TWL XL LVL3 (GOWN DISPOSABLE) ×1
INSERT FOGARTY SM (MISCELLANEOUS) IMPLANT
KIT BASIN OR (CUSTOM PROCEDURE TRAY) ×2 IMPLANT
KIT TURNOVER KIT B (KITS) ×2 IMPLANT
LOOP VESSEL MINI RED (MISCELLANEOUS) ×1 IMPLANT
NDL 18GX1X1/2 (RX/OR ONLY) (NEEDLE) IMPLANT
NEEDLE 18GX1X1/2 (RX/OR ONLY) (NEEDLE) IMPLANT
NS IRRIG 1000ML POUR BTL (IV SOLUTION) ×2 IMPLANT
PACK CV ACCESS (CUSTOM PROCEDURE TRAY) ×2 IMPLANT
PAD ARMBOARD 7.5X6 YLW CONV (MISCELLANEOUS) ×4 IMPLANT
SLING ARM FOAM STRAP LRG (SOFTGOODS) IMPLANT
SLING ARM FOAM STRAP MED (SOFTGOODS) IMPLANT
STRIP CLOSURE SKIN 1/2X4 (GAUZE/BANDAGES/DRESSINGS) ×2 IMPLANT
SUT MNCRL AB 4-0 PS2 18 (SUTURE) ×2 IMPLANT
SUT PROLENE 6 0 BV (SUTURE) ×2 IMPLANT
SUT VIC AB 3-0 SH 27 (SUTURE) ×1
SUT VIC AB 3-0 SH 27X BRD (SUTURE) ×1 IMPLANT
SYR 3ML LL SCALE MARK (SYRINGE) IMPLANT
TOWEL GREEN STERILE (TOWEL DISPOSABLE) ×2 IMPLANT
UNDERPAD 30X36 HEAVY ABSORB (UNDERPADS AND DIAPERS) ×2 IMPLANT
WATER STERILE IRR 1000ML POUR (IV SOLUTION) ×2 IMPLANT

## 2021-08-19 NOTE — Anesthesia Procedure Notes (Signed)
Anesthesia Regional Block: Supraclavicular block   Pre-Anesthetic Checklist: , timeout performed,  Correct Patient, Correct Site, Correct Laterality,  Correct Procedure, Correct Position, site marked,  Risks and benefits discussed,  Surgical consent,  Pre-op evaluation,  At surgeon's request and post-op pain management  Laterality: Left and Upper  Prep: chloraprep       Needles:  Injection technique: Single-shot  Needle Type: Echogenic Needle     Needle Length: 9cm  Needle Gauge: 21     Additional Needles:   Procedures:,,,, ultrasound used (permanent image in chart),,    Narrative:  Start time: 08/19/2021 7:56 AM End time: 08/19/2021 8:02 AM Injection made incrementally with aspirations every 5 mL.  Performed by: Personally  Anesthesiologist: Annye Asa, MD  Additional Notes: Pt identified in Holding room.  Monitors applied. Working IV access confirmed. Sterile prep L clavicle and neck.  #21ga ECHOgenic Arrow block needle to supraclavicular brachial plexus with US guidance.  30cc 1.5% Lidocaine 1:200k epi injected incrementally after negative test dose.  Patient asymptomatic, VSS, no heme aspirated, tolerated well.   Jenita Seashore, MD

## 2021-08-19 NOTE — Transfer of Care (Signed)
Immediate Anesthesia Transfer of Care Note  Patient: Sally Porter  Procedure(s) Performed: Creation of Brachial Cephalic LEFT ARM ARTERIOVENOUS (AV) FISTULA (Left: Arm Lower)  Patient Location: PACU  Anesthesia Type:MAC combined with regional for post-op pain  Level of Consciousness: sedated  Airway & Oxygen Therapy: Patient Spontanous Breathing  Post-op Assessment: Report given to RN and Post -op Vital signs reviewed and stable  Post vital signs: Reviewed and stable  Last Vitals:  Vitals Value Taken Time  BP 114/50 08/19/21 1001  Temp    Pulse 86 08/19/21 1003  Resp 18 08/19/21 1003  SpO2 98 % 08/19/21 1003  Vitals shown include unvalidated device data.  Last Pain:  Vitals:   08/19/21 0559  TempSrc: Oral  PainSc:       Patients Stated Pain Goal: 0 (02/33/43 5686)  Complications: No notable events documented.

## 2021-08-19 NOTE — Procedures (Signed)
[facility] Progress Note   Assessment/ Plan:   ESRD - newly diagnosed with creatinine already in the 4's when she was taken off dialysis in 2019, then lost to follow up. - Will check urine studies; no need for an u/s with CT already ruling out hydronephrosis. - Appreciate IR placing TC 6/17; tolerated HD#1 on 6/17 with 2.5L UF. No further nausea.  - HD #2 08/17/21 - HD #3 08/19/21 - s/p AVF creation 08/19/21 - CLIP in progress   - Avoid nephrotoxic medications including NSAIDs and iodinated intravenous contrast exposure unless the latter is absolutely indicated.   - Preferred narcotic agents for pain control are hydromorphone, fentanyl, and methadone. Morphine should not be used.  - Avoid Baclofen and avoid oral sodium phosphate and magnesium citrate based laxatives / bowel preps.  - Continue strict Input and Output monitoring. Will monitor the patient closely with you and intervene or adjust therapy as indicated by changes in clinical status/labs   BMM: phos (9.2 -> 6.6) and also iPTH pending, 25vitD ordered; I suspect the phos will be elevated given the hypocalcemia.  - continue calcitriol, calcium gluconate, start calcium acetate as binder- but long-term once PTH comes down will need to transition to non-ca containing binder Anemia - iron panel TSAT 9%; will load with IV iron on dialysis and start ESA after partial load. S/p transfusion. Uncontrolled type 2 diabetes - on ISS per primary. Obesity HTN  Subjective:    Seen and examined on HD.  BFR 350 mL/ min via TDC, UF goal 2.5L.  s/p AVF creation this AM, reporting pain.   Objective:   BP 139/77   Pulse 81   Temp (!) 97.4 F (36.3 C) (Oral)   Resp 14   Ht 5\' 8"  (1.727 m)   Wt 122.1 kg   LMP 04/28/2021 (Within Months) Comment: PREGNANCY TEST DONE AND NEGATIVE  SpO2 99%   BMI 40.93 kg/m   Physical Exam: Gen:NAD, lying in bed CVS: RRR Resp: clear Abd: soft Ext: 1+ LE edema, R AC fossa some tenderness and  mild swelling but doesn't appear to have erythema/ cellulitic appearing features ACCESS: [facility], L AVF + T/B  Labs: BMET Recent Labs  Lab 08/14/21 1331 08/15/21 0316 08/15/21 1600 08/16/21 0303 08/17/21 0420 08/18/21 0346  NA 134* 134*  --  131* 134* 132*  K 3.7 4.1  --  3.7 3.6 3.8  CL 94* 96*  --  94* 95* 95*  CO2 18* 19*  --  21* 20* 22  GLUCOSE 333* 315*  --  217* 69* 276*  BUN 94* 98*  --  63* 69* 40*  CREATININE 19.37* 19.70*  --  14.85* 16.26* 10.60*  CALCIUM 4.2* 4.3*  --  5.2* 4.9* 5.6*  PHOS 8.4* 9.2* 6.6*  --   --   --    CBC Recent Labs  Lab 08/14/21 1331 08/15/21 0316 08/16/21 0303 08/17/21 0420  WBC 11.6* 9.4 10.8* 10.0  HGB 7.8* 6.9* 8.3* 8.5*  HCT 25.5* 23.0* 26.3* 27.7*  MCV 76.8* 77.4* 77.1* 78.7*  PLT 250 232 236 242      Medications:     sodium chloride   Intravenous Once   atenolol  50 mg Oral Daily   atorvastatin  20 mg Oral q1800   calcitRIOL  0.5 mcg Oral Daily   calcium acetate  1,334 mg Oral TID WC   Chlorhexidine Gluconate Cloth  6 each Topical Q0600   Chlorhexidine Gluconate Cloth  6 each Topical Q0600  heparin  5,000 Units Subcutaneous Q8H   hydrALAZINE  50 mg Oral Q8H   hydrocortisone cream   Topical BID   insulin aspart  0-20 Units Subcutaneous TID WC   insulin aspart  0-5 Units Subcutaneous QHS   insulin aspart  5 Units Subcutaneous TID WC   [START ON 08/20/2021] insulin glargine-yfgn  40 Units Subcutaneous Daily   multivitamin  1 tablet Oral QHS   nortriptyline  10 mg Oral BID   ondansetron (ZOFRAN) IV  4 mg Intravenous Q6H   pantoprazole (PROTONIX) IV  40 mg Intravenous Q12H   polyethylene glycol  17 g Oral Daily   rOPINIRole  0.5 mg Oral QHS   sodium chloride flush  10-40 mL Intracatheter Q12H   tuberculin  5 Units Intradermal Once     Madelon Lips MD 08/19/2021, 1:11 PM

## 2021-08-19 NOTE — Progress Notes (Signed)
[facility] OF [place] PROGRESS NOTE  ASSESSMENT / PLAN: Sally Porter is a 41 y.o. female with new diagnosis of ESRD. In need of dialysis access. Plan left brachiocephalic arteriovenous fistula in OR today.    SUBJECTIVE: No complaints. Ready for OR today.  OBJECTIVE: BP (!) 128/58   Pulse 84   Temp 98.5 F (36.9 C) (Oral)   Resp 18   Ht 5\' 8"  (1.727 m)   Wt 125.7 kg   SpO2 98%   BMI 42.14 kg/m   Intake/Output Summary (Last 24 hours) at 08/19/2021 0806 Last data filed at 08/18/2021 1700 Gross per 24 hour  Intake 840 ml  Output 0 ml  Net 840 ml    No distress Regular rate and rhythm Unlabored 2+ radial pulses     Latest Ref Rng & Units 08/17/2021    4:20 AM 08/16/2021    3:03 AM 08/15/2021    3:16 AM  CBC  WBC 4.0 - 10.5 K/uL 10.0  10.8  9.4   Hemoglobin 12.0 - 15.0 g/dL 8.5  8.3  6.9   Hematocrit 36.0 - 46.0 % 27.7  26.3  23.0   Platelets 150 - 400 K/uL 242  236  232         Latest Ref Rng & Units 08/18/2021    3:46 AM 08/17/2021    4:20 AM 08/16/2021    3:03 AM  CMP  Glucose 70 - 99 mg/dL 276  69  217   BUN 6 - 20 mg/dL 40  69  63   Creatinine 0.44 - 1.00 mg/dL 10.60  16.26  14.85   Sodium 135 - 145 mmol/L 132  134  131   Potassium 3.5 - 5.1 mmol/L 3.8  3.6  3.7   Chloride 98 - 111 mmol/L 95  95  94   CO2 22 - 32 mmol/L 22  20  21    Calcium 8.9 - 10.3 mg/dL 5.6  4.9  5.2     Estimated Creatinine Clearance: 9.9 mL/min (A) (by C-G formula based on SCr of 10.6 mg/dL (H)).  Yevonne Aline. Stanford Breed, MD [facility] of [facility] Phone Number: [phone] 08/19/2021 8:06 AM

## 2021-08-19 NOTE — Anesthesia Postprocedure Evaluation (Signed)
Anesthesia Post Note  Patient: Lorina Franta  Procedure(s) Performed: Creation of Brachial Cephalic LEFT ARM ARTERIOVENOUS (AV) FISTULA (Left: Arm Lower)     Patient location during evaluation: PACU Anesthesia Type: Regional Level of consciousness: awake and alert, patient cooperative and oriented Pain management: pain level controlled Vital Signs Assessment: post-procedure vital signs reviewed and stable Respiratory status: spontaneous breathing, nonlabored ventilation and respiratory function stable Cardiovascular status: blood pressure returned to baseline and stable Postop Assessment: no apparent nausea or vomiting Anesthetic complications: no   No notable events documented.  Last Vitals:  Vitals:   08/19/21 1016 08/19/21 1030  BP: (!) 91/44 (!) 91/44  Pulse: 81 78  Resp: 17 15  Temp:    SpO2: 95% 95%    Last Pain:  Vitals:   08/19/21 1030  TempSrc:   PainSc: 0-No pain                 Roosevelt Bisher,E. Ashawn Rinehart

## 2021-08-19 NOTE — Progress Notes (Signed)
Received patient in bed, alert and oriented. Informed consent signed and in chart. Complaints of 10/10 pain in newly created L-AVF. Ice pouch applied to help with pain. Advised medication will be reassessed once HD tx begins to ensure BP remains stable. She understood and agreed.   Time tx initiated: 1218  Pre HD weight: 122.1kg  Pre HD VS: 122/61  Resp: 15 Temp 97.4 02: 98% RA  Time tx completed: 7741  HD treatment completed. Patient tolerated well. Fistula/Graft/HD catheter without signs and symptoms of complications. Patient transported back to the room, alert and orient and in no acute distress. Report given to bedside RN.  Total UF removed: 2800L  Medication given: Ferric Gluconate 135 mL/hr. Oxycodone 5-325mg  tab  Post HD VS: 171/92 100% Resp: 12 98.1 T  Post HD weight: 123.7

## 2021-08-19 NOTE — Op Note (Signed)
DATE OF SERVICE: 08/19/2021  PATIENT:  Sally Porter  41 y.o. female  PRE-OPERATIVE DIAGNOSIS:  ESRD  POST-OPERATIVE DIAGNOSIS:  Same  PROCEDURE:   Left brachiocephalic arteriovenous fistula creation  SURGEON:  Surgeon(s) and Role:    * Cherre Robins, MD - Primary  ASSISTANT: Vicente Serene, PA-C  An experienced assistant was required given the complexity of this procedure and the standard of surgical care. My assistant helped with exposure through counter tension, suctioning, ligation and retraction to better visualize the surgical field.  My assistant expedited sewing during the case by following my sutures. Wherever I use the term "we" in the report, my assistant actively helped me with that portion of the procedure.  ANESTHESIA:   regional and MAC  EBL: minimal  BLOOD ADMINISTERED:none  DRAINS: none   LOCAL MEDICATIONS USED:  NONE  SPECIMEN:  none  COUNTS: confirmed correct.  TOURNIQUET:  none  PATIENT DISPOSITION:  PACU - hemodynamically stable.   Delay start of Pharmacological VTE agent (>24hrs) due to surgical blood loss or risk of bleeding: no  INDICATION FOR PROCEDURE: Sally Porter is a 41 y.o. female with new diagnosis of ESRD on HD. Now in need of permanent dialysis access. After careful discussion of risks, benefits, and alternatives the patient was offered AV fistula creation. . The patient understood and wished to proceed.  OPERATIVE FINDINGS: good sized vein. Small arteries. Good thrill at completion. Doppler flow noted at the left wrist.  DESCRIPTION OF PROCEDURE: After identification of the patient in the pre-operative holding area, the patient was transferred to the operating room. The patient was positioned supine on the operating room table. Anesthesia was induced. The left arm was prepped and draped in standard fashion. A surgical pause was performed confirming correct patient, procedure, and operative location.  Using intraoperative  ultrasound, the course of the left upper extremity superficial veins was mapped.  The cephalic vein appeared adequate for arteriovenous fistula creation.  The brachial artery was similarly mapped.  The artery appeared adequate for arterial venous creation. We ensured there was no anomalous arterial anatomy such as a high bifurcation.  A transverse incision was made in the left arm just distal to the antecubital crease.  Incision was carried down through subcutaneous tissue until the cephalic vein was identified and skeletonized.  We continued our exposure through the aponeurosis of the biceps.  The brachial artery was encountered its usual position.  The artery was circumferentially exposed and encircled with Silastic Vesseloops.  Patient was systemically heparinized.  The distal cephalic vein was transected.  The distal stump of the cephalic vein was oversewn with a 2-0 silk suture.  The proximal vein was controlled with a bulldog clamp.  The brachial artery was clamped proximally medially.  An anterior arteriotomy was made with a 11 blade.  The arteriotomy was extended with Potts scissors.  Using a parachute technique the cephalic vein was anastomosed to the brachial arteriotomy in end-to-side fashion with continuous running suture of 6-0 Prolene.  Immediately prior to completion the anastomosis was flushed and de-aired.  The anastomosis was completed.  Hemostasis was assured.  The fistula was interrogated with Doppler. Audible bruit was heard throughout the course of the cephalic vein.  A radial artery signal was heard which augmented slightly with compression of the fistula.  Upon completion of the case instrument and sharps counts were confirmed correct. The patient was transferred to the PACU in good condition. I was present for all portions of the procedure.  Yevonne Aline.  Stanford Breed, MD [facility] of [facility] Phone Number: [phone] 08/19/2021 10:02 AM

## 2021-08-19 NOTE — Progress Notes (Signed)
Patient expresses 10/10 pain in left arm after L-AF creation today.  BP 139/77 See MAR for meds administered.

## 2021-08-19 NOTE — Progress Notes (Signed)
Entered room to find on the table that pt had ordered [employer] to  be delivered to her room [employer] to eat tonight.Yesterday pt ordered Popeyes from [employer] with a large sweet Ice Tea which she ate and drank last night.explained to pt it will cause her sugar to elevate eating all those Carbs pt vebalized understanding.

## 2021-08-19 NOTE — Progress Notes (Signed)
PROGRESS NOTE  Sally Porter  NLG:[id number] DOB: 1980-10-18 DOA: 08/14/2021 PCP: Bridget Hartshorn, NP   Brief Narrative:  Patient is a 41 year old female with history of hypertension, diabetes type 2, hyperlipidemia, CKD stage IV, restless syndrome who presented to [facility] with complaints of nausea, vomiting, anorexia.  She was on dialysis in 2019 while in hospital then for only 2 weeks as an outpatient, she did not follow-up with nephrology after that because of noncompliance.  She has not seen a physician for the last 2 years.  [facility].  Blood work showed creatinine of 20.8.  CT abdomen chest was negative for any acute findings no hydronephrosis.  Case was discussed with nephrology and patient was transferred to [facility] for dialysis. Underwent dialysis catheter placement ,initiated dialysis.  Nephrology following.  Now waiting for outpatient dialysis arrangement.  Plan for left arm AV fistula creation versus graft placement today by vascular surgery.  Assessment & Plan:  Principal Problem:   Acute renal failure superimposed on stage 4 chronic kidney disease (HCC) Active Problems:   Mixed hyperlipidemia   Essential hypertension, benign   Intractable nausea and vomiting   Obesity, Class III, BMI 40-49.9 (morbid obesity) ([place])   Opioid dependence ([place])   Uncontrolled type 2 diabetes mellitus with hyperglycemia, with long-term current use of insulin (HCC)    AKI on CKD stage IV: Presented with nausea, vomiting, anorexia.  She was on dialysis in 2019 while in hospital then for only 2 weeks as an outpatient, she did not follow-up with nephrology after that because of noncompliance.  Lab work on presentation showed creatinine of 20.  She was frankly volume overloaded and uremic.  She was transferred to [facility] for initiation on dialysis. Dialysis started after placement of hemodialysis catheter.  Vascular surgery also following for placement of  permanent vascular access for dialysis today,  Continue phosphate binders  Severe hypocalcemia: Most likely secondary to CKD/ESRD/renal osteodystrophy.  Continue to monitor  Acute hypoxic respiratory failure: resolved.  This is secondary to volume overload.  Now on room air  Normocytic anemia: Likely associated  with CKD.No evidence of acute blood loss.  Given a unit of blood transfusion during this hospitalization.  Continue to monitor CBC.Now stable at 8 She also microcytosis.  Anemia panel showed low iron, given IV iron  Uncontrolled diabetes type 2 with hyperglycemia: Continues to be hyperglycemic.  Uses insulin at home.  Continue current insulin regimen.  Monitor blood sugars.  Hemoglobin A1c of 10.  Her sugars have been erratic.  She follows with endocrinology at [facility].  Diabetes coronary consulted.  Hypertension: Blood pressure currently stable.  Continue atenolol, hydralazine  Hyperlipidemia: Continue statin  Morbid obesity: BMI 41.4  Opiate dependence: Takes Percocet at home.  Also receives alprazolam.  Counseled for limiting  Bilateral lower extremity edema: She states this is chronic and secondary to multiple surgeries on her bilateral lower extremities.  Part of it could be a component of volume overload          DVT prophylaxis:heparin injection 5,000 Units Start: 08/14/21 1415     Code Status: Full Code  Family Communication: None at bedside  Patient status:Inpatient  Patient is from :Home  Anticipated discharge YC:XKGY  Estimated DC date: After clearance from nephrology, outpatient dialysis arrangement   Consultants: Neprhology  Procedures: Dialysis catheter placement, dialysis  Antimicrobials:  Anti-infectives (From admission, onward)    Start     Dose/Rate Route Frequency Ordered Stop   08/19/21  1200  ceFAZolin (ANCEF) IVPB 2g/100 mL premix        2 g 200 mL/hr over 30 Minutes Intravenous  Once 08/19/21 1107     08/15/21 1111  ceFAZolin  (ANCEF) IVPB 2g/100 mL premix        over 30 Minutes Intravenous Continuous PRN 08/15/21 1111 08/15/21 1105       Subjective:  Patient seen and examined at the bedside this morning.  She was about to go for AV fistula/graft formation by vascular surgery.  Sugars were running high this morning.  Not in any Distress  Objective: Vitals:   08/19/21 1016 08/19/21 1030 08/19/21 1045 08/19/21 1108  BP: (!) 91/44 (!) 91/44 (!) 90/47 (!) 95/47  Pulse: 81 78 78 77  Resp: 17 15 16 17   Temp:   97.9 F (36.6 C) 98.1 F (36.7 C)  TempSrc:    Oral  SpO2: 95% 95% 95% 95%  Weight:      Height:        Intake/Output Summary (Last 24 hours) at 08/19/2021 1141 Last data filed at 08/19/2021 1610 Gross per 24 hour  Intake 900 ml  Output --  Net 900 ml   Filed Weights   08/15/21 0525 08/15/21 1617 08/17/21 2030  Weight: 124.9 kg 126.5 kg 125.7 kg    Examination:  General exam: Overall comfortable, not in distress, morbidly obese HEENT: PERRL Respiratory system:  no wheezes or crackles  Cardiovascular system: S1 & S2 heard, RRR.  Dialysis catheter on the right chest Gastrointestinal system: Abdomen is nondistended, soft and nontender. Central nervous system: Alert and oriented Extremities: Trace bilateral lower extremity edema, no clubbing ,no cyanosis Skin: Scattered rash, no ulcers,no icterus     Data Reviewed: I have personally reviewed following labs and imaging studies  CBC: Recent Labs  Lab 08/14/21 1331 08/15/21 0316 08/16/21 0303 08/17/21 0420  WBC 11.6* 9.4 10.8* 10.0  HGB 7.8* 6.9* 8.3* 8.5*  HCT 25.5* 23.0* 26.3* 27.7*  MCV 76.8* 77.4* 77.1* 78.7*  PLT 250 232 236 960   Basic Metabolic Panel: Recent Labs  Lab 08/14/21 1331 08/15/21 0316 08/15/21 1600 08/16/21 0303 08/17/21 0420 08/18/21 0346  NA 134* 134*  --  131* 134* 132*  K 3.7 4.1  --  3.7 3.6 3.8  CL 94* 96*  --  94* 95* 95*  CO2 18* 19*  --  21* 20* 22  GLUCOSE 333* 315*  --  217* 69* 276*  BUN 94*  98*  --  63* 69* 40*  CREATININE 19.37* 19.70*  --  14.85* 16.26* 10.60*  CALCIUM 4.2* 4.3*  --  5.2* 4.9* 5.6*  PHOS 8.4* 9.2* 6.6*  --   --   --      Recent Results (from the past 240 hour(s))  MRSA Next Gen by PCR, Nasal     Status: None   Collection Time: 08/14/21 11:47 AM   Specimen: Nasal Mucosa; Nasal Swab  Result Value Ref Range Status   MRSA by PCR Next Gen NOT DETECTED NOT DETECTED Final    Comment: (NOTE) The GeneXpert MRSA Assay (FDA approved for NASAL specimens only), is one component of a comprehensive MRSA colonization surveillance program. It is not intended to diagnose MRSA infection nor to guide or monitor treatment for MRSA infections. Test performance is not FDA approved in patients less than 23 years old. Performed at [facility], [address] [place], [place] [address]   Surgical pcr screen     Status: None  Collection Time: 08/19/21  6:42 AM   Specimen: Nasal Mucosa; Nasal Swab  Result Value Ref Range Status   MRSA, PCR NEGATIVE NEGATIVE Final   Staphylococcus aureus NEGATIVE NEGATIVE Final    Comment: (NOTE) The Xpert SA Assay (FDA approved for NASAL specimens in patients 32 years of age and older), is one component of a comprehensive surveillance program. It is not intended to diagnose infection nor to guide or monitor treatment. Performed at [facility], [place] [address] [place], [place] [address]      Radiology Studies: VAS [place] UPPER EXT VEIN MAPPING (PRE-OP AVF)  Result Date: 08/18/2021 UPPER EXTREMITY VEIN MAPPING Patient Name:  AUBREANNA PERCLE  Date of Exam:   08/18/2021 Medical Rec #: [id number]         Accession #:    [id number] Date of Birth: 1980/09/18          Patient Gender: F Patient Age:   65 years Exam Location[facility]  [facility] Procedure:      VAS [place] UPPER EXT VEIN MAPPING (PRE-OP AVF) Referring Phys: Jamelle Haring --------------------------------------------------------------------------------  Comparison Study: No previous  exam noted. Performing Technologist: Bobetta Lime BS, RVT  Examination Guidelines: A complete evaluation includes B-mode imaging, spectral Doppler, color Doppler, and power Doppler as needed of all accessible portions of each vessel. Bilateral testing is considered an integral part of a complete examination. Limited examinations for reoccurring indications may be performed as noted. +--------------+-------------+----------+---------+ Right CephalicDiameter (cm)Depth (cm)Findings  +--------------+-------------+----------+---------+ Shoulder          0.26                         +--------------+-------------+----------+---------+ Prox upper arm    0.33                         +--------------+-------------+----------+---------+ Mid upper arm     0.35               branching +--------------+-------------+----------+---------+ Dist upper arm    0.37                         +--------------+-------------+----------+---------+ Prox forearm      0.28                         +--------------+-------------+----------+---------+ Mid forearm       0.26                         +--------------+-------------+----------+---------+ Wrist             0.24                         +--------------+-------------+----------+---------+ +-----------------+-------------+----------+--------+ Right Basilic    Diameter (cm)Depth (cm)Findings +-----------------+-------------+----------+--------+ Prox upper arm       0.31                        +-----------------+-------------+----------+--------+ Mid upper arm        0.32                        +-----------------+-------------+----------+--------+ Dist upper arm       0.42                        +-----------------+-------------+----------+--------+ Antecubital  fossa    0.37                        +-----------------+-------------+----------+--------+ Prox forearm         0.36                         +-----------------+-------------+----------+--------+ The cephalic vein at the antecubital fossa is non-compressible. Superficial thrombophlebectomy of the right cephalic at the [facility] fossa. +-----------------+-------------+----------+---------+ Left Cephalic    Diameter (cm)Depth (cm)Findings  +-----------------+-------------+----------+---------+ Shoulder             0.55                         +-----------------+-------------+----------+---------+ Prox upper arm       0.57                         +-----------------+-------------+----------+---------+ Mid upper arm        0.54               branching +-----------------+-------------+----------+---------+ Dist upper arm       0.49               branching +-----------------+-------------+----------+---------+ Antecubital fossa    0.58                         +-----------------+-------------+----------+---------+ Prox forearm         0.32                         +-----------------+-------------+----------+---------+ Mid forearm          0.21                         +-----------------+-------------+----------+---------+ Dist forearm         0.17                         +-----------------+-------------+----------+---------+ Wrist                0.15                         +-----------------+-------------+----------+---------+ +-----------------+-------------+----------+--------+ Left Basilic     Diameter (cm)Depth (cm)Findings +-----------------+-------------+----------+--------+ Prox upper arm       0.43                        +-----------------+-------------+----------+--------+ Mid upper arm        0.31                        +-----------------+-------------+----------+--------+ Dist upper arm       0.45                        +-----------------+-------------+----------+--------+ Antecubital fossa    0.24                        +-----------------+-------------+----------+--------+ Prox  forearm         0.17                        +-----------------+-------------+----------+--------+ Summary: Right: Superficial thrombophlebitis of the right cephalic vein at  the AC fossa. All other segments are patent. Widely patent        right basilic vein. See above chart for vein measurements. Left: Widely patent superficial veins of the left upper extremity.       See above chart for vein measurements. *See table(s) above for measurements and observations.  Diagnosing physician: Deitra Mayo MD Electronically signed by Deitra Mayo MD on 08/18/2021 at 10:33:56 AM.    Final     Scheduled Meds:  sodium chloride   Intravenous Once   atenolol  50 mg Oral Daily   atorvastatin  20 mg Oral q1800   calcitRIOL  0.5 mcg Oral Daily   calcium acetate  1,334 mg Oral TID WC   Chlorhexidine Gluconate Cloth  6 each Topical Q0600   Chlorhexidine Gluconate Cloth  6 each Topical Q0600   heparin  5,000 Units Subcutaneous Q8H   hydrALAZINE  50 mg Oral Q8H   hydrocortisone cream   Topical BID   insulin aspart  0-20 Units Subcutaneous TID WC   insulin aspart  0-5 Units Subcutaneous QHS   insulin glargine-yfgn  35 Units Subcutaneous Daily   multivitamin  1 tablet Oral QHS   nortriptyline  10 mg Oral BID   ondansetron (ZOFRAN) IV  4 mg Intravenous Q6H   pantoprazole (PROTONIX) IV  40 mg Intravenous Q12H   polyethylene glycol  17 g Oral Daily   rOPINIRole  0.5 mg Oral QHS   sodium chloride flush  10-40 mL Intracatheter Q12H   tuberculin  5 Units Intradermal Once   Continuous Infusions:   ceFAZolin (ANCEF) IV     ferric gluconate (FERRLECIT) IVPB 250 mg (08/17/21 2238)     LOS: 5 days   Shelly Coss, MD [facility] P6/21/2023, 11:41 AM

## 2021-08-20 ENCOUNTER — Encounter (HOSPITAL_COMMUNITY): Payer: Self-pay | Admitting: Vascular Surgery

## 2021-08-20 LAB — BASIC METABOLIC PANEL
Anion gap: 11 (ref 5–15)
BUN: 26 mg/dL — ABNORMAL HIGH (ref 6–20)
CO2: 23 mmol/L (ref 22–32)
Calcium: 5.9 mg/dL — CL (ref 8.9–10.3)
Chloride: 96 mmol/L — ABNORMAL LOW (ref 98–111)
Creatinine, Ser: 8.62 mg/dL — ABNORMAL HIGH (ref 0.44–1.00)
GFR, Estimated: 6 mL/min — ABNORMAL LOW (ref >60)
Glucose, Bld: 314 mg/dL — ABNORMAL HIGH (ref 70–99)
Potassium: 5 mmol/L (ref 3.5–5.1)
Sodium: 130 mmol/L — ABNORMAL LOW (ref 135–145)

## 2021-08-20 LAB — CBC
HCT: 25.2 % — ABNORMAL LOW (ref 36.0–46.0)
Hemoglobin: 7.7 g/dL — ABNORMAL LOW (ref 12.0–15.0)
MCH: 24.1 pg — ABNORMAL LOW (ref 26.0–34.0)
MCHC: 30.6 g/dL (ref 30.0–36.0)
MCV: 78.8 fL — ABNORMAL LOW (ref 80.0–100.0)
Platelets: 301 10*3/uL (ref 150–400)
RBC: 3.2 MIL/uL — ABNORMAL LOW (ref 3.87–5.11)
RDW: 17.1 % — ABNORMAL HIGH (ref 11.5–15.5)
WBC: 9.1 10*3/uL (ref 4.0–10.5)
nRBC: 0 % (ref 0.0–0.2)

## 2021-08-20 LAB — GLUCOSE, CAPILLARY
Glucose-Capillary: 441 mg/dL — ABNORMAL HIGH (ref 70–99)
Glucose-Capillary: 473 mg/dL — ABNORMAL HIGH (ref 70–99)
Glucose-Capillary: 299 mg/dL — ABNORMAL HIGH (ref 70–99)

## 2021-08-20 MED ORDER — ATORVASTATIN CALCIUM 20 MG PO TABS: 20.0000 mg | ORAL_TABLET | Freq: Every day | ORAL | 0 refills | Status: DC

## 2021-08-20 MED ORDER — CALCIUM ACETATE (PHOS BINDER) 667 MG PO CAPS: 1334.0000 mg | ORAL_CAPSULE | Freq: Three times a day (TID) | ORAL | 1 refills | Status: DC

## 2021-08-20 MED ORDER — INSULIN ASPART 100 UNIT/ML IJ SOLN: 0.0000 [IU] | Freq: Three times a day (TID) | INTRAMUSCULAR | Status: DC

## 2021-08-20 MED ORDER — POLYETHYLENE GLYCOL 3350 17 G PO PACK: 17.0000 g | PACK | Freq: Every day | ORAL | 0 refills | Status: AC

## 2021-08-20 MED ORDER — HYDROMORPHONE HCL 1 MG/ML IJ SOLN
0.5000 mg | INTRAMUSCULAR | Status: DC | PRN
  Administered 2021-08-20: 0.5 mg via INTRAMUSCULAR

## 2021-08-20 MED ORDER — CALCIUM GLUCONATE-NACL 2-0.675 GM/100ML-% IV SOLN
2.0000 g | Freq: Once | INTRAVENOUS | Status: DC
  Filled 2021-08-20: qty 100

## 2021-08-20 MED ORDER — SENNOSIDES-DOCUSATE SODIUM 8.6-50 MG PO TABS: 1.0000 | ORAL_TABLET | Freq: Two times a day (BID) | ORAL | 0 refills | Status: AC

## 2021-08-20 MED ORDER — LANTUS SOLOSTAR 100 UNIT/ML ~~LOC~~ SOPN: 35.0000 [IU] | PEN_INJECTOR | Freq: Every day | SUBCUTANEOUS | 0 refills | Status: DC

## 2021-08-20 MED ORDER — CALCITRIOL 0.5 MCG PO CAPS: 0.5000 ug | ORAL_CAPSULE | Freq: Every day | ORAL | 1 refills | Status: DC

## 2021-08-20 MED ORDER — HUMALOG 100 UNIT/ML IJ SOLN: 0.0000 [IU] | Freq: Three times a day (TID) | INTRAMUSCULAR | 0 refills | Status: DC

## 2021-08-20 MED ORDER — HYDRALAZINE HCL 50 MG PO TABS: 50.0000 mg | ORAL_TABLET | Freq: Three times a day (TID) | ORAL | 1 refills | Status: DC

## 2021-08-20 NOTE — Progress Notes (Signed)
AVS given and reviewed with pt. Medications discussed. All questions answered to satisfaction. Pt verbalized understanding of information given. Pt to be escorted off the unit with all belongings via wheelchair by staff member.  

## 2021-08-20 NOTE — Progress Notes (Addendum)
TB skin test result faxed to [facility] admissions this morning. Contacted admissions as well to make them aware that results have been faxed and that pt is ready for d/c. Awaiting final approval for [facility] clinic.  Melven Sartorius Renal Navigator [phone]  Addendum at 11:10 am: Pt has been accepted at [facility] on MWF. Pt can start tomorrow. Pt needs to arrive tomorrow at 11:00 to complete paperwork and then have treatment. Clinic will provide pt appt times for next week at tomorrow's appt. Met with pt at bedside. Provided above details and pt agreeable to plan. Information sheet provided to pt as well with above details noted. Will add arrangements to AVS as well. Spoke to [place] at clinic. Joy aware pt will d/c today and start tomorrow. Will fax d/c summary to clinic once completed for continuation of care. Update provided to attending, nephrologist,and pt's RN.   Addendum at 12:33 pm: D/C summary and last renal note faxed to clinic for continuation of care.

## 2021-08-20 NOTE — Discharge Summary (Addendum)
Physician Discharge Summary  Sally Porter LPF:[id number] DOB: [birth year] DOA: 08/14/2021  PCP: Bridget Hartshorn, NP  Admit date: 08/14/2021 Discharge date: 08/20/2021  Admitted From: Home Disposition:  Home  Discharge Condition:Stable CODE STATUS:FULL Diet recommendation: renal  Brief/Interim Summary:   Patient is a 41 year old female with history of hypertension, diabetes type 2, hyperlipidemia, CKD stage IV, restless syndrome who presented to [facility] with complaints of nausea, vomiting, anorexia.  She was on dialysis in 2019 while in hospital then for only 2 weeks as an outpatient, she did not follow-up with nephrology after that because of noncompliance.  She has not seen a physician for the last 2 years.  [facility].  Blood work showed creatinine of 20.8.  CT abdomen chest was negative for any acute findings no hydronephrosis.  Case was discussed with nephrology and patient was transferred to [facility] for dialysis. Underwent dialysis catheter placement ,initiated dialysis.  Nephrology following. Underwent  left arm AV fistula creation  by vascular surgery.  Outpatient dialysis center has been arranged.  Patient is medically stable for discharge.  Cleared by nephrology  Following problems were addressed during her hospitalization:  AKI on CKD stage IV: Presented with nausea, vomiting, anorexia.  She was on dialysis in 2019 while in hospital then for only 2 weeks as an outpatient, she did not follow-up with nephrology after that because of noncompliance.  Lab work on presentation showed creatinine of 20.  She was frankly volume overloaded and uremic.  She was transferred to [facility] for initiation on dialysis. Dialysis started after placement of hemodialysis catheter.  Vascular surgery placed AV fistula.  Outpatient dialysis center arranged.  Nephrology cleared for discharge.  Continue phosphate binders   Severe hypocalcemia: Most likely secondary  to CKD/ESRD/renal osteodystrophy.  Continue to monitor as an outpatient   Acute hypoxic respiratory failure: resolved.  This is secondary to volume overload.  Now on room air   Normocytic anemia: Likely associated  with CKD.No evidence of acute blood loss.  Given a unit of blood transfusion during this hospitalization.  Continue to monitor CBC.Now stable at 8 She also microcytosis.  Anemia panel showed low iron, given IV iron   Uncontrolled diabetes type 2 with hyperglycemia:  Uses insulin at home.    Hemoglobin A1c of 10.  Her sugars have been erratic.  She follows with endocrinology at [facility].     Hypertension: Blood pressure currently stable.  Continue atenolol, hydralazine   Hyperlipidemia: Continue statin   Morbid obesity: BMI 41.4   Opiate dependence: Takes Percocet at home.  Also receives alprazolam.  Counseled for limiting   Bilateral lower extremity edema: She states this is chronic and secondary to multiple surgeries on her bilateral lower extremities.  Part of it could be a component of volume overload       Discharge Diagnoses:  Principal Problem:   Acute renal failure superimposed on stage 4 chronic kidney disease (HCC) Active Problems:   Mixed hyperlipidemia   Essential hypertension, benign   Intractable nausea and vomiting   Obesity, Class III, BMI 40-49.9 (morbid obesity) ([place])   Opioid dependence ([place])   Uncontrolled type 2 diabetes mellitus with hyperglycemia, with long-term current use of insulin [facility])    Discharge Instructions  Discharge Instructions     Diet - low sodium heart healthy   Complete by: As directed    Discharge instructions   Complete by: As directed    1)Please go for dialysis at outpatient dialysis center  tomorrow 2)Follow up with the nephrologist as an outpatient 3)Continue your medications as instructed.  Monitor your blood sugars at home 4)Follow up with vascular surgery in 4 to 6 weeks.  Name and number of the provider group  has been attached   Increase activity slowly   Complete by: As directed    No wound care   Complete by: As directed       Allergies as of 08/20/2021       Reactions   Dilaudid [hydromorphone] Itching   Lyrica [pregabalin] Itching   Morphine And Related Itching        Medication List     STOP taking these medications    calcitonin (salmon) 200 UNIT/ACT nasal spray Commonly known as: MIACALCIN/FORTICAL   insulin lispro 100 UNIT/ML KwikPen Commonly known as: HumaLOG KwikPen Replaced by: HumaLOG 100 UNIT/ML injection   lisinopril 30 MG tablet Commonly known as: ZESTRIL   sodium bicarbonate 650 MG tablet       TAKE these medications    ALPRAZolam 0.5 MG tablet Commonly known as: XANAX Take 0.5 mg by mouth at bedtime.   aspirin-acetaminophen-caffeine 250-250-65 MG tablet Commonly known as: EXCEDRIN MIGRAINE Take 1 tablet by mouth every 6 (six) hours as needed for headache.   atenolol 50 MG tablet Commonly known as: TENORMIN TAKE 1 TABLET BY MOUTH DAILY   atorvastatin 20 MG tablet Commonly known as: LIPITOR Take 1 tablet (20 mg total) by mouth daily at 6 PM. What changed: when to take this   calcitRIOL 0.5 MCG capsule Commonly known as: ROCALTROL Take 1 capsule (0.5 mcg total) by mouth daily. Start taking on: August 21, 2021   calcium acetate 667 MG capsule Commonly known as: PHOSLO Take 2 capsules (1,334 mg total) by mouth 3 (three) times daily with meals.   ferrous sulfate 325 (65 FE) MG tablet Take 325 mg by mouth daily with breakfast.   FreeStyle Libre 14 Day Sensor Misc Inject 1 each into the skin every 14 (fourteen) days. Use as directed.   HumaLOG 100 UNIT/ML injection Generic drug: insulin lispro Inject 0-0.15 mLs (0-15 Units total) into the skin 3 (three) times daily with meals. Replaces: insulin lispro 100 UNIT/ML KwikPen   hydrALAZINE 50 MG tablet Commonly known as: APRESOLINE Take 1 tablet (50 mg total) by mouth every 8 (eight) hours.    hydrocortisone cream 1 % Apply 1 Application topically 2 (two) times daily as needed for itching.   Lantus SoloStar 100 UNIT/ML Solostar Pen Generic drug: insulin glargine Inject 35 Units into the skin at bedtime. What changed: how much to take   nortriptyline 10 MG capsule Commonly known as: PAMELOR Take 10 mg by mouth 2 (two) times daily.   oxyCODONE-acetaminophen 10-325 MG tablet Commonly known as: PERCOCET Take 1 tablet by mouth every 6 (six) hours as needed for pain.   polyethylene glycol 17 g packet Commonly known as: MIRALAX / GLYCOLAX Take 17 g by mouth daily.   rOPINIRole 0.5 MG tablet Commonly known as: REQUIP Take 0.5 mg by mouth at bedtime.   senna-docusate 8.6-50 MG tablet Commonly known as: Senokot-S Take 1 tablet by mouth 2 (two) times daily.   ZINC PO Take 1 tablet by mouth daily.        Follow-up Information     [facility] Follow up in 6 week(s).   Specialty: Vascular Surgery Why: Office will call to arrange your appt(s) (sent) Contact information: [address] [place] Dortches [phone]  [place], [place] on 08/21/2021.   Why: Schedule is Monday,Wednesday, Friday.  Please arrive Friday, June 23 at 11:00 to complete paperwork and then complete treatment. Contact information: [place] [address] [phone]                Allergies  Allergen Reactions   Dilaudid [Hydromorphone] Itching   Lyrica [Pregabalin] Itching   Morphine And Related Itching    Consultations: Nephrology, vascular surgery   Procedures/Studies: VAS [place] UPPER EXT VEIN MAPPING (PRE-OP AVF)  Result Date: 08/18/2021 UPPER EXTREMITY VEIN MAPPING Patient Name:  VONETTA FOULK  Date of Exam:   08/18/2021 Medical Rec #: [id number]         Accession #:    [id number] Date of Birth: 1980-07-04          Patient Gender: F Patient Age:   45 years Exam Location:  [facility] Procedure:      VAS [place] UPPER EXT VEIN MAPPING (PRE-OP AVF) Referring Phys: Jamelle Haring --------------------------------------------------------------------------------  Comparison Study: No previous exam noted. Performing Technologist: Bobetta Lime BS, RVT  Examination Guidelines: A complete evaluation includes B-mode imaging, spectral Doppler, color Doppler, and power Doppler as needed of all accessible portions of each vessel. Bilateral testing is considered an integral part of a complete examination. Limited examinations for reoccurring indications may be performed as noted. +--------------+-------------+----------+---------+ Right CephalicDiameter (cm)Depth (cm)Findings  +--------------+-------------+----------+---------+ Shoulder          0.26                         +--------------+-------------+----------+---------+ Prox upper arm    0.33                         +--------------+-------------+----------+---------+ Mid upper arm     0.35               branching +--------------+-------------+----------+---------+ Dist upper arm    0.37                         +--------------+-------------+----------+---------+ Prox forearm      0.28                         +--------------+-------------+----------+---------+ Mid forearm       0.26                         +--------------+-------------+----------+---------+ Wrist             0.24                         +--------------+-------------+----------+---------+ +-----------------+-------------+----------+--------+ Right Basilic    Diameter (cm)Depth (cm)Findings +-----------------+-------------+----------+--------+ Prox upper arm       0.31                        +-----------------+-------------+----------+--------+ Mid upper arm        0.32                        +-----------------+-------------+----------+--------+ Dist upper arm       0.42                         +-----------------+-------------+----------+--------+ Antecubital fossa    0.37                        +-----------------+-------------+----------+--------+  Prox forearm         0.36                        +-----------------+-------------+----------+--------+ The cephalic vein at the antecubital fossa is non-compressible. Superficial thrombophlebectomy of the right cephalic at the [facility] fossa. +-----------------+-------------+----------+---------+ Left Cephalic    Diameter (cm)Depth (cm)Findings  +-----------------+-------------+----------+---------+ Shoulder             0.55                         +-----------------+-------------+----------+---------+ Prox upper arm       0.57                         +-----------------+-------------+----------+---------+ Mid upper arm        0.54               branching +-----------------+-------------+----------+---------+ Dist upper arm       0.49               branching +-----------------+-------------+----------+---------+ Antecubital fossa    0.58                         +-----------------+-------------+----------+---------+ Prox forearm         0.32                         +-----------------+-------------+----------+---------+ Mid forearm          0.21                         +-----------------+-------------+----------+---------+ Dist forearm         0.17                         +-----------------+-------------+----------+---------+ Wrist                0.15                         +-----------------+-------------+----------+---------+ +-----------------+-------------+----------+--------+ Left Basilic     Diameter (cm)Depth (cm)Findings +-----------------+-------------+----------+--------+ Prox upper arm       0.43                        +-----------------+-------------+----------+--------+ Mid upper arm        0.31                        +-----------------+-------------+----------+--------+ Dist upper  arm       0.45                        +-----------------+-------------+----------+--------+ Antecubital fossa    0.24                        +-----------------+-------------+----------+--------+ Prox forearm         0.17                        +-----------------+-------------+----------+--------+ Summary: Right: Superficial thrombophlebitis of the right cephalic vein at        the [facility] fossa. All other segments are patent. Widely patent        right basilic vein. See above chart for vein measurements. Left: Widely patent  superficial veins of the left upper extremity.       See above chart for vein measurements. *See table(s) above for measurements and observations.  Diagnosing physician: Deitra Mayo MD Electronically signed by Deitra Mayo MD on 08/18/2021 at 10:33:56 AM.    Final    IR Fluoro Guide CV Line Right  Result Date: 08/15/2021 INDICATION: 41 year old female with end-stage renal disease requiring central venous access for hemodialysis. EXAM: TUNNELED CENTRAL VENOUS HEMODIALYSIS CATHETER PLACEMENT WITH ULTRASOUND AND FLUOROSCOPIC GUIDANCE MEDICATIONS: Ancef 2 gm IV . The antibiotic was given in an appropriate time interval prior to skin puncture. ANESTHESIA/SEDATION: Moderate (conscious) sedation was employed during this procedure. A total of Versed 2 mg and Fentanyl 100 mcg was administered intravenously. Moderate Sedation Time: 11 minutes. The patient's level of consciousness and vital signs were monitored continuously by radiology nursing throughout the procedure under my direct supervision. FLUOROSCOPY TIME:  0 minutes 6 seconds (1 mGy). COMPLICATIONS: None immediate. PROCEDURE: Informed written consent was obtained from the patient after a discussion of the risks, benefits, and alternatives to treatment. Questions regarding the procedure were encouraged and answered. The right neck and chest were prepped with chlorhexidine in a sterile fashion, and a sterile drape was  applied covering the operative field. Maximum barrier sterile technique with sterile gowns and gloves were used for the procedure. A timeout was performed prior to the initiation of the procedure. After creating a small venotomy incision, a 21 gauge micropuncture kit was utilized to access the internal jugular vein. Real-time ultrasound guidance was utilized for vascular access including the acquisition of a permanent ultrasound image documenting patency of the accessed vessel. A Rosen wire was advanced to the level of the IVC and the micropuncture sheath was exchanged for an 8 Fr dilator. A 14.5 French tunneled hemodialysis catheter measuring 19 cm from tip to cuff was tunneled in a retrograde fashion from the anterior chest wall to the venotomy incision. Serial dilation was then performed an a peel-away sheath was placed. The catheter was then placed through the peel-away sheath with the catheter tip ultimately positioned within the right atrium. Final catheter positioning was confirmed and documented with a spot radiographic image. The catheter aspirates and flushes normally. The catheter was flushed with appropriate volume heparin dwells. The catheter exit site was secured with a 0-Silk retention suture. The venotomy incision was closed with Dermabond. Sterile dressings were applied. The patient tolerated the procedure well without immediate post procedural complication. IMPRESSION: Successful placement of 19 cm tip to cuff tunneled hemodialysis catheter via the right internal jugular vein with catheter tip terminating within the right atrium. The catheter is ready for immediate use. Ruthann Cancer, MD [facility] Radiology Electronically Signed   By: Ruthann Cancer M.D.   On: 08/15/2021 15:28   IR US Guide Vasc Access Right  Result Date: 08/15/2021 INDICATION: 41 year old female with end-stage renal disease requiring central venous access for hemodialysis. EXAM:  TUNNELED CENTRAL VENOUS HEMODIALYSIS CATHETER PLACEMENT WITH ULTRASOUND AND FLUOROSCOPIC GUIDANCE MEDICATIONS: Ancef 2 gm IV . The antibiotic was given in an appropriate time interval prior to skin puncture. ANESTHESIA/SEDATION: Moderate (conscious) sedation was employed during this procedure. A total of Versed 2 mg and Fentanyl 100 mcg was administered intravenously. Moderate Sedation Time: 11 minutes. The patient's level of consciousness and vital signs were monitored continuously by radiology nursing throughout the procedure under my direct supervision. FLUOROSCOPY TIME:  0 minutes 6 seconds (1 mGy). COMPLICATIONS: None immediate. PROCEDURE: Informed written consent  was obtained from the patient after a discussion of the risks, benefits, and alternatives to treatment. Questions regarding the procedure were encouraged and answered. The right neck and chest were prepped with chlorhexidine in a sterile fashion, and a sterile drape was applied covering the operative field. Maximum barrier sterile technique with sterile gowns and gloves were used for the procedure. A timeout was performed prior to the initiation of the procedure. After creating a small venotomy incision, a 21 gauge micropuncture kit was utilized to access the internal jugular vein. Real-time ultrasound guidance was utilized for vascular access including the acquisition of a permanent ultrasound image documenting patency of the accessed vessel. A Rosen wire was advanced to the level of the IVC and the micropuncture sheath was exchanged for an 8 Fr dilator. A 14.5 French tunneled hemodialysis catheter measuring 19 cm from tip to cuff was tunneled in a retrograde fashion from the anterior chest wall to the venotomy incision. Serial dilation was then performed an a peel-away sheath was placed. The catheter was then placed through the peel-away sheath with the catheter tip ultimately positioned within the right atrium. Final catheter positioning was  confirmed and documented with a spot radiographic image. The catheter aspirates and flushes normally. The catheter was flushed with appropriate volume heparin dwells. The catheter exit site was secured with a 0-Silk retention suture. The venotomy incision was closed with Dermabond. Sterile dressings were applied. The patient tolerated the procedure well without immediate post procedural complication. IMPRESSION: Successful placement of 19 cm tip to cuff tunneled hemodialysis catheter via the right internal jugular vein with catheter tip terminating within the right atrium. The catheter is ready for immediate use. Ruthann Cancer, MD [facility] Radiology Electronically Signed   By: Ruthann Cancer M.D.   On: 08/15/2021 15:28      Subjective: Patient seen and examined at the bedside this morning.  Hemodynamically stable for discharge today.  She is adamant about going home today  Discharge Exam: Vitals:   08/20/21 0613 08/20/21 0915  BP: (!) 105/59 116/61  Pulse: 92 88  Resp: 18 18  Temp: 98.3 F (36.8 C) 98.4 F (36.9 C)  SpO2: (!) 89% 94%   Vitals:   08/19/21 1709 08/19/21 2104 08/20/21 0613 08/20/21 0915  BP: (!) 152/63 (!) 144/85 (!) 105/59 116/61  Pulse: 90 94 92 88  Resp: _0 Temp: 98.2 F (36.8 C) 99 F (37.2 C) 98.3 F (36.8 C) 98.4 F (36.9 C)  TempSrc: Oral Oral Oral Oral  SpO2: 100% 100% (!) 89% 94%  Weight:      Height:        General: Pt is alert, awake, not in acute distress Cardiovascular: RRR, S1/S2 +, no rubs, no gallops, dialysis catheter on the right chest Respiratory: CTA bilaterally, no wheezing, no rhonchi Abdominal: Soft, NT, ND, bowel sounds + Extremities: no edema, no cyanosis    The results of significant diagnostics from this hospitalization (including imaging, microbiology, ancillary and laboratory) are listed below for reference.     Microbiology: Recent Results (from the past 240 hour(s))   MRSA Next Gen by PCR, Nasal     Status: None   Collection Time: 08/14/21 11:47 AM   Specimen: Nasal Mucosa; Nasal Swab  Result Value Ref Range Status   MRSA by PCR Next Gen NOT DETECTED NOT DETECTED Final    Comment: (NOTE) The GeneXpert MRSA Assay (FDA approved for NASAL specimens only), is one component of a  comprehensive MRSA colonization surveillance program. It is not intended to diagnose MRSA infection nor to guide or monitor treatment for MRSA infections. Test performance is not FDA approved in patients less than 49 years old. Performed at [facility], [address], [place], [place] [address]   Surgical pcr screen     Status: None   Collection Time: 08/19/21  6:42 AM   Specimen: Nasal Mucosa; Nasal Swab  Result Value Ref Range Status   MRSA, PCR NEGATIVE NEGATIVE Final   Staphylococcus aureus NEGATIVE NEGATIVE Final    Comment: (NOTE) The Xpert SA Assay (FDA approved for NASAL specimens in patients 56 years of age and older), is one component of a comprehensive surveillance program. It is not intended to diagnose infection nor to guide or monitor treatment. Performed at [facility], [place] [address] [place], [place] [address]      Labs: BNP (last 3 results) No results for input(s): "BNP" in the last 8760 hours. Basic Metabolic Panel: Recent Labs  Lab 08/14/21 1331 08/15/21 0316 08/15/21 1600 08/16/21 0303 08/17/21 0420 08/18/21 0346 08/19/21 0808 08/20/21 0418  NA 134* 134*  --  131* 134* 132* 131* 130*  K 3.7 4.1  --  3.7 3.6 3.8 4.2 5.0  CL 94* 96*  --  94* 95* 95*  --  96*  CO2 18* 19*  --  21* 20* 22  --  23  GLUCOSE 333* 315*  --  217* 69* 276*  --  314*  BUN 94* 98*  --  63* 69* 40*  --  26*  CREATININE 19.37* 19.70*  --  14.85* 16.26* 10.60*  --  8.62*  CALCIUM 4.2* 4.3*  --  5.2* 4.9* 5.6*  --  5.9*  PHOS 8.4* 9.2* 6.6*  --   --   --   --   --    Liver Function Tests: Recent Labs  Lab 08/14/21 1331 08/15/21 0316  ALBUMIN 2.7* 2.5*    No results for input(s): "LIPASE", "AMYLASE" in the last 168 hours. No results for input(s): "AMMONIA" in the last 168 hours. CBC: Recent Labs  Lab 08/14/21 1331 08/15/21 0316 08/16/21 0303 08/17/21 0420 08/19/21 0808 08/20/21 0418  WBC 11.6* 9.4 10.8* 10.0  --  9.1  HGB 7.8* 6.9* 8.3* 8.5* 8.5* 7.7*  HCT 25.5* 23.0* 26.3* 27.7* 25.0* 25.2*  MCV 76.8* 77.4* 77.1* 78.7*  --  78.8*  PLT 250 232 236 242  --  301   Cardiac Enzymes: No results for input(s): "CKTOTAL", "CKMB", "CKMBINDEX", "TROPONINI" in the last 168 hours. BNP: Invalid input(s): "POCBNP" CBG: Recent Labs  Lab 08/19/21 1647 08/19/21 2104 08/20/21 0733 08/20/21 0859 08/20/21 1144  GLUCAP 150* 151* 441* 473* 299*   D-Dimer No results for input(s): "DDIMER" in the last 72 hours. Hgb A1c No results for input(s): "HGBA1C" in the last 72 hours. Lipid Profile No results for input(s): "CHOL", "HDL", "LDLCALC", "TRIG", "CHOLHDL", "LDLDIRECT" in the last 72 hours. Thyroid function studies No results for input(s): "TSH", "T4TOTAL", "T3FREE", "THYROIDAB" in the last 72 hours.  Invalid input(s): "FREET3" Anemia work up No results for input(s): "VITAMINB12", "FOLATE", "FERRITIN", "TIBC", "IRON", "RETICCTPCT" in the last 72 hours. Urinalysis No results found for: "COLORURINE", "APPEARANCEUR", "LABSPEC", "PHURINE", "GLUCOSEU", "HGBUR", "BILIRUBINUR", "KETONESUR", "PROTEINUR", "UROBILINOGEN", "NITRITE", "LEUKOCYTESUR" Sepsis Labs Recent Labs  Lab 08/15/21 0316 08/16/21 0303 08/17/21 0420 08/20/21 0418  WBC 9.4 10.8* 10.0 9.1   Microbiology Recent Results (from the past 240 hour(s))  MRSA Next Gen by PCR, Nasal  Status: None   Collection Time: 08/14/21 11:47 AM   Specimen: Nasal Mucosa; Nasal Swab  Result Value Ref Range Status   MRSA by PCR Next Gen NOT DETECTED NOT DETECTED Final    Comment: (NOTE) The GeneXpert MRSA Assay (FDA approved for NASAL specimens only), is one component of a comprehensive  MRSA colonization surveillance program. It is not intended to diagnose MRSA infection nor to guide or monitor treatment for MRSA infections. Test performance is not FDA approved in patients less than 76 years old. Performed at [facility], [address], [place], [place] [address]   Surgical pcr screen     Status: None   Collection Time: 08/19/21  6:42 AM   Specimen: Nasal Mucosa; Nasal Swab  Result Value Ref Range Status   MRSA, PCR NEGATIVE NEGATIVE Final   Staphylococcus aureus NEGATIVE NEGATIVE Final    Comment: (NOTE) The Xpert SA Assay (FDA approved for NASAL specimens in patients 71 years of age and older), is one component of a comprehensive surveillance program. It is not intended to diagnose infection nor to guide or monitor treatment. Performed at [facility], [place] [address] [place], [place] [address]     Please note: You were cared for by a hospitalist during your hospital stay. Once you are discharged, your primary care physician will handle any further medical issues. Please note that NO REFILLS for any discharge medications will be authorized once you are discharged, as it is imperative that you return to your primary care physician (or establish a relationship with a primary care physician if you do not have one) for your post hospital discharge needs so that they can reassess your need for medications and monitor your lab values.    Time coordinating discharge: 40 minutes  SIGNED:   Shelly Coss, MD  [facility] 08/20/2021, 2:48 PM Pager [phone]  If 7PM-7AM, please contact night-coverage [email or web address] Password [email or web address]

## 2021-08-20 NOTE — Progress Notes (Signed)
Responded to IV consult;request for IV access. Pt alert;oriented;sitting up in bed. Stated she had an IV access in her hand yesterday. Also stated she was told by a lady physician yesterday that she has a clot to her R arm. I am unable to verify this by MD or nurses notes and per my conversation with the unit RN on duty;stated she is not aware of this information. Patient stated she cannot take PO meds for pain because it doesn't work for her. New L arm surgery very painful per patient, encourage ice pack, but stated she had that yesterday. No suitable veins seen upon assesment to R arm and patient's statement of having clots to R arm is not verified. No IV sticks attempted. Unit RN to call MD.

## 2021-08-20 NOTE — Plan of Care (Signed)
  Problem: Pain Managment: Goal: General experience of comfort will improve Outcome: Progressing   Problem: Safety: Goal: Ability to remain free from injury will improve Outcome: Progressing   Problem: Skin Integrity: Goal: Risk for impaired skin integrity will decrease Outcome: Progressing   

## 2021-08-20 NOTE — Progress Notes (Signed)
[id number]: CBG 441. Shelly Coss, MD paged. 20 units Novolog insulin admin per Shelly Coss, MD.   [phone]: Recheck CBG 473. Shelly Coss, MD paged. No new orders at this time.

## 2021-08-20 NOTE — Progress Notes (Signed)
Critical lab called calcium 5.9 Dr Bridgett Larsson notified.

## 2021-08-20 NOTE — Progress Notes (Signed)
TB results read in left forearm is negative shows no induration Dr Bridgett Larsson notified.

## 2021-08-21 DIAGNOSIS — E119 Type 2 diabetes mellitus without complications: Secondary | ICD-10-CM | POA: Diagnosis not present

## 2021-08-21 DIAGNOSIS — Z992 Dependence on renal dialysis: Secondary | ICD-10-CM | POA: Diagnosis not present

## 2021-08-21 DIAGNOSIS — Z23 Encounter for immunization: Secondary | ICD-10-CM | POA: Diagnosis not present

## 2021-08-21 DIAGNOSIS — N186 End stage renal disease: Secondary | ICD-10-CM | POA: Diagnosis not present

## 2021-08-21 DIAGNOSIS — Z13220 Encounter for screening for lipoid disorders: Secondary | ICD-10-CM | POA: Diagnosis not present

## 2021-08-24 DIAGNOSIS — Z23 Encounter for immunization: Secondary | ICD-10-CM | POA: Diagnosis not present

## 2021-08-24 DIAGNOSIS — N186 End stage renal disease: Secondary | ICD-10-CM | POA: Diagnosis not present

## 2021-08-24 DIAGNOSIS — Z992 Dependence on renal dialysis: Secondary | ICD-10-CM | POA: Diagnosis not present

## 2021-08-25 ENCOUNTER — Telehealth: Payer: Self-pay

## 2021-08-25 NOTE — Telephone Encounter (Signed)
Pt called c/o pain from her surgery. She states that she was not prescribed any at discharge because she already gets pain meds from another provider. She has used all those d/t the surgery, has no refills, and is requesting pain meds from this office.  Spoke with [place] PA, who stated that he would  not be able to prescribe any additional pain meds. She should not be in severe pain at this point in her recovery, so OTC meds should be adequate.  Reviewed pt's chart, returned pt's call, two identifiers used. Pt has some mild swelling, but is elevating frequently. Pt states her pain is 10/10 at the incision site. Pt educated on taking OTC meds for incisional pain, but pt stated that she could not take any Tylenol or Advil. Advised pt that if her pain was that severe, she should seek emergent care in the ED. Pt confirmed understanding.

## 2021-08-26 DIAGNOSIS — N186 End stage renal disease: Secondary | ICD-10-CM | POA: Diagnosis not present

## 2021-08-26 DIAGNOSIS — Z992 Dependence on renal dialysis: Secondary | ICD-10-CM | POA: Diagnosis not present

## 2021-08-26 DIAGNOSIS — Z23 Encounter for immunization: Secondary | ICD-10-CM | POA: Diagnosis not present

## 2021-08-28 DIAGNOSIS — D649 Anemia, unspecified: Secondary | ICD-10-CM | POA: Diagnosis not present

## 2021-08-28 DIAGNOSIS — Z87891 Personal history of nicotine dependence: Secondary | ICD-10-CM | POA: Diagnosis not present

## 2021-08-28 DIAGNOSIS — Z794 Long term (current) use of insulin: Secondary | ICD-10-CM | POA: Diagnosis not present

## 2021-08-28 DIAGNOSIS — Z992 Dependence on renal dialysis: Secondary | ICD-10-CM | POA: Diagnosis not present

## 2021-08-28 DIAGNOSIS — D631 Anemia in chronic kidney disease: Secondary | ICD-10-CM | POA: Diagnosis not present

## 2021-08-28 DIAGNOSIS — E1122 Type 2 diabetes mellitus with diabetic chronic kidney disease: Secondary | ICD-10-CM | POA: Diagnosis not present

## 2021-08-28 DIAGNOSIS — Z79899 Other long term (current) drug therapy: Secondary | ICD-10-CM | POA: Diagnosis not present

## 2021-08-28 DIAGNOSIS — Z23 Encounter for immunization: Secondary | ICD-10-CM | POA: Diagnosis not present

## 2021-08-28 DIAGNOSIS — I1311 Hypertensive heart and chronic kidney disease without heart failure, with stage 5 chronic kidney disease, or end stage renal disease: Secondary | ICD-10-CM | POA: Diagnosis not present

## 2021-08-28 DIAGNOSIS — N186 End stage renal disease: Secondary | ICD-10-CM | POA: Diagnosis not present

## 2021-08-28 DIAGNOSIS — Z885 Allergy status to narcotic agent status: Secondary | ICD-10-CM | POA: Diagnosis not present

## 2021-08-28 DIAGNOSIS — E785 Hyperlipidemia, unspecified: Secondary | ICD-10-CM | POA: Diagnosis not present

## 2021-08-31 DIAGNOSIS — Z23 Encounter for immunization: Secondary | ICD-10-CM | POA: Diagnosis not present

## 2021-08-31 DIAGNOSIS — Z992 Dependence on renal dialysis: Secondary | ICD-10-CM | POA: Diagnosis not present

## 2021-08-31 DIAGNOSIS — N186 End stage renal disease: Secondary | ICD-10-CM | POA: Diagnosis not present

## 2021-09-02 DIAGNOSIS — Z23 Encounter for immunization: Secondary | ICD-10-CM | POA: Diagnosis not present

## 2021-09-02 DIAGNOSIS — N186 End stage renal disease: Secondary | ICD-10-CM | POA: Diagnosis not present

## 2021-09-02 DIAGNOSIS — Z992 Dependence on renal dialysis: Secondary | ICD-10-CM | POA: Diagnosis not present

## 2021-09-04 DIAGNOSIS — Z992 Dependence on renal dialysis: Secondary | ICD-10-CM | POA: Diagnosis not present

## 2021-09-04 DIAGNOSIS — N186 End stage renal disease: Secondary | ICD-10-CM | POA: Diagnosis not present

## 2021-09-04 DIAGNOSIS — Z23 Encounter for immunization: Secondary | ICD-10-CM | POA: Diagnosis not present

## 2021-09-07 DIAGNOSIS — Z992 Dependence on renal dialysis: Secondary | ICD-10-CM | POA: Diagnosis not present

## 2021-09-07 DIAGNOSIS — N186 End stage renal disease: Secondary | ICD-10-CM | POA: Diagnosis not present

## 2021-09-07 DIAGNOSIS — Z23 Encounter for immunization: Secondary | ICD-10-CM | POA: Diagnosis not present

## 2021-09-09 DIAGNOSIS — Z992 Dependence on renal dialysis: Secondary | ICD-10-CM | POA: Diagnosis not present

## 2021-09-09 DIAGNOSIS — Z23 Encounter for immunization: Secondary | ICD-10-CM | POA: Diagnosis not present

## 2021-09-09 DIAGNOSIS — N186 End stage renal disease: Secondary | ICD-10-CM | POA: Diagnosis not present

## 2021-09-11 DIAGNOSIS — Z23 Encounter for immunization: Secondary | ICD-10-CM | POA: Diagnosis not present

## 2021-09-11 DIAGNOSIS — Z992 Dependence on renal dialysis: Secondary | ICD-10-CM | POA: Diagnosis not present

## 2021-09-11 DIAGNOSIS — N186 End stage renal disease: Secondary | ICD-10-CM | POA: Diagnosis not present

## 2021-09-14 ENCOUNTER — Other Ambulatory Visit: Payer: Self-pay | Admitting: *Deleted

## 2021-09-14 DIAGNOSIS — Z992 Dependence on renal dialysis: Secondary | ICD-10-CM | POA: Diagnosis not present

## 2021-09-14 DIAGNOSIS — N186 End stage renal disease: Secondary | ICD-10-CM | POA: Diagnosis not present

## 2021-09-14 DIAGNOSIS — Z23 Encounter for immunization: Secondary | ICD-10-CM | POA: Diagnosis not present

## 2021-09-15 DIAGNOSIS — G2581 Restless legs syndrome: Secondary | ICD-10-CM | POA: Diagnosis not present

## 2021-09-15 DIAGNOSIS — G4700 Insomnia, unspecified: Secondary | ICD-10-CM | POA: Diagnosis not present

## 2021-09-15 DIAGNOSIS — M25569 Pain in unspecified knee: Secondary | ICD-10-CM | POA: Diagnosis not present

## 2021-09-15 DIAGNOSIS — Z79891 Long term (current) use of opiate analgesic: Secondary | ICD-10-CM | POA: Diagnosis not present

## 2021-09-15 DIAGNOSIS — M79672 Pain in left foot: Secondary | ICD-10-CM | POA: Diagnosis not present

## 2021-09-15 DIAGNOSIS — M545 Low back pain, unspecified: Secondary | ICD-10-CM | POA: Diagnosis not present

## 2021-09-16 DIAGNOSIS — Z992 Dependence on renal dialysis: Secondary | ICD-10-CM | POA: Diagnosis not present

## 2021-09-16 DIAGNOSIS — Z23 Encounter for immunization: Secondary | ICD-10-CM | POA: Diagnosis not present

## 2021-09-16 DIAGNOSIS — N186 End stage renal disease: Secondary | ICD-10-CM | POA: Diagnosis not present

## 2021-09-18 DIAGNOSIS — Z23 Encounter for immunization: Secondary | ICD-10-CM | POA: Diagnosis not present

## 2021-09-18 DIAGNOSIS — N186 End stage renal disease: Secondary | ICD-10-CM | POA: Diagnosis not present

## 2021-09-18 DIAGNOSIS — Z992 Dependence on renal dialysis: Secondary | ICD-10-CM | POA: Diagnosis not present

## 2021-09-21 DIAGNOSIS — N186 End stage renal disease: Secondary | ICD-10-CM | POA: Diagnosis not present

## 2021-09-21 DIAGNOSIS — Z23 Encounter for immunization: Secondary | ICD-10-CM | POA: Diagnosis not present

## 2021-09-21 DIAGNOSIS — Z992 Dependence on renal dialysis: Secondary | ICD-10-CM | POA: Diagnosis not present

## 2021-09-23 DIAGNOSIS — Z23 Encounter for immunization: Secondary | ICD-10-CM | POA: Diagnosis not present

## 2021-09-23 DIAGNOSIS — Z992 Dependence on renal dialysis: Secondary | ICD-10-CM | POA: Diagnosis not present

## 2021-09-23 DIAGNOSIS — N186 End stage renal disease: Secondary | ICD-10-CM | POA: Diagnosis not present

## 2021-09-25 DIAGNOSIS — Z992 Dependence on renal dialysis: Secondary | ICD-10-CM | POA: Diagnosis not present

## 2021-09-25 DIAGNOSIS — Z23 Encounter for immunization: Secondary | ICD-10-CM | POA: Diagnosis not present

## 2021-09-25 DIAGNOSIS — N186 End stage renal disease: Secondary | ICD-10-CM | POA: Diagnosis not present

## 2021-09-28 DIAGNOSIS — N186 End stage renal disease: Secondary | ICD-10-CM | POA: Diagnosis not present

## 2021-09-28 DIAGNOSIS — Z992 Dependence on renal dialysis: Secondary | ICD-10-CM | POA: Diagnosis not present

## 2021-09-28 DIAGNOSIS — Z23 Encounter for immunization: Secondary | ICD-10-CM | POA: Diagnosis not present

## 2021-09-30 DIAGNOSIS — Z992 Dependence on renal dialysis: Secondary | ICD-10-CM | POA: Diagnosis not present

## 2021-09-30 DIAGNOSIS — N186 End stage renal disease: Secondary | ICD-10-CM | POA: Diagnosis not present

## 2021-10-02 DIAGNOSIS — N186 End stage renal disease: Secondary | ICD-10-CM | POA: Diagnosis not present

## 2021-10-02 DIAGNOSIS — Z992 Dependence on renal dialysis: Secondary | ICD-10-CM | POA: Diagnosis not present

## 2021-10-05 DIAGNOSIS — Z992 Dependence on renal dialysis: Secondary | ICD-10-CM | POA: Diagnosis not present

## 2021-10-05 DIAGNOSIS — N186 End stage renal disease: Secondary | ICD-10-CM | POA: Diagnosis not present

## 2021-10-06 ENCOUNTER — Encounter: Payer: Medicare Other | Admitting: Vascular Surgery

## 2021-10-06 ENCOUNTER — Encounter (HOSPITAL_COMMUNITY): Payer: Medicare Other

## 2021-10-07 DIAGNOSIS — Z992 Dependence on renal dialysis: Secondary | ICD-10-CM | POA: Diagnosis not present

## 2021-10-07 DIAGNOSIS — N186 End stage renal disease: Secondary | ICD-10-CM | POA: Diagnosis not present

## 2021-10-09 DIAGNOSIS — Z992 Dependence on renal dialysis: Secondary | ICD-10-CM | POA: Diagnosis not present

## 2021-10-09 DIAGNOSIS — N186 End stage renal disease: Secondary | ICD-10-CM | POA: Diagnosis not present

## 2021-10-12 DIAGNOSIS — N186 End stage renal disease: Secondary | ICD-10-CM | POA: Diagnosis not present

## 2021-10-12 DIAGNOSIS — Z992 Dependence on renal dialysis: Secondary | ICD-10-CM | POA: Diagnosis not present

## 2021-10-13 ENCOUNTER — Encounter: Payer: Self-pay | Admitting: Nurse Practitioner

## 2021-10-13 ENCOUNTER — Ambulatory Visit (INDEPENDENT_AMBULATORY_CARE_PROVIDER_SITE_OTHER): Payer: Medicare Other | Admitting: Nurse Practitioner

## 2021-10-13 ENCOUNTER — Other Ambulatory Visit: Payer: Self-pay | Admitting: Nurse Practitioner

## 2021-10-13 VITALS — BP 125/77 | HR 64 | Temp 98.6°F | Ht 68.0 in | Wt 243.0 lb

## 2021-10-13 DIAGNOSIS — E782 Mixed hyperlipidemia: Secondary | ICD-10-CM

## 2021-10-13 DIAGNOSIS — Z794 Long term (current) use of insulin: Secondary | ICD-10-CM

## 2021-10-13 DIAGNOSIS — I10 Essential (primary) hypertension: Secondary | ICD-10-CM

## 2021-10-13 DIAGNOSIS — E1165 Type 2 diabetes mellitus with hyperglycemia: Secondary | ICD-10-CM

## 2021-10-13 MED ORDER — ATENOLOL 50 MG PO TABS: 50.0000 mg | ORAL_TABLET | Freq: Every day | ORAL | 0 refills | Status: DC

## 2021-10-13 MED ORDER — ATORVASTATIN CALCIUM 20 MG PO TABS: 20.0000 mg | ORAL_TABLET | Freq: Every day | ORAL | 0 refills | Status: DC

## 2021-10-13 NOTE — Progress Notes (Signed)
New Patient Note  RE: Sally Porter MRN: [id number] DOB: 1980-04-11 Date of Office Visit: 10/13/2021  Chief Complaint: Establish Care  History of Present Illness:   Patient is establishing care today with a past medical history of essential hypertension, type 2 diabetes, hyperlipidemia, vitamin D deficiency. She presents for follow up of hypertension. Patient was diagnosed in 05/01/2019. The patient is tolerating the medication well without side effects. Compliance with treatment has been good; including taking medication as directed , maintains a low sodium diet  and following up as directed.   Mixed hyperlipidemia  Pt presents with hyperlipidemia. Patient was diagnosed in 05/01/2019.  Compliance with treatment has been good.  The patient is compliant with medications, maintains a low cholesterol diet , follows up as directed, . The patient denies experiencing any hypercholesterolemia related symptoms.     The patient presents with history of type II diabetes mellitus without complications. Patient was diagnosed in 30 years ago.  At the age of 41. Compliance with treatment has been good; the patient takes medication as directed , maintains a diabetic diet, follows up as directed , and is keeping a glucose diary. Sugars runs 1 36-240.Marland Kitchen Patient specifically denies associated symptoms, including blurred vision, fatigue, polydipsia, polyphagia and polyuria . Patient denies hypoglycemia. In regard to preventative care, the patient performs foot self-exams daily and last ophthalmology exam was in 2022.  She is currently on dialysis and goes to dialysis 3 times a week.  Monday, Wednesday and Friday.  Assessment and Plan: [name] is a 41 y.o. female with: Essential hypertension, benign Blood pressure is well controlled today on current medication no changes necessary.  Completed labs in dialysis on Friday.  Patient will fax in current labs.  Follow-up in 3 months.   Mixed hyperlipidemia No signs  and symptoms of worsening hyperlipidemia.  Follow-up in 6 months.  Uncontrolled type 2 diabetes mellitus with hyperglycemia, with long-term current use of insulin ([place]) Education completed low calorie diabetic diet recommended with exercise as tolerated and weight loss.  Patient scheduled with clinical pharmacist.  Discussed Dexacon meter placement.  Return in about 3 months (around 01/13/2022) for Chronic disease management.   Diagnostics:   Past Medical History: Patient Active Problem List   Diagnosis Date Noted   Acute renal failure superimposed on stage 4 chronic kidney disease ([place]) 08/14/2021   Intractable nausea and vomiting 08/14/2021   Obesity, Class III, BMI 40-49.9 (morbid obesity) ([place]) 08/14/2021   Opioid dependence ([place]) 08/14/2021   Uncontrolled type 2 diabetes mellitus with hyperglycemia, with long-term current use of insulin ([place]) 08/14/2021   Type 1 diabetes mellitus with stage 5 chronic kidney disease not on chronic dialysis ([place]) 05/01/2019   Mixed hyperlipidemia 05/01/2019   Essential hypertension, benign 05/01/2019   Vitamin D deficiency 05/01/2019   Personal history of noncompliance with medical treatment, presenting hazards to health 05/01/2019   Hyperkalemia 05/01/2019   Past Medical History:  Diagnosis Date   Diabetes mellitus without complication ([place])    Diabetes mellitus, type II ([place])    Glaucoma    Hyperlipidemia    Hypertension    Past Surgical History: Past Surgical History:  Procedure Laterality Date   AV FISTULA PLACEMENT Left 08/19/2021   Procedure: Creation of Brachial Cephalic LEFT ARM ARTERIOVENOUS (AV) FISTULA;  Surgeon: Cherre Robins, MD;  Location: [facility];  Service: Vascular;  Laterality: Left;  PERIPHERAL NERVE BLOCK   IR FLUORO GUIDE CV LINE RIGHT  08/15/2021   IR US GUIDE VASC ACCESS RIGHT  08/15/2021   Medication List:  Current Outpatient Medications  Medication Sig Dispense Refill   ALPRAZolam (XANAX) 0.5 MG tablet Take 0.5 mg  by mouth at bedtime.     calcitRIOL (ROCALTROL) 0.5 MCG capsule Take 1 capsule (0.5 mcg total) by mouth daily. 30 capsule 1   calcium acetate (PHOSLO) 667 MG capsule Take 2 capsules (1,334 mg total) by mouth 3 (three) times daily with meals. 90 capsule 1   Continuous Blood Gluc Sensor (FREESTYLE LIBRE 14 DAY SENSOR) MISC Inject 1 each into the skin every 14 (fourteen) days. Use as directed. 2 each 2   ferrous sulfate 325 (65 FE) MG tablet Take 325 mg by mouth daily with breakfast.     hydrALAZINE (APRESOLINE) 50 MG tablet Take 1 tablet (50 mg total) by mouth every 8 (eight) hours. 90 tablet 1   hydrocortisone cream 1 % Apply 1 Application topically 2 (two) times daily as needed for itching.     insulin glargine (LANTUS SOLOSTAR) 100 UNIT/ML Solostar Pen Inject 35 Units into the skin at bedtime. 20 mL 0   insulin lispro (HUMALOG) 100 UNIT/ML injection Inject 0-0.15 mLs (0-15 Units total) into the skin 3 (three) times daily with meals. 20 mL 0   nortriptyline (PAMELOR) 10 MG capsule Take 10 mg by mouth 2 (two) times daily.     Oxycodone HCl 10 MG TABS Take 10 mg by mouth every 6 (six) hours as needed.     polyethylene glycol (MIRALAX / GLYCOLAX) 17 g packet Take 17 g by mouth daily. 14 each 0   rOPINIRole (REQUIP) 0.5 MG tablet Take 0.5 mg by mouth at bedtime.     senna-docusate (SENOKOT-S) 8.6-50 MG tablet Take 1 tablet by mouth 2 (two) times daily. 60 tablet 0   atenolol (TENORMIN) 50 MG tablet Take 1 tablet (50 mg total) by mouth daily. 90 tablet 0   atorvastatin (LIPITOR) 20 MG tablet Take 1 tablet (20 mg total) by mouth daily at 6 PM. 30 tablet 0   No current facility-administered medications for this visit.   Allergies: Allergies  Allergen Reactions   Dilaudid [Hydromorphone] Itching   Lyrica [Pregabalin] Itching   Morphine And Related Itching   Social History: Social History   Socioeconomic History   Marital status: Single    Spouse name: Not on file   Number of children: Not on  file   Years of education: Not on file   Highest education level: Not on file  Occupational History   Not on file  Tobacco Use   Smoking status: Never   Smokeless tobacco: Never  Vaping Use   Vaping Use: Never used  Substance and Sexual Activity   Alcohol use: Not Currently   Drug use: Never   Sexual activity: Not on file  Other Topics Concern   Not on file  Social History Narrative   Not on file   Social Determinants of Health   Financial Resource Strain: Not on file  Food Insecurity: Not on file  Transportation Needs: Not on file  Physical Activity: Not on file  Stress: Not on file  Social Connections: Not on file       Family History: History reviewed. No pertinent family history.       Review of Systems  HENT: Negative.    Eyes: Negative.   Respiratory: Negative.    Cardiovascular: Negative.   Endocrine: Negative.   Genitourinary: Negative.   Musculoskeletal: Negative.   Skin: Negative.  Negative for rash.  All  other systems reviewed and are negative.  Objective: BP 125/77   Pulse 64   Temp 98.6 F (37 C)   Ht 5\' 8"  (1.727 m)   Wt 243 lb (110.2 kg)   LMP 09/09/2021 (Approximate)   SpO2 99%   BMI 36.95 kg/m  Body mass index is 36.95 kg/m. Physical Exam Vitals and nursing note reviewed.  Constitutional:      Appearance: Normal appearance. She is obese.  HENT:     Head: Normocephalic.     Right Ear: External ear normal.     Left Ear: External ear normal.     Nose: Nose normal.     Mouth/Throat:     Mouth: Mucous membranes are moist.     Pharynx: Oropharynx is clear.  Eyes:     Conjunctiva/sclera: Conjunctivae normal.  Cardiovascular:     Rate and Rhythm: Normal rate and regular rhythm.     Pulses: Normal pulses.     Heart sounds: Normal heart sounds.  Pulmonary:     Effort: Pulmonary effort is normal.     Breath sounds: Normal breath sounds. No rhonchi.  Abdominal:     General: Bowel sounds are normal.  Skin:    General: Skin is  warm.  Neurological:     General: No focal deficit present.     Mental Status: She is alert and oriented to person, place, and time.  Psychiatric:        Mood and Affect: Mood normal.        Behavior: Behavior normal.    The plan was reviewed with the patient/family, and all questions/concerned were addressed.  It was my pleasure to see Anneke today and participate in her care. Please feel free to contact me with any questions or concerns.  Sincerely,  Jac Canavan NP [place]

## 2021-10-13 NOTE — Assessment & Plan Note (Signed)
Education completed low calorie diabetic diet recommended with exercise as tolerated and weight loss.  Patient scheduled with clinical pharmacist.  Discussed Dexacon meter placement.

## 2021-10-13 NOTE — Assessment & Plan Note (Signed)
No signs and symptoms of worsening hyperlipidemia.  Follow-up in 6 months.

## 2021-10-13 NOTE — Assessment & Plan Note (Signed)
Blood pressure is well controlled today on current medication no changes necessary.  Completed labs in dialysis on Friday.  Patient will fax in current labs.  Follow-up in 3 months.

## 2021-10-13 NOTE — Patient Instructions (Signed)
Hypertension, Adult High blood pressure (hypertension) is when the force of blood pumping through the arteries is too strong. The arteries are the blood vessels that carry blood from the heart throughout the body. Hypertension forces the heart to work harder to pump blood and may cause arteries to become narrow or stiff. Untreated or uncontrolled hypertension can lead to a heart attack, heart failure, a stroke, kidney disease, and other problems. A blood pressure reading consists of a higher number over a lower number. Ideally, your blood pressure should be below 120/80. The first ("top") number is called the systolic pressure. It is a measure of the pressure in your arteries as your heart beats. The second ("bottom") number is called the diastolic pressure. It is a measure of the pressure in your arteries as the heart relaxes. What are the causes? The exact cause of this condition is not known. There are some conditions that result in high blood pressure. What increases the risk? Certain factors may make you more likely to develop high blood pressure. Some of these risk factors are under your control, including: Smoking. Not getting enough exercise or physical activity. Being overweight. Having too much fat, sugar, calories, or salt (sodium) in your diet. Drinking too much alcohol. Other risk factors include: Having a personal history of heart disease, diabetes, high cholesterol, or kidney disease. Stress. Having a family history of high blood pressure and high cholesterol. Having obstructive sleep apnea. Age. The risk increases with age. What are the signs or symptoms? High blood pressure may not cause symptoms. Very high blood pressure (hypertensive crisis) may cause: Headache. Fast or irregular heartbeats (palpitations). Shortness of breath. Nosebleed. Nausea and vomiting. Vision changes. Severe chest pain, dizziness, and seizures. How is this diagnosed? This condition is diagnosed by  measuring your blood pressure while you are seated, with your arm resting on a flat surface, your legs uncrossed, and your feet flat on the floor. The cuff of the blood pressure monitor will be placed directly against the skin of your upper arm at the level of your heart. Blood pressure should be measured at least twice using the same arm. Certain conditions can cause a difference in blood pressure between your right and left arms. If you have a high blood pressure reading during one visit or you have normal blood pressure with other risk factors, you may be asked to: Return on a different day to have your blood pressure checked again. Monitor your blood pressure at home for 1 week or longer. If you are diagnosed with hypertension, you may have other blood or imaging tests to help your health care provider understand your overall risk for other conditions. How is this treated? This condition is treated by making healthy lifestyle changes, such as eating healthy foods, exercising more, and reducing your alcohol intake. You may be referred for counseling on a healthy diet and physical activity. Your health care provider may prescribe medicine if lifestyle changes are not enough to get your blood pressure under control and if: Your systolic blood pressure is above 130. Your diastolic blood pressure is above 80. Your personal target blood pressure may vary depending on your medical conditions, your age, and other factors. Follow these instructions at home: Eating and drinking  Eat a diet that is high in fiber and potassium, and low in sodium, added sugar, and fat. An example of this eating plan is called the DASH diet. DASH stands for Dietary Approaches to Stop Hypertension. To eat this way: Eat   plenty of fresh fruits and vegetables. Try to fill one half of your plate at each meal with fruits and vegetables. Eat whole grains, such as whole-wheat pasta, brown rice, or whole-grain bread. Fill about one  fourth of your plate with whole grains. Eat or drink low-fat dairy products, such as skim milk or low-fat yogurt. Avoid fatty cuts of meat, processed or cured meats, and poultry with skin. Fill about one fourth of your plate with lean proteins, such as fish, chicken without skin, beans, eggs, or tofu. Avoid pre-made and processed foods. These tend to be higher in sodium, added sugar, and fat. Reduce your daily sodium intake. Many people with hypertension should eat less than 1,500 mg of sodium a day. Do not drink alcohol if: Your health care provider tells you not to drink. You are pregnant, may be pregnant, or are planning to become pregnant. If you drink alcohol: Limit how much you have to: 0-1 drink a day for women. 0-2 drinks a day for men. Know how much alcohol is in your drink. In the [place] one drink equals one 12 oz bottle of beer (355 mL), one 5 oz glass of wine (148 mL), or one 1 oz glass of hard liquor (44 mL). Lifestyle  Work with your health care provider to maintain a healthy body weight or to lose weight. Ask what an ideal weight is for you. Get at least 30 minutes of exercise that causes your heart to beat faster (aerobic exercise) most days of the week. Activities may include walking, swimming, or biking. Include exercise to strengthen your muscles (resistance exercise), such as Pilates or lifting weights, as part of your weekly exercise routine. Try to do these types of exercises for 30 minutes at least 3 days a week. Do not use any products that contain nicotine or tobacco. These products include cigarettes, chewing tobacco, and vaping devices, such as e-cigarettes. If you need help quitting, ask your health care provider. Monitor your blood pressure at home as told by your health care provider. Keep all follow-up visits. This is important. Medicines Take over-the-counter and prescription medicines only as told by your health care provider. Follow directions carefully. Blood  pressure medicines must be taken as prescribed. Do not skip doses of blood pressure medicine. Doing this puts you at risk for problems and can make the medicine less effective. Ask your health care provider about side effects or reactions to medicines that you should watch for. Contact a health care provider if you: Think you are having a reaction to a medicine you are taking. Have headaches that keep coming back (recurring). Feel dizzy. Have swelling in your ankles. Have trouble with your vision. Get help right away if you: Develop a severe headache or confusion. Have unusual weakness or numbness. Feel faint. Have severe pain in your chest or abdomen. Vomit repeatedly. Have trouble breathing. These symptoms may be an emergency. Get help right away. Call 911. Do not wait to see if the symptoms will go away. Do not drive yourself to the hospital. Summary Hypertension is when the force of blood pumping through your arteries is too strong. If this condition is not controlled, it may put you at risk for serious complications. Your personal target blood pressure may vary depending on your medical conditions, your age, and other factors. For most people, a normal blood pressure is less than 120/80. Hypertension is treated with lifestyle changes, medicines, or a combination of both. Lifestyle changes include losing weight, eating a healthy,   low-sodium diet, exercising more, and limiting alcohol. This information is not intended to replace advice given to you by your health care provider. Make sure you discuss any questions you have with your health care provider. Document Revised: 12/23/2020 Document Reviewed: 12/23/2020 Elsevier Patient Education  [place]. Diabetes Insipidus Diabetes insipidus (DI) is a rare condition that causes the body to produce more urine than normal. This leads to thirst and low fluid in the body (dehydration). In this condition, the urine is made mostly of  water, or dilute urine. DI affects mostly adults, but it can happen at any age. There are four types of DI: Central DI. This is the most common type. Dipsogenic DI. Nephrogenic DI. Gestational DI. The most common forms of this condition are caused by a decrease in the production of the hormone that regulates urine output (antidiuretic hormone), or the body's resistance to this hormone. This condition is not related to type 1 or type 2 diabetes mellitus. What are the causes? Central DI is caused by damage to the pituitary gland or hypothalamus in the brain. Dipsogenic DI is caused by a defect in the thirst mechanism in the brain. This defect causes you to drink too much fluid. These may result from: Brain surgery. Infection. Inflammation. Brain tumor. Head injury. Nephrogenic DI is caused by the kidneys not responding to the antidiuretic hormone in the body. This may result from: Chronic kidney disease (CKD). Certain medicines, such as lithium. Low potassium levels. High calcium levels. Gestational DI is rare and is caused by the antidiuretic hormone that has stopped working properly. What are the signs or symptoms? Symptoms of this condition include: Excessive urination. This means urinating more than 10 cups (2.4 L) during a period of 24 hours. Excessive thirst. Too much nighttime urination (nocturia). Nausea. Diarrhea. How is this diagnosed? This condition may be diagnosed based on: Your medical history. A physical exam. Blood tests. Urine tests. A water deprivation test. During this test, you will stop drinking fluids for a period of time and your blood and urine will be checked regularly. An MRI. How is this treated? Once your specific type of diabetes insipidus is diagnosed, treatment may include one or more of the following: Increasing or limiting your fluid intake. Taking medicines that contain artificial (synthetic) versions of the antidiuretic hormone. Stopping  certain medicines that you take. Correcting the balance of minerals (electrolytes) in your body. Changing your diet. You may be put on a low-protein and low-sodium diet. You may need to visit your health care provider regularly to make sure your condition is being treated properly. You may also need to work with providers who specialize in: Kidney problems (nephrologist). Hormone disorders (endocrinologist). Follow these instructions at home:  Eating and drinking Follow instructions from your health care provider about how much fluid and water to drink. You may be directed to drink more fluids and water, or to limit how much fluid and water you drink. Follow instructions from your health care provider about eating or drinking restrictions. General instructions Take over-the-counter and prescription medicines only as told by your health care provider. If directed, monitor your risk of dehydration in extreme heat. Carry a medical alert card or wear medical alert jewelry. Keep all follow-up visits as told by your health care provider. This is important. You may need to visit your health care provider regularly to make sure your condition is being treated properly. Contact a health care provider if: You continue to have symptoms after treatment.  Get help right away if: You have extreme thirst. You have symptoms of severe dehydration, such as rapid heart rate, muscle cramps, or confusion. Summary Diabetes insipidus (DI) is a rare condition that causes the body to produce more urine than normal, which leads to thirst and dehydration. Follow instructions from your health care provider about eating or drinking restrictions. Treatment may include increasing or limiting your fluid intake and correcting the balance of minerals (electrolytes) in your body. Get help right away if you have symptoms of severe dehydration, such as rapid heart rate, muscle cramps, or confusion. This information is not  intended to replace advice given to you by your health care provider. Make sure you discuss any questions you have with your health care provider. Document Revised: 03/21/2019 Document Reviewed: 03/21/2019 Elsevier Patient Education  [place].

## 2021-10-14 DIAGNOSIS — Z992 Dependence on renal dialysis: Secondary | ICD-10-CM | POA: Diagnosis not present

## 2021-10-14 DIAGNOSIS — N186 End stage renal disease: Secondary | ICD-10-CM | POA: Diagnosis not present

## 2021-10-16 DIAGNOSIS — Z992 Dependence on renal dialysis: Secondary | ICD-10-CM | POA: Diagnosis not present

## 2021-10-16 DIAGNOSIS — N186 End stage renal disease: Secondary | ICD-10-CM | POA: Diagnosis not present

## 2021-10-19 ENCOUNTER — Telehealth: Payer: Self-pay

## 2021-10-19 DIAGNOSIS — N186 End stage renal disease: Secondary | ICD-10-CM | POA: Diagnosis not present

## 2021-10-19 DIAGNOSIS — Z992 Dependence on renal dialysis: Secondary | ICD-10-CM | POA: Diagnosis not present

## 2021-10-19 NOTE — Chronic Care Management (AMB) (Signed)
  Chronic Care Management   Note  10/19/2021 Name: Lynnet Hefley MRN: [id number] DOB: 14-Jul-1980  Brytni Amburn is a 41 y.o. year old female who is a primary care patient of Ivy Lynn, NP. I reached out to [facility] by phone today in response to a referral sent by Ms. Elissa Lovett PCP.  Ms. Mcclard was given information about Chronic Care Management services today including:  CCM service includes personalized support from designated clinical staff supervised by her physician, including individualized plan of care and coordination with other care providers 24/7 contact phone numbers for assistance for urgent and routine care needs. Service will only be billed when office clinical staff spend 20 minutes or more in a month to coordinate care. Only one practitioner may furnish and bill the service in a calendar month. The patient may stop CCM services at any time (effective at the end of the month) by phone call to the office staff. The patient is responsible for co-pay (up to 20% after annual deductible is met) if co-pay is required by the individual health plan.   Patient agreed to services and verbal consent obtained.   Follow up plan: Telephone appointment with care management team member scheduled for:12/02/2021  Noreene Larsson, [place], [place] [address] Direct Dial: [phone] [email or web address]

## 2021-10-21 DIAGNOSIS — N186 End stage renal disease: Secondary | ICD-10-CM | POA: Diagnosis not present

## 2021-10-21 DIAGNOSIS — Z992 Dependence on renal dialysis: Secondary | ICD-10-CM | POA: Diagnosis not present

## 2021-10-23 DIAGNOSIS — N186 End stage renal disease: Secondary | ICD-10-CM | POA: Diagnosis not present

## 2021-10-23 DIAGNOSIS — Z992 Dependence on renal dialysis: Secondary | ICD-10-CM | POA: Diagnosis not present

## 2021-10-26 ENCOUNTER — Telehealth: Payer: Self-pay | Admitting: Nurse Practitioner

## 2021-10-26 DIAGNOSIS — N186 End stage renal disease: Secondary | ICD-10-CM | POA: Diagnosis not present

## 2021-10-26 DIAGNOSIS — Z992 Dependence on renal dialysis: Secondary | ICD-10-CM | POA: Diagnosis not present

## 2021-10-26 NOTE — Progress Notes (Unsigned)
VASCULAR AND VEIN SPECIALISTS OF [place] PROGRESS NOTE  ASSESSMENT / PLAN: Sally Porter is a 41 y.o. female s/p L brachiocephalic arteriovenous fistula creation 08/19/21.   SUBJECTIVE: ***  OBJECTIVE: LMP 09/09/2021 (Approximate)  @INTAKEOUTPUTBRIEF @  Urine output over past 24 hours: ***  Constitutional: *** appearing. *** acute distress. CNS: *** Cardiac: ***. Pulmonary: *** Abdomen: *** Vascular: ***     Latest Ref Rng & Units 08/20/2021    4:18 AM 08/19/2021    8:08 AM 08/17/2021    4:20 AM  CBC  WBC 4.0 - 10.5 K/uL 9.1   10.0   Hemoglobin 12.0 - 15.0 g/dL 7.7  8.5  8.5   Hematocrit 36.0 - 46.0 % 25.2  25.0  27.7   Platelets 150 - 400 K/uL 301   242         Latest Ref Rng & Units 08/20/2021    4:18 AM 08/19/2021    8:08 AM 08/18/2021    3:46 AM  CMP  Glucose 70 - 99 mg/dL 314   276   BUN 6 - 20 mg/dL 26   40   Creatinine 0.44 - 1.00 mg/dL 8.62   10.60   Sodium 135 - 145 mmol/L 130  131  132   Potassium 3.5 - 5.1 mmol/L 5.0  4.2  3.8   Chloride 98 - 111 mmol/L 96   95   CO2 22 - 32 mmol/L 23   22   Calcium 8.9 - 10.3 mg/dL 5.9   5.6     CrCl cannot be calculated (Patient's most recent lab result is older than the maximum 21 days allowed.).  ***  Yevonne Aline. Stanford Breed, MD [facility]ascular and Vein Specialists of [facility] Phone Number: [phone] 10/26/2021 3:44 PM

## 2021-10-26 NOTE — H&P (View-Only) (Signed)
VASCULAR AND VEIN SPECIALISTS OF [place] PROGRESS NOTE  ASSESSMENT / PLAN: Sally Porter is a 41 y.o. female s/p L brachiocephalic arteriovenous fistula creation 08/19/21.  The fistula has matured nicely.  There is good thrill proximally in the fistula.  It may be too deep for easy cannulation.  I have encouraged her to try using it at dialysis at her next session.  If her dialysis center has a hard time cannulating it, we can proceed with superficialization in the near future.  She knows to call if her dialysis center feels she needs superficialization.  SUBJECTIVE: No complaints.  No symptoms of steal syndrome.    OBJECTIVE: BP (!) 161/105 (BP Location: Right Arm, Patient Position: Sitting, Cuff Size: Large)   Pulse 66   Temp 98.2 F (36.8 C)   Resp 20   Ht 5\' 8"  (1.727 m)   Wt 249 lb (112.9 kg)   LMP 09/09/2021 (Approximate)   SpO2 97%   BMI 37.86 kg/m   Well-appearing woman in no acute distress Regular rate and rhythm Unlabored breathing Weakly palpable left radial pulse.  Easily palpable thrill in the left arm fistula.  The fistula is somewhat deep in the arm and is not visible.     Latest Ref Rng & Units 08/20/2021    4:18 AM 08/19/2021    8:08 AM 08/17/2021    4:20 AM  CBC  WBC 4.0 - 10.5 K/uL 9.1   10.0   Hemoglobin 12.0 - 15.0 g/dL 7.7  8.5  8.5   Hematocrit 36.0 - 46.0 % 25.2  25.0  27.7   Platelets 150 - 400 K/uL 301   242         Latest Ref Rng & Units 08/20/2021    4:18 AM 08/19/2021    8:08 AM 08/18/2021    3:46 AM  CMP  Glucose 70 - 99 mg/dL 314   276   BUN 6 - 20 mg/dL 26   40   Creatinine 0.44 - 1.00 mg/dL 8.62   10.60   Sodium 135 - 145 mmol/L 130  131  132   Potassium 3.5 - 5.1 mmol/L 5.0  4.2  3.8   Chloride 98 - 111 mmol/L 96   95   CO2 22 - 32 mmol/L 23   22   Calcium 8.9 - 10.3 mg/dL 5.9   5.6     Haydon Kalmar N. Stanford Breed, MD [facility] and Vein Specialists of [facility] Phone Number: [phone] 10/26/2021 3:44 PM

## 2021-10-26 NOTE — Telephone Encounter (Signed)
Is the dog a service dog? That letter may have to be from the county, but if it is just a regular letter to have a dog in an apartment that I can write.

## 2021-10-27 ENCOUNTER — Ambulatory Visit (HOSPITAL_COMMUNITY)
Admission: RE | Admit: 2021-10-27 | Discharge: 2021-10-27 | Disposition: A | Discharge status: Home / Self Care | Payer: Medicare Other | Source: Ambulatory Visit | Attending: Vascular Surgery | Admitting: Vascular Surgery

## 2021-10-27 ENCOUNTER — Encounter: Payer: Self-pay | Admitting: Vascular Surgery

## 2021-10-27 ENCOUNTER — Ambulatory Visit (INDEPENDENT_AMBULATORY_CARE_PROVIDER_SITE_OTHER): Payer: Medicare Other | Admitting: Vascular Surgery

## 2021-10-27 VITALS — BP 161/105 | HR 66 | Temp 98.2°F | Resp 20 | Ht 68.0 in | Wt 249.0 lb

## 2021-10-27 DIAGNOSIS — N186 End stage renal disease: Secondary | ICD-10-CM | POA: Diagnosis not present

## 2021-10-27 DIAGNOSIS — Z992 Dependence on renal dialysis: Secondary | ICD-10-CM

## 2021-10-27 NOTE — Telephone Encounter (Signed)
Patient said a regular letter from [place] would work for her. Please call back with any additional questions.

## 2021-10-28 ENCOUNTER — Telehealth: Payer: Self-pay

## 2021-10-28 ENCOUNTER — Other Ambulatory Visit: Payer: Self-pay

## 2021-10-28 DIAGNOSIS — Z992 Dependence on renal dialysis: Secondary | ICD-10-CM | POA: Diagnosis not present

## 2021-10-28 DIAGNOSIS — N186 End stage renal disease: Secondary | ICD-10-CM

## 2021-10-28 NOTE — Telephone Encounter (Signed)
Joy at [facility] called to report fistula was too deep when attempting to access today and requested patient to have a superficialization. When pt was seen yesterday, Dr. Stanford Breed advised, if her dialysis center has a hard time cannulating it, we can proceed with superficialization in the near future. Patient has been scheduled for 11/05/21. MD made aware.

## 2021-10-29 ENCOUNTER — Telehealth: Payer: Self-pay | Admitting: Nurse Practitioner

## 2021-10-29 DIAGNOSIS — Z992 Dependence on renal dialysis: Secondary | ICD-10-CM | POA: Diagnosis not present

## 2021-10-29 DIAGNOSIS — N186 End stage renal disease: Secondary | ICD-10-CM | POA: Diagnosis not present

## 2021-10-29 NOTE — Telephone Encounter (Signed)
Pt is asking if she should be taking both atenolol (TENORMIN) 50 MG tablet and hydrALAZINE (APRESOLINE) 50 MG tablet. Is it too much medicine? Please call back

## 2021-10-30 ENCOUNTER — Other Ambulatory Visit: Payer: Self-pay | Admitting: Nurse Practitioner

## 2021-10-30 DIAGNOSIS — Z23 Encounter for immunization: Secondary | ICD-10-CM | POA: Diagnosis not present

## 2021-10-30 DIAGNOSIS — N2581 Secondary hyperparathyroidism of renal origin: Secondary | ICD-10-CM | POA: Diagnosis not present

## 2021-10-30 DIAGNOSIS — Z992 Dependence on renal dialysis: Secondary | ICD-10-CM | POA: Diagnosis not present

## 2021-10-30 DIAGNOSIS — N186 End stage renal disease: Secondary | ICD-10-CM | POA: Diagnosis not present

## 2021-10-30 NOTE — Telephone Encounter (Signed)
Patient aware and verbalizes understanding. 

## 2021-10-30 NOTE — Telephone Encounter (Signed)
Aware and verbalizes understanding. Patient states she is at dialysis and she will call us back to give [place] some readings.

## 2021-10-30 NOTE — Telephone Encounter (Signed)
What is your blood pressure values as of today and the past few days? The last blood pressure reading I have is 161/105 for this values you will need the blood pressure medication on your medication list an prescribed. And keep monitoring you BP daily. Your goal is 130/80. Please let me know what questions you have.

## 2021-10-30 NOTE — Telephone Encounter (Signed)
I was told for service animal  patient would have to go to the county office

## 2021-11-02 DIAGNOSIS — Z23 Encounter for immunization: Secondary | ICD-10-CM | POA: Diagnosis not present

## 2021-11-02 DIAGNOSIS — Z992 Dependence on renal dialysis: Secondary | ICD-10-CM | POA: Diagnosis not present

## 2021-11-02 DIAGNOSIS — N2581 Secondary hyperparathyroidism of renal origin: Secondary | ICD-10-CM | POA: Diagnosis not present

## 2021-11-02 DIAGNOSIS — N186 End stage renal disease: Secondary | ICD-10-CM | POA: Diagnosis not present

## 2021-11-03 ENCOUNTER — Encounter (HOSPITAL_COMMUNITY): Payer: Self-pay | Admitting: Vascular Surgery

## 2021-11-03 ENCOUNTER — Other Ambulatory Visit: Payer: Self-pay

## 2021-11-03 ENCOUNTER — Telehealth: Payer: Self-pay

## 2021-11-03 NOTE — Telephone Encounter (Signed)
Pt called to let us know they had a difficult time at HD getting to her access and she is still quite bruised and swollen. Per MD, we have r/s her surgery 2 weeks further out. Pt is aware of new date/time. No further questions/concerns at this time.

## 2021-11-03 NOTE — Progress Notes (Signed)
PCP - [facility] Medicine Cardiologist - denies  PPM/ICD - denies  Chest x-ray - n/a EKG - will be done on the day of surgery Stress Test - denies ECHO - denies Cardiac Cath - denies  CPAP - n/a  Fasting Blood Sugar - 120 Checks Blood Sugar 3 times/day Patient mentioned that she had DM type I not type II  Blood Thinner Instructions: n/a Aspirin Instructions: Patient was instructed: As of today, STOP taking any Aspirin (unless otherwise instructed by your surgeon) Aleve, Naproxen, Ibuprofen, Motrin, Advil, Goody's, BC's, all herbal medications, fish oil, and all vitamins.  ERAS Protcol - n/a  COVID TEST- n/a  Anesthesia review: yes - recent hospitalization for Anemia (June 2023). Patient verbalized that her left arm is swollen and bruised; this writer instructed patient to call today Dr. Stanford Breed office and informed him about her symptoms. Patient verbalized understanding.   Patient verbally denies any shortness of breath, fever, cough and chest pain during phone call   -------------  SDW INSTRUCTIONS given:  Your procedure is scheduled on Thursday, September 7th, 2023.  Report to [facility] Main Entrance "A" at 07:15 A.M., and check in at the Admitting office.  Call this number if you have problems the morning of surgery:  [phone]   Remember:  Do not eat or drink after midnight the night before your surgery    Take these medicines the morning of surgery with A SIP OF WATER Xanax, Nortiptyline, Hydralazine, Oxycodone; PRN - inhaler - Please bring the inhaler with you the day of surgery.     THE NIGHT BEFORE SURGERY, take 28 units of insulin glargine (LANTUS SOLOSTAR)        THE MORNING OF SURGERY, hold insulin lispro (HUMALOG)   If your CBG is greater than 220 mg/dL, you may take  of your sliding scale (correction) dose of insulin.  How do I manage my blood sugar before surgery? Check your blood sugar at least 4 times a day, starting 2 days before  surgery, to make sure that the level is not too high or low.  Check your blood sugar the morning of your surgery when you wake up and every 2 hours until you get to the Short Stay unit.  If your blood sugar is less than 70 mg/dL, you will need to treat for low blood sugar: Do not take insulin. Treat a low blood sugar (less than 70 mg/dL) with  cup of clear juice (cranberry or apple), 4 glucose tablets, OR glucose gel. Recheck blood sugar in 15 minutes after treatment (to make sure it is greater than 70 mg/dL). If your blood sugar is not greater than 70 mg/dL on recheck, call [phone] for further instructions. Report your blood sugar to the short stay nurse when you get to Short Stay.   The day of surgery:                     Do not wear jewelry, make up, or nail polish            Do not wear lotions, powders, perfumes, or deodorant.            Do not shave 48 hours prior to surgery.              Do not bring valuables to the hospital.            [facility] is not responsible for any belongings or valuables.  Do NOT Smoke (Tobacco/Vaping) 24  hours prior to your procedure If you use a CPAP at night, you may bring all equipment for your overnight stay.   Contacts, glasses, dentures or bridgework may not be worn into surgery.      For patients admitted to the hospital, discharge time will be determined by your treatment team.   Patients discharged the day of surgery will not be allowed to drive home, and someone needs to stay with them for 24 hours.    Special instructions:   [facility]- Preparing For Surgery  Before surgery, you can play an important role. Because skin is not sterile, your skin needs to be as free of germs as possible. You can reduce the number of germs on your skin by washing with CHG (chlorahexidine gluconate) Soap before surgery.  CHG is an antiseptic cleaner which kills germs and bonds with the skin to continue killing germs even after washing.    Oral  Hygiene is also important to reduce your risk of infection.  Remember - BRUSH YOUR TEETH THE MORNING OF SURGERY WITH YOUR REGULAR TOOTHPASTE  Please do not use if you have an allergy to CHG or antibacterial soaps. If your skin becomes reddened/irritated stop using the CHG.  Do not shave (including legs and underarms) for at least 48 hours prior to first CHG shower. It is OK to shave your face.  Please follow these instructions carefully.   Shower the NIGHT BEFORE SURGERY and the MORNING OF SURGERY with DIAL Soap.   Pat yourself dry with a CLEAN TOWEL.  Wear CLEAN PAJAMAS to bed the night before surgery  Place CLEAN SHEETS on your bed the night of your first shower and DO NOT SLEEP WITH PETS.   Day of Surgery: Please shower morning of surgery  Wear Clean/Comfortable clothing the morning of surgery Do not apply any deodorants/lotions.   Remember to brush your teeth WITH YOUR REGULAR TOOTHPASTE.   Questions were answered. Patient verbalized understanding of instructions.

## 2021-11-04 ENCOUNTER — Encounter (HOSPITAL_COMMUNITY): Payer: Self-pay | Admitting: Vascular Surgery

## 2021-11-04 DIAGNOSIS — Z23 Encounter for immunization: Secondary | ICD-10-CM | POA: Diagnosis not present

## 2021-11-04 DIAGNOSIS — N186 End stage renal disease: Secondary | ICD-10-CM | POA: Diagnosis not present

## 2021-11-04 DIAGNOSIS — Z992 Dependence on renal dialysis: Secondary | ICD-10-CM | POA: Diagnosis not present

## 2021-11-04 DIAGNOSIS — N2581 Secondary hyperparathyroidism of renal origin: Secondary | ICD-10-CM | POA: Diagnosis not present

## 2021-11-04 NOTE — Progress Notes (Signed)
Anesthesia Chart Review:  Case: [id number] Date/Time: 11/19/21 0715   Procedure: LEFT ARM ARTERIOVENOUS FISTULA SUPERFICIALIZATION (Left) - PERIPHERAL NERVE BLOCK   Anesthesia type: Monitor Anesthesia Care   Pre-op diagnosis: ESRD   Location: [facility] OR ROOM 12 / [facility]   Surgeons: Cherre Robins, MD       DISCUSSION: Patient is a 41 year old female scheduled for the above procedure. Surgery moved from 11/05/21 to 11/19/21 due to reported bruising and swelling in her arm due to difficulty in obtaining acces for her dialysis treatment.   History includes never smoker, DM1 (diagnosed ~ age 41 years), HTN, anemia, HLD, glaucoma, ESRD (HD x ~ 3 weeks in 06/2017 in setting of groin cellulitis requiring I&D and AKI due to ATN; started urgent HD 08/15/21; HD [facility]). S/p creation of left brachiocephalic AVF 08/28/50.   Labs and EKG on arrival for surgery as indicated.  VS: LMP 10/20/2021 (Approximate)  BP Readings from Last 3 Encounters:  10/27/21 (!) 161/105  10/13/21 125/77  08/20/21 116/61   Pulse Readings from Last 3 Encounters:  10/27/21 66  10/13/21 64  08/20/21 88  Advised by her PCP to take both her atenolol and hydralazine as prescribed after 10/27/21 BP 161/105.    PROVIDERS: Ivy Lynn, NP is PCP ([facility]) Ulice Bold, MD is nephrologist. She has also been referred to [facility] for consideration of renal transplant evaluation. Last endocrinology visit seen was on 10/01/19 with Loni Beckwith, MD.   LABS: For day of surgery. A1c 10.0% on 08/14/21.    EKG: For day of surgery as indicated. No EKG seen within the past year.    CV: N/A  Past Medical History:  Diagnosis Date   Anemia    Anxiety    Depression    Diabetes mellitus without complication ([place])    Reported history of DM type 1   ESRD (end stage renal disease) ([place])    short term hemodialysis 06/2017; resumed HD 08/15/21   Glaucoma    Hyperlipidemia    Hypertension      Past Surgical History:  Procedure Laterality Date   AV FISTULA PLACEMENT Left 08/19/2021   Procedure: Creation of Brachial Cephalic LEFT ARM ARTERIOVENOUS (AV) FISTULA;  Surgeon: Cherre Robins, MD;  Location: [place] [facility];  Service: Vascular;  Laterality: Left;  PERIPHERAL NERVE BLOCK   IR FLUORO GUIDE CV LINE RIGHT  08/15/2021   IR US GUIDE VASC ACCESS RIGHT  08/15/2021    MEDICATIONS: No current facility-administered medications for this encounter.    albuterol (VENTOLIN HFA) 108 (90 Base) MCG/ACT inhaler   ALPRAZolam (XANAX) 0.5 MG tablet   atorvastatin (LIPITOR) 20 MG tablet   calcium acetate (PHOSLO) 667 MG capsule   ferrous sulfate 325 (65 FE) MG tablet   hydrALAZINE (APRESOLINE) 50 MG tablet   hydrocortisone cream 1 %   insulin glargine (LANTUS SOLOSTAR) 100 UNIT/ML Solostar Pen   insulin lispro (HUMALOG) 100 UNIT/ML injection   Multiple Vitamins-Minerals (ZINC PO)   nortriptyline (PAMELOR) 25 MG capsule   Oxycodone HCl 10 MG TABS   polyethylene glycol (MIRALAX / GLYCOLAX) 17 g packet   rOPINIRole (REQUIP) 0.5 MG tablet   senna-docusate (SENOKOT-S) 8.6-50 MG tablet   atenolol (TENORMIN) 50 MG tablet   calcitRIOL (ROCALTROL) 0.5 MCG capsule   Continuous Blood Gluc Sensor (FREESTYLE LIBRE 14 DAY SENSOR) MISC    Myra Gianotti, PA-C Surgical Short Stay/Anesthesiology [facility] Phone [phone] [facility] Phone [phone] 11/04/2021 12:06 PM

## 2021-11-06 DIAGNOSIS — N186 End stage renal disease: Secondary | ICD-10-CM | POA: Diagnosis not present

## 2021-11-06 DIAGNOSIS — N2581 Secondary hyperparathyroidism of renal origin: Secondary | ICD-10-CM | POA: Diagnosis not present

## 2021-11-06 DIAGNOSIS — Z23 Encounter for immunization: Secondary | ICD-10-CM | POA: Diagnosis not present

## 2021-11-06 DIAGNOSIS — Z992 Dependence on renal dialysis: Secondary | ICD-10-CM | POA: Diagnosis not present

## 2021-11-09 ENCOUNTER — Telehealth: Payer: Self-pay | Admitting: Nurse Practitioner

## 2021-11-09 DIAGNOSIS — N2581 Secondary hyperparathyroidism of renal origin: Secondary | ICD-10-CM | POA: Diagnosis not present

## 2021-11-09 DIAGNOSIS — Z23 Encounter for immunization: Secondary | ICD-10-CM | POA: Diagnosis not present

## 2021-11-09 DIAGNOSIS — Z992 Dependence on renal dialysis: Secondary | ICD-10-CM | POA: Diagnosis not present

## 2021-11-09 DIAGNOSIS — N186 End stage renal disease: Secondary | ICD-10-CM | POA: Diagnosis not present

## 2021-11-09 NOTE — Telephone Encounter (Signed)
Patient wanted [place] to know that [facility] is closing at the end of the year and wants to know if we would continue filling medication. I have called patient and spoken to her. I will put in new Neuro referral

## 2021-11-10 ENCOUNTER — Telehealth: Payer: Self-pay | Admitting: Nurse Practitioner

## 2021-11-10 ENCOUNTER — Other Ambulatory Visit: Payer: Self-pay | Admitting: Nurse Practitioner

## 2021-11-10 DIAGNOSIS — E1165 Type 2 diabetes mellitus with hyperglycemia: Secondary | ICD-10-CM

## 2021-11-10 MED ORDER — INSULIN LISPRO 100 UNIT/ML IJ SOLN: 0.0000 [IU] | Freq: Three times a day (TID) | INTRAMUSCULAR | 6 refills | Status: DC

## 2021-11-10 NOTE — Telephone Encounter (Signed)
Medication sent.

## 2021-11-10 NOTE — Telephone Encounter (Signed)
Aware by vm  

## 2021-11-10 NOTE — Telephone Encounter (Signed)
  Prescription Request  11/10/2021  Is this a "Controlled Substance" medicine? no  Have you seen your PCP in the last 2 weeks? Last seen on 10/13/2021, has 3 month f/u appt on 01/19/2022  If YES, route message to pool  -  If NO, patient needs to be scheduled for appointment.  What is the name of the medication or equipment? insulin lispro (HUMALOG) 100 UNIT/ML injection  Have you contacted your pharmacy to request a refill? Yes, pt is out of this medication   Which pharmacy would you like this sent to? [facility]   Patient notified that their request is being sent to the clinical staff for review and that they should receive a response within 2 business days.

## 2021-11-10 NOTE — Telephone Encounter (Signed)
Pt was prescribed rx in hospital- ok to send in?

## 2021-11-11 ENCOUNTER — Telehealth: Payer: Self-pay | Admitting: Nurse Practitioner

## 2021-11-11 ENCOUNTER — Other Ambulatory Visit: Payer: Self-pay | Admitting: Nurse Practitioner

## 2021-11-11 DIAGNOSIS — N2581 Secondary hyperparathyroidism of renal origin: Secondary | ICD-10-CM | POA: Diagnosis not present

## 2021-11-11 DIAGNOSIS — N186 End stage renal disease: Secondary | ICD-10-CM | POA: Diagnosis not present

## 2021-11-11 DIAGNOSIS — Z23 Encounter for immunization: Secondary | ICD-10-CM | POA: Diagnosis not present

## 2021-11-11 DIAGNOSIS — Z992 Dependence on renal dialysis: Secondary | ICD-10-CM | POA: Diagnosis not present

## 2021-11-11 MED ORDER — INSULIN LISPRO (1 UNIT DIAL) 100 UNIT/ML (KWIKPEN): 0.0000 [IU] | PEN_INJECTOR | Freq: Three times a day (TID) | SUBCUTANEOUS | 11 refills | Status: DC

## 2021-11-11 NOTE — Telephone Encounter (Signed)
Medication corrected and sent to pharmacy

## 2021-11-11 NOTE — Telephone Encounter (Signed)
Pt called to let PCP know that her Humalog Rx was sent in wrong. Pt says she uses pens but the pharmacy said it was sent in as valves so that's what they have to fill it as and pt does not use that.   Wants PCP to correct and resend.

## 2021-11-13 DIAGNOSIS — Z23 Encounter for immunization: Secondary | ICD-10-CM | POA: Diagnosis not present

## 2021-11-13 DIAGNOSIS — Z992 Dependence on renal dialysis: Secondary | ICD-10-CM | POA: Diagnosis not present

## 2021-11-13 DIAGNOSIS — N186 End stage renal disease: Secondary | ICD-10-CM | POA: Diagnosis not present

## 2021-11-13 DIAGNOSIS — N2581 Secondary hyperparathyroidism of renal origin: Secondary | ICD-10-CM | POA: Diagnosis not present

## 2021-11-16 DIAGNOSIS — Z992 Dependence on renal dialysis: Secondary | ICD-10-CM | POA: Diagnosis not present

## 2021-11-16 DIAGNOSIS — N2581 Secondary hyperparathyroidism of renal origin: Secondary | ICD-10-CM | POA: Diagnosis not present

## 2021-11-16 DIAGNOSIS — Z23 Encounter for immunization: Secondary | ICD-10-CM | POA: Diagnosis not present

## 2021-11-16 DIAGNOSIS — N186 End stage renal disease: Secondary | ICD-10-CM | POA: Diagnosis not present

## 2021-11-18 ENCOUNTER — Encounter (HOSPITAL_COMMUNITY): Payer: Self-pay | Admitting: Vascular Surgery

## 2021-11-18 ENCOUNTER — Other Ambulatory Visit: Payer: Self-pay

## 2021-11-18 DIAGNOSIS — N186 End stage renal disease: Secondary | ICD-10-CM | POA: Diagnosis not present

## 2021-11-18 DIAGNOSIS — Z23 Encounter for immunization: Secondary | ICD-10-CM | POA: Diagnosis not present

## 2021-11-18 DIAGNOSIS — N2581 Secondary hyperparathyroidism of renal origin: Secondary | ICD-10-CM | POA: Diagnosis not present

## 2021-11-18 DIAGNOSIS — Z992 Dependence on renal dialysis: Secondary | ICD-10-CM | POA: Diagnosis not present

## 2021-11-18 NOTE — Progress Notes (Addendum)
Pt given updated surgery time 0730-0912, arrival 0530. See pre-op call for medication lists. Pt sts no new changes from previous pre-op call on 11/03/21.

## 2021-11-18 NOTE — Anesthesia Preprocedure Evaluation (Addendum)
Anesthesia Evaluation  Patient identified by MRN, date of birth, ID band Patient awake    Reviewed: Allergy & Precautions, NPO status , Patient's Chart, lab work & pertinent test results  Airway Mallampati: III  TM Distance: >3 FB Neck ROM: Full    Dental no notable dental hx. (+) Dental Advisory Given   Pulmonary neg pulmonary ROS,    Pulmonary exam normal        Cardiovascular hypertension, Normal cardiovascular exam     Neuro/Psych PSYCHIATRIC DISORDERS Anxiety Depression negative neurological ROS     GI/Hepatic negative GI ROS, Neg liver ROS,   Endo/Other  diabetes  Renal/GU CRF and DialysisRenal disease     Musculoskeletal negative musculoskeletal ROS (+)   Abdominal   Peds  Hematology negative hematology ROS (+)   Anesthesia Other Findings   Reproductive/Obstetrics                           Anesthesia Physical Anesthesia Plan  ASA: 3  Anesthesia Plan: Regional and MAC   Post-op Pain Management: Tylenol PO (pre-op)*   Induction:   PONV Risk Score and Plan: 2  Airway Management Planned: Natural Airway  Additional Equipment:   Intra-op Plan:   Post-operative Plan:   Informed Consent: I have reviewed the patients History and Physical, chart, labs and discussed the procedure including the risks, benefits and alternatives for the proposed anesthesia with the patient or authorized representative who has indicated his/her understanding and acceptance.     Dental advisory given  Plan Discussed with: Anesthesiologist and CRNA  Anesthesia Plan Comments: (PAT note written by Shonna Chock, PA-C. )      Anesthesia Quick Evaluation

## 2021-11-19 ENCOUNTER — Ambulatory Visit (HOSPITAL_BASED_OUTPATIENT_CLINIC_OR_DEPARTMENT_OTHER): Payer: Medicare Other | Admitting: Vascular Surgery

## 2021-11-19 ENCOUNTER — Ambulatory Visit (HOSPITAL_COMMUNITY): Payer: Medicare Other | Admitting: Vascular Surgery

## 2021-11-19 ENCOUNTER — Other Ambulatory Visit: Payer: Self-pay

## 2021-11-19 ENCOUNTER — Ambulatory Visit (HOSPITAL_COMMUNITY)
Admission: RE | Admit: 2021-11-19 | Discharge: 2021-11-19 | Disposition: A | Discharge status: Home / Self Care | Payer: Medicare Other | Attending: Vascular Surgery | Admitting: Vascular Surgery

## 2021-11-19 ENCOUNTER — Encounter (HOSPITAL_COMMUNITY)
Admission: RE | Disposition: A | Discharge status: Home / Self Care | Payer: Self-pay | Source: Home / Self Care | Attending: Vascular Surgery

## 2021-11-19 DIAGNOSIS — Z794 Long term (current) use of insulin: Secondary | ICD-10-CM | POA: Diagnosis not present

## 2021-11-19 DIAGNOSIS — Z992 Dependence on renal dialysis: Secondary | ICD-10-CM

## 2021-11-19 DIAGNOSIS — I120 Hypertensive chronic kidney disease with stage 5 chronic kidney disease or end stage renal disease: Secondary | ICD-10-CM

## 2021-11-19 DIAGNOSIS — N186 End stage renal disease: Secondary | ICD-10-CM | POA: Diagnosis not present

## 2021-11-19 DIAGNOSIS — E1122 Type 2 diabetes mellitus with diabetic chronic kidney disease: Secondary | ICD-10-CM

## 2021-11-19 DIAGNOSIS — N185 Chronic kidney disease, stage 5: Secondary | ICD-10-CM | POA: Diagnosis not present

## 2021-11-19 HISTORY — DX: Anemia, unspecified: D64.9

## 2021-11-19 HISTORY — DX: Depression, unspecified: F32.A

## 2021-11-19 HISTORY — DX: Anxiety disorder, unspecified: F41.9

## 2021-11-19 HISTORY — DX: End stage renal disease: N18.6

## 2021-11-19 HISTORY — PX: FISTULA SUPERFICIALIZATION: SHX6341

## 2021-11-19 LAB — GLUCOSE, CAPILLARY
Glucose-Capillary: 168 mg/dL — ABNORMAL HIGH (ref 70–99)
Glucose-Capillary: 91 mg/dL (ref 70–99)

## 2021-11-19 LAB — POCT PREGNANCY, URINE: Preg Test, Ur: NEGATIVE

## 2021-11-19 SURGERY — FISTULA SUPERFICIALIZATION
Anesthesia: Monitor Anesthesia Care | Site: Arm Upper | Laterality: Left

## 2021-11-19 MED ORDER — AMISULPRIDE (ANTIEMETIC) 5 MG/2ML IV SOLN: 10.0000 mg | Freq: Once | INTRAVENOUS | Status: DC | PRN

## 2021-11-19 MED ORDER — CHLORHEXIDINE GLUCONATE 0.12 % MT SOLN
15.0000 mL | Freq: Once | OROMUCOSAL | Status: AC
  Administered 2021-11-19: 15 mL via OROMUCOSAL
  Filled 2021-11-19: qty 15

## 2021-11-19 MED ORDER — MIDAZOLAM HCL 2 MG/2ML IJ SOLN
INTRAMUSCULAR | Status: DC | PRN
  Administered 2021-11-19 (×2): 1 mg via INTRAVENOUS

## 2021-11-19 MED ORDER — ROPIVACAINE HCL 5 MG/ML IJ SOLN
INTRAMUSCULAR | Status: DC | PRN
  Administered 2021-11-19: 30 mL via EPIDURAL

## 2021-11-19 MED ORDER — PROPOFOL 500 MG/50ML IV EMUL
INTRAVENOUS | Status: DC | PRN
  Administered 2021-11-19: 100 ug/kg/min via INTRAVENOUS

## 2021-11-19 MED ORDER — CHLORHEXIDINE GLUCONATE 4 % EX LIQD: 60.0000 mL | Freq: Once | CUTANEOUS | Status: DC

## 2021-11-19 MED ORDER — SODIUM CHLORIDE 0.9 % IV SOLN: INTRAVENOUS | Status: DC

## 2021-11-19 MED ORDER — ACETAMINOPHEN 500 MG PO TABS
1000.0000 mg | ORAL_TABLET | Freq: Once | ORAL | Status: DC
  Filled 2021-11-19: qty 2

## 2021-11-19 MED ORDER — HEPARIN 6000 UNIT IRRIGATION SOLUTION
Status: AC
  Filled 2021-11-19: qty 500

## 2021-11-19 MED ORDER — DEXAMETHASONE SODIUM PHOSPHATE 10 MG/ML IJ SOLN
INTRAMUSCULAR | Status: DC | PRN
  Administered 2021-11-19: 5 mg

## 2021-11-19 MED ORDER — ORAL CARE MOUTH RINSE: 15.0000 mL | Freq: Once | OROMUCOSAL | Status: AC

## 2021-11-19 MED ORDER — PROMETHAZINE HCL 25 MG/ML IJ SOLN: 6.2500 mg | INTRAMUSCULAR | Status: DC | PRN

## 2021-11-19 MED ORDER — HEPARIN 6000 UNIT IRRIGATION SOLUTION
Status: DC | PRN
  Administered 2021-11-19: 1

## 2021-11-19 MED ORDER — CEFAZOLIN SODIUM-DEXTROSE 2-3 GM-%(50ML) IV SOLR
INTRAVENOUS | Status: DC | PRN
  Administered 2021-11-19: 2 g via INTRAVENOUS

## 2021-11-19 MED ORDER — PHENYLEPHRINE HCL-NACL 20-0.9 MG/250ML-% IV SOLN
INTRAVENOUS | Status: DC | PRN
  Administered 2021-11-19: 25 ug/min via INTRAVENOUS

## 2021-11-19 MED ORDER — FENTANYL CITRATE (PF) 250 MCG/5ML IJ SOLN
INTRAMUSCULAR | Status: AC
  Filled 2021-11-19: qty 5

## 2021-11-19 MED ORDER — FENTANYL CITRATE (PF) 100 MCG/2ML IJ SOLN: 25.0000 ug | INTRAMUSCULAR | Status: DC | PRN

## 2021-11-19 MED ORDER — FENTANYL CITRATE (PF) 250 MCG/5ML IJ SOLN
INTRAMUSCULAR | Status: DC | PRN
  Administered 2021-11-19 (×2): 25 ug via INTRAVENOUS

## 2021-11-19 MED ORDER — PROPOFOL 10 MG/ML IV BOLUS
INTRAVENOUS | Status: AC
  Filled 2021-11-19: qty 20

## 2021-11-19 MED ORDER — 0.9 % SODIUM CHLORIDE (POUR BTL) OPTIME
TOPICAL | Status: DC | PRN
  Administered 2021-11-19: 1000 mL

## 2021-11-19 MED ORDER — LIDOCAINE 2% (20 MG/ML) 5 ML SYRINGE
INTRAMUSCULAR | Status: DC | PRN
  Administered 2021-11-19: 20 mg via INTRAVENOUS

## 2021-11-19 MED ORDER — ONDANSETRON HCL 4 MG/2ML IJ SOLN
INTRAMUSCULAR | Status: DC | PRN
  Administered 2021-11-19: 4 mg via INTRAVENOUS

## 2021-11-19 MED ORDER — MIDAZOLAM HCL 2 MG/2ML IJ SOLN
INTRAMUSCULAR | Status: AC
  Filled 2021-11-19: qty 2

## 2021-11-19 MED ORDER — CEFAZOLIN SODIUM-DEXTROSE 2-4 GM/100ML-% IV SOLN
2.0000 g | INTRAVENOUS | Status: DC
  Filled 2021-11-19: qty 100

## 2021-11-19 SURGICAL SUPPLY — 35 items
ARMBAND PINK RESTRICT EXTREMIT (MISCELLANEOUS) ×1 IMPLANT
BAG COUNTER SPONGE SURGICOUNT (BAG) ×1 IMPLANT
BENZOIN TINCTURE PRP APPL 2/3 (GAUZE/BANDAGES/DRESSINGS) ×1 IMPLANT
CANISTER SUCT 3000ML PPV (MISCELLANEOUS) ×1 IMPLANT
CANNULA VESSEL 3MM 2 BLNT TIP (CANNULA) ×1 IMPLANT
CHLORAPREP W/TINT 26 (MISCELLANEOUS) ×1 IMPLANT
CLIP TI WIDE RED SMALL 6 (CLIP) IMPLANT
COUNTER NEEDLE 20 DBL MAG RED (NEEDLE) IMPLANT
COVER PROBE W GEL 5X96 (DRAPES) IMPLANT
DERMABOND ADVANCED .7 DNX6 (GAUZE/BANDAGES/DRESSINGS) IMPLANT
ELECT REM PT RETURN 9FT ADLT (ELECTROSURGICAL) ×1
ELECTRODE REM PT RTRN 9FT ADLT (ELECTROSURGICAL) ×1 IMPLANT
GLOVE BIO SURGEON STRL SZ7.5 (GLOVE) IMPLANT
GLOVE BIO SURGEON STRL SZ8 (GLOVE) ×1 IMPLANT
GOWN STRL REUS W/ TWL LRG LVL3 (GOWN DISPOSABLE) ×2 IMPLANT
GOWN STRL REUS W/ TWL XL LVL3 (GOWN DISPOSABLE) ×1 IMPLANT
GOWN STRL REUS W/TWL 2XL LVL3 (GOWN DISPOSABLE) IMPLANT
GOWN STRL REUS W/TWL LRG LVL3 (GOWN DISPOSABLE) ×2
GOWN STRL REUS W/TWL XL LVL3 (GOWN DISPOSABLE) ×1
KIT BASIN OR (CUSTOM PROCEDURE TRAY) ×1 IMPLANT
KIT TURNOVER KIT B (KITS) ×1 IMPLANT
NS IRRIG 1000ML POUR BTL (IV SOLUTION) ×1 IMPLANT
PACK CV ACCESS (CUSTOM PROCEDURE TRAY) ×1 IMPLANT
PAD ARMBOARD 7.5X6 YLW CONV (MISCELLANEOUS) ×2 IMPLANT
SLING ARM FOAM STRAP LRG (SOFTGOODS) IMPLANT
SLING ARM FOAM STRAP MED (SOFTGOODS) IMPLANT
STRIP CLOSURE SKIN 1/2X4 (GAUZE/BANDAGES/DRESSINGS) ×2 IMPLANT
SUT MNCRL AB 4-0 PS2 18 (SUTURE) ×1 IMPLANT
SUT PROLENE 6 0 BV (SUTURE) IMPLANT
SUT SILK 2 0 SH (SUTURE) IMPLANT
SUT VIC AB 3-0 SH 27 (SUTURE) ×3
SUT VIC AB 3-0 SH 27X BRD (SUTURE) ×1 IMPLANT
TOWEL GREEN STERILE (TOWEL DISPOSABLE) ×1 IMPLANT
UNDERPAD 30X36 HEAVY ABSORB (UNDERPADS AND DIAPERS) ×1 IMPLANT
WATER STERILE IRR 1000ML POUR (IV SOLUTION) ×1 IMPLANT

## 2021-11-19 NOTE — Op Note (Signed)
DATE OF SERVICE: 11/19/2021  PATIENT:  Sally Porter  41 y.o. female  PRE-OPERATIVE DIAGNOSIS:  ESRD  POST-OPERATIVE DIAGNOSIS:  Same  PROCEDURE:   Superficialization and side branch ligation of left arm brachiocephalic arteriovenous fistula  SURGEON:  Surgeon(s) and Role:    * Gala Padovano, Yevonne Aline, MD - Primary  ASSISTANT: Arlee Muslim, PA-C  An experienced assistant was required given the complexity of this procedure and the standard of surgical care. My assistant helped with exposure through counter tension, suctioning, ligation and retraction to better visualize the surgical field.  My assistant expedited sewing during the case by following my sutures. Wherever I use the term "we" in the report, my assistant actively helped me with that portion of the procedure.  ANESTHESIA:   regional and IV sedation  EBL: 134mL  BLOOD ADMINISTERED:none  DRAINS: none   LOCAL MEDICATIONS USED:  NONE  SPECIMEN:  none  COUNTS: confirmed correct.  TOURNIQUET:  none  PATIENT DISPOSITION:  PACU - hemodynamically stable.   Delay start of Pharmacological VTE agent (>24hrs) due to surgical blood loss or risk of bleeding: no  INDICATION FOR PROCEDURE: Sally Porter is a 41 y.o. female with End-stage renal disease, for whom I created a brachiocephalic arteriovenous fistula on 08/19/2021.  The fistula has matured appropriately, but is too deep to cannulate. After careful discussion of risks, benefits, and alternatives the patient was offered superficialization. The patient understood and wished to proceed.  OPERATIVE FINDINGS: Healthy, matured fistula, quite deep in the arm.  Fistula mobilized through 3 skip incisions.  Fistula tunneled subcutaneously medial to these incisions.  Good thrill at completion.  This was confirmed with Doppler.  Good Doppler signal at the radial artery at completion.  DESCRIPTION OF PROCEDURE: After identification of the patient in the pre-operative holding area, the  patient was transferred to the operating room. The patient was positioned supine on the operating room table. Anesthesia was induced. The left arm was prepped and draped in standard fashion. A surgical pause was performed confirming correct patient, procedure, and operative location.  Using intraoperative ultrasound, the course of the left brachiocephalic arteriovenous fistula was mapped on the skin.  3 skip incisions were made over the course of the upper arm over the fistula.  The incisions were carried down through subcutaneous tissue until the fistula was encountered.  The fistula was skeletonized from the distal arm to the shoulder.  Several small side branches were identified, ligated with silk suture, and divided.  Once the fistula was completely mobilized from the surrounding soft tissue attachments a straight Kelly-Wick tunneler was delivered medially in the arm in a subcutaneous position to bring the fistula up closer to the skin.  Fistula clamps were applied proximally and distally to the fistula.  The fistula was divided.  The fistula was pressurized with heparinized saline.  The fistula was brought through the tunnel using a aortic cross-clamp with great care to avoid twisting or kinking the fistula.  The central clamp was then transitioned to the proximal incision.  The fistula was reanastomosed to itself in an end-to-end configuration using continuous running suture of 6-0 Prolene.  Prior to completion the central clamp was released and venous flow used to flush the anastomosis.  The inflow clamp was then released.  The fistula was evaluated with palpation and Doppler machine.  Good thrill was felt throughout.  Good Doppler bruit was heard throughout.  Radial artery signal was heard at the wrist.  Satisfied end of the case here.  The  wound was closed in layers using 3-0 Vicryl and 4-0 Monocryl.  Dermabond was applied to the incisions.  Upon completion of the case instrument and sharps counts were  confirmed correct. The patient was transferred to the PACU in good condition. I was present for all portions of the procedure.  Yevonne Aline. Stanford Breed, MD [facility] of [facility] Phone Number: [phone] 11/19/2021 9:03 AM

## 2021-11-19 NOTE — Anesthesia Procedure Notes (Signed)
Procedure Name: MAC Date/Time: 11/19/2021 7:52 AM  Performed by: Lavell Luster, CRNAPre-anesthesia Checklist: Patient identified, Emergency Drugs available, Suction available, Patient being monitored and Timeout performed Patient Re-evaluated:Patient Re-evaluated prior to induction Oxygen Delivery Method: Nasal cannula and Simple face mask Preoxygenation: Pre-oxygenation with 100% oxygen Induction Type: IV induction Placement Confirmation: breath sounds checked- equal and bilateral and positive ETCO2 Dental Injury: Teeth and Oropharynx as per pre-operative assessment

## 2021-11-19 NOTE — Progress Notes (Signed)
CBG 168 @ 0545.  Patient took fast acting insulin @ 0400 on 11/19/21.  Spoke to Dr. Tobias Alexander regarding insulin protocol.  Will not order protocol at this time. Will continue to monitor patient's blood sugars.

## 2021-11-19 NOTE — Transfer of Care (Signed)
Immediate Anesthesia Transfer of Care Note  Patient: Wakeelah Herberger  Procedure(s) Performed: LEFT ARM ARTERIOVENOUS FISTULA SUPERFICIALIZATION AND SIDE BRANCH LIGATION (Left: Arm Upper)  Patient Location: PACU  Anesthesia Type:MAC  Level of Consciousness: awake, alert  and oriented  Airway & Oxygen Therapy: Patient connected to nasal cannula oxygen  Post-op Assessment: Post -op Vital signs reviewed and stable  Post vital signs: stable  Last Vitals:  Vitals Value Taken Time  BP    Temp    Pulse 86 11/19/21 0918  Resp 16 11/19/21 0918  SpO2 100 % 11/19/21 0918  Vitals shown include unvalidated device data.  Last Pain:  Vitals:   11/19/21 0624  TempSrc:   PainSc: 0-No pain      Patients Stated Pain Goal: 3 (76/81/15 7262)  Complications: No notable events documented.

## 2021-11-19 NOTE — Progress Notes (Signed)
Orthopedic Tech Progress Note Patient Details:  Sally Porter 1980-09-20 [id number]  Ortho Devices Type of Ortho Device: Arm sling Ortho Device/Splint Location: LUE Ortho Device/Splint Interventions: Ordered, Application, Adjustment   Post Interventions Patient Tolerated: Well Instructions Provided: Care of device  Arville Go 11/19/2021, 9:59 AM

## 2021-11-19 NOTE — Interval H&P Note (Signed)
History and Physical Interval Note:  11/19/2021 7:31 AM  Sally Porter  has presented today for surgery, with the diagnosis of ESRD.  The various methods of treatment have been discussed with the patient and family. After consideration of risks, benefits and other options for treatment, the patient has consented to  Procedure(s) with comments: LEFT ARM ARTERIOVENOUS FISTULA SUPERFICIALIZATION (Left) - PERIPHERAL NERVE BLOCK as a surgical intervention.  The patient's history has been reviewed, patient examined, no change in status, stable for surgery.  I have reviewed the patient's chart and labs.  Questions were answered to the patient's satisfaction.     Cherre Robins

## 2021-11-19 NOTE — Anesthesia Procedure Notes (Signed)
Anesthesia Regional Block: Interscalene brachial plexus block   Pre-Anesthetic Checklist: , timeout performed,  Correct Patient, Correct Site, Correct Laterality,  Correct Procedure, Correct Position, site marked,  Risks and benefits discussed,  Surgical consent,  Pre-op evaluation,  At surgeon's request and post-op pain management  Laterality: Left  Prep: chloraprep       Needles:  Injection technique: Single-shot  Needle Type: Echogenic Stimulator Needle     Needle Length: 5cm  Needle Gauge: 22     Additional Needles:   Narrative:  Start time: 11/19/2021 7:01 AM End time: 11/19/2021 7:11 AM Injection made incrementally with aspirations every 5 mL.  Performed by: Personally  Anesthesiologist: Duane Boston, MD  Additional Notes: Functioning IV was confirmed and monitors applied.  A 40mm 22ga echogenic arrow stimulator was used. Sterile prep and drape,hand hygiene and sterile gloves were used.Ultrasound guidance: relevant anatomy identified, needle position confirmed, local anesthetic spread visualized around nerve(s)., vascular puncture avoided.  Image printed for medical record.  Negative aspiration and negative test dose prior to incremental administration of local anesthetic. The patient tolerated the procedure well.

## 2021-11-19 NOTE — Anesthesia Postprocedure Evaluation (Signed)
Anesthesia Post Note  Patient: Sally Porter  Procedure(s) Performed: LEFT ARM ARTERIOVENOUS FISTULA SUPERFICIALIZATION AND SIDE BRANCH LIGATION (Left: Arm Upper)     Patient location during evaluation: PACU Anesthesia Type: Regional Level of consciousness: awake and alert Pain management: pain level controlled Vital Signs Assessment: post-procedure vital signs reviewed and stable Respiratory status: spontaneous breathing and respiratory function stable Cardiovascular status: stable Postop Assessment: no apparent nausea or vomiting Anesthetic complications: no   No notable events documented.  Last Vitals:  Vitals:   11/19/21 0930 11/19/21 0945  BP: (!) 162/82 (!) 163/93  Pulse: 87 86  Resp: 18 19  Temp:  36.6 C  SpO2: 98% 96%    Last Pain:  Vitals:   11/19/21 0945  TempSrc:   PainSc: 0-No pain                 Jahdiel Krol DANIEL

## 2021-11-20 ENCOUNTER — Encounter (HOSPITAL_COMMUNITY): Payer: Self-pay | Admitting: Vascular Surgery

## 2021-11-20 DIAGNOSIS — Z23 Encounter for immunization: Secondary | ICD-10-CM | POA: Diagnosis not present

## 2021-11-20 DIAGNOSIS — N2581 Secondary hyperparathyroidism of renal origin: Secondary | ICD-10-CM | POA: Diagnosis not present

## 2021-11-20 DIAGNOSIS — Z992 Dependence on renal dialysis: Secondary | ICD-10-CM | POA: Diagnosis not present

## 2021-11-20 DIAGNOSIS — N186 End stage renal disease: Secondary | ICD-10-CM | POA: Diagnosis not present

## 2021-11-23 DIAGNOSIS — E785 Hyperlipidemia, unspecified: Secondary | ICD-10-CM | POA: Diagnosis not present

## 2021-11-23 DIAGNOSIS — Z794 Long term (current) use of insulin: Secondary | ICD-10-CM | POA: Diagnosis not present

## 2021-11-23 DIAGNOSIS — E119 Type 2 diabetes mellitus without complications: Secondary | ICD-10-CM | POA: Diagnosis not present

## 2021-11-23 DIAGNOSIS — Z23 Encounter for immunization: Secondary | ICD-10-CM | POA: Diagnosis not present

## 2021-11-23 DIAGNOSIS — N186 End stage renal disease: Secondary | ICD-10-CM | POA: Diagnosis not present

## 2021-11-23 DIAGNOSIS — N2581 Secondary hyperparathyroidism of renal origin: Secondary | ICD-10-CM | POA: Diagnosis not present

## 2021-11-23 DIAGNOSIS — Z992 Dependence on renal dialysis: Secondary | ICD-10-CM | POA: Diagnosis not present

## 2021-11-23 LAB — POCT I-STAT, CHEM 8
BUN: 28 mg/dL — ABNORMAL HIGH (ref 6–20)
Calcium, Ion: 0.97 mmol/L — ABNORMAL LOW (ref 1.15–1.40)
Chloride: 101 mmol/L (ref 98–111)
Creatinine, Ser: 6.5 mg/dL — ABNORMAL HIGH (ref 0.44–1.00)
Glucose, Bld: 136 mg/dL — ABNORMAL HIGH (ref 70–99)
HCT: 33 % — ABNORMAL LOW (ref 36.0–46.0)
Hemoglobin: 11.2 g/dL — ABNORMAL LOW (ref 12.0–15.0)
Potassium: 4 mmol/L (ref 3.5–5.1)
Sodium: 140 mmol/L (ref 135–145)
TCO2: 31 mmol/L (ref 22–32)

## 2021-11-25 DIAGNOSIS — Z992 Dependence on renal dialysis: Secondary | ICD-10-CM | POA: Diagnosis not present

## 2021-11-25 DIAGNOSIS — N2581 Secondary hyperparathyroidism of renal origin: Secondary | ICD-10-CM | POA: Diagnosis not present

## 2021-11-25 DIAGNOSIS — N186 End stage renal disease: Secondary | ICD-10-CM | POA: Diagnosis not present

## 2021-11-25 DIAGNOSIS — Z23 Encounter for immunization: Secondary | ICD-10-CM | POA: Diagnosis not present

## 2021-11-27 DIAGNOSIS — Z992 Dependence on renal dialysis: Secondary | ICD-10-CM | POA: Diagnosis not present

## 2021-11-27 DIAGNOSIS — N186 End stage renal disease: Secondary | ICD-10-CM | POA: Diagnosis not present

## 2021-11-27 DIAGNOSIS — N2581 Secondary hyperparathyroidism of renal origin: Secondary | ICD-10-CM | POA: Diagnosis not present

## 2021-11-27 DIAGNOSIS — Z23 Encounter for immunization: Secondary | ICD-10-CM | POA: Diagnosis not present

## 2021-11-28 DIAGNOSIS — Z992 Dependence on renal dialysis: Secondary | ICD-10-CM | POA: Diagnosis not present

## 2021-11-28 DIAGNOSIS — N186 End stage renal disease: Secondary | ICD-10-CM | POA: Diagnosis not present

## 2021-11-30 DIAGNOSIS — Z23 Encounter for immunization: Secondary | ICD-10-CM | POA: Diagnosis not present

## 2021-11-30 DIAGNOSIS — N2581 Secondary hyperparathyroidism of renal origin: Secondary | ICD-10-CM | POA: Diagnosis not present

## 2021-11-30 DIAGNOSIS — N186 End stage renal disease: Secondary | ICD-10-CM | POA: Diagnosis not present

## 2021-11-30 DIAGNOSIS — Z992 Dependence on renal dialysis: Secondary | ICD-10-CM | POA: Diagnosis not present

## 2021-12-02 ENCOUNTER — Ambulatory Visit (INDEPENDENT_AMBULATORY_CARE_PROVIDER_SITE_OTHER): Payer: Medicare Other | Admitting: Pharmacist

## 2021-12-02 DIAGNOSIS — N185 Chronic kidney disease, stage 5: Secondary | ICD-10-CM

## 2021-12-02 DIAGNOSIS — Z992 Dependence on renal dialysis: Secondary | ICD-10-CM | POA: Diagnosis not present

## 2021-12-02 DIAGNOSIS — N2581 Secondary hyperparathyroidism of renal origin: Secondary | ICD-10-CM | POA: Diagnosis not present

## 2021-12-02 DIAGNOSIS — N186 End stage renal disease: Secondary | ICD-10-CM | POA: Diagnosis not present

## 2021-12-02 DIAGNOSIS — E782 Mixed hyperlipidemia: Secondary | ICD-10-CM

## 2021-12-02 DIAGNOSIS — Z23 Encounter for immunization: Secondary | ICD-10-CM | POA: Diagnosis not present

## 2021-12-02 MED ORDER — DEXCOM G7 SENSOR MISC: 3 refills | Status: DC

## 2021-12-02 NOTE — Progress Notes (Signed)
Chronic Care Management Pharmacy Note  12/02/2021 Name:  Sally Porter MRN:  [id number] DOB:  December 18, 1980  Summary:  Diabetes: New goal. Uncontrolled; current treatment: basal/bolus--patient is ESRD on HD MWF  Patient's most recent A1c in 10% in June 2023 Requesting Dexcom G7 CGM Will send to [place] per patient request May need to go DME--patient to communicate to [place] if unable to get Patient to use her smartphone as reader  New G7 Sample left up front for patient to pick up Education provided on CGM Current glucose readings: fasting glucose: 200, post prandial glucose: >200 Denies/reports hypoglycemic/hyperglycemic symptoms Current meal patterns: Must follow renal/diabetes dietary recommendations Current exercise: N/A; ESRD HD MWF Educated on NEW DEXCOM G7--APPLY SENSOR TO ARM EVERY 10 DAYS; WILL USE SMART PHONE AS READER Plan to adjust regimen based on new CGM readings Reviewed medications for hypertension and hyperlipidemia   Patient Goals/Self-Care Activities patient will:  - take medications as prescribed as evidenced by patient report and record review check glucose utilizing new dexcom g7 cgm, document, and provide at future appointments target a minimum of 150 minutes of moderate intensity exercise weekly engage in dietary modifications by renal/diabetes dietary recommendations   Subjective: Sally Porter is an 41 y.o. year old female who is a primary patient of Ivy Lynn, NP.  The CCM team was consulted for assistance with disease management and care coordination needs.    Engaged with patient by telephone for initial visit in response to provider referral for pharmacy case management and/or care coordination services.   Consent to Services:  The patient was given information about Chronic Care Management services, agreed to services, and gave verbal consent prior to initiation of services.  Please see initial visit note for detailed documentation.    Patient Care Team: Ivy Lynn, NP as PCP - General (Nurse Practitioner) Lavera Guise, [facility] as Pharmacist (Family Medicine) [place], [facility]  Objective:  Lab Results  Component Value Date   CREATININE 6.50 (H) 11/19/2021   CREATININE 8.62 (H) 08/20/2021   CREATININE 10.60 (H) 08/18/2021    Lab Results  Component Value Date   HGBA1C 10.0 (H) 08/14/2021       Component Value Date/Time   CHOL 200 (H) 08/10/2018 1013   TRIG 156 (H) 08/10/2018 1013   HDL 51 08/10/2018 1013   CHOLHDL 3.9 08/10/2018 1013   LDLCALC 122 (H) 08/10/2018 1013       Latest Ref Rng & Units 08/15/2021    3:16 AM 08/14/2021    1:31 PM 08/10/2018   10:13 AM  Hepatic Function  Total Protein 6.1 - 8.1 g/dL   7.2   Albumin 3.5 - 5.0 g/dL 2.5  2.7    AST 10 - 30 U/L   17   ALT 6 - 29 U/L   9   Total Bilirubin 0.2 - 1.2 mg/dL   0.2     Lab Results  Component Value Date/Time   TSH 2.98 04/27/2019 12:00 AM   TSH 3.75 08/10/2018 10:13 AM   FREET4 0.8 08/10/2018 10:13 AM       Latest Ref Rng & Units 11/19/2021    6:28 AM 08/20/2021    4:18 AM 08/19/2021    8:08 AM  CBC  WBC 4.0 - 10.5 K/uL  9.1    Hemoglobin 12.0 - 15.0 g/dL 11.2  7.7  8.5   Hematocrit 36.0 - 46.0 % 33.0  25.2  25.0   Platelets 150 - 400  K/uL  301      Lab Results  Component Value Date/Time   VD25OH 9.17 (L) 08/15/2021 04:00 PM   VD25OH 4.6 04/27/2019 12:00 AM    Clinical ASCVD: No  The ASCVD Risk score (Arnett DK, et al., 2019) failed to calculate for the following reasons:   The valid total cholesterol range is 130 to 320 mg/dL    Other: (CHADS2VASc if Afib, PHQ9 if depression, MMRC or CAT for COPD, ACT, DEXA)  Social History   Tobacco Use  Smoking Status Never  Smokeless Tobacco Never   BP Readings from Last 3 Encounters:  11/19/21 (!) 163/93  10/27/21 (!) 161/105  10/13/21 125/77   Pulse Readings from Last 3 Encounters:  11/19/21 86  10/27/21 66  10/13/21 64   Wt  Readings from Last 3 Encounters:  11/19/21 242 lb (109.8 kg)  10/27/21 249 lb (112.9 kg)  10/13/21 243 lb (110.2 kg)    Assessment: Review of patient past medical history, allergies, medications, health status, including review of consultants reports, laboratory and other test data, was performed as part of comprehensive evaluation and provision of chronic care management services.   SDOH:  (Social Determinants of Health) assessments and interventions performed:    CCM Care Plan  Allergies  Allergen Reactions   Tylenol [Acetaminophen] Other (See Comments)    Patient stated she does not take due to her kidney function.   Dilaudid [Hydromorphone] Itching   Lyrica [Pregabalin] Itching   Morphine And Related Itching    Medications Reviewed Today     Reviewed by Lavera Guise, [facility] (Pharmacist) on 12/10/21 at 1541  Med List Status: <None>   Medication Order Taking? Sig Documenting Provider Last Dose Status Informant  albuterol (VENTOLIN HFA) 108 (90 Base) MCG/ACT inhaler [id number] No Inhale 2 puffs into the lungs every 6 (six) hours as needed for wheezing or shortness of breath. [provider] Past Month Active Self  ALPRAZolam (XANAX) 0.5 MG tablet [id number] No Take 0.5 mg by mouth 2 (two) times daily. [provider] 11/18/2021 Active Self  atenolol (TENORMIN) 50 MG tablet [id number] No Take 1 tablet (50 mg total) by mouth daily.  Patient not taking: Reported on 10/29/2021   Ivy Lynn, NP Not Taking Active Self  atorvastatin (LIPITOR) 20 MG tablet [id number] No Take 1 tablet (20 mg total) by mouth daily at 6 PM. Ivy Lynn, NP 11/18/2021 Active Self  calcitRIOL (ROCALTROL) 0.5 MCG capsule [id number] No TAKE ONE CAPSULE BY MOUTH DAILY Ivy Lynn, NP 11/18/2021 Active Self           Med Note Dina Rich, JEANNETTA   Thu Oct 29, 2021 10:53 AM) Given at dialysys  calcium acetate (PHOSLO) 667 MG capsule [id number] No TAKE TWO CAPSULES BY MOUTH THREE TIMES DAILY  WITH meals  Patient taking differently: Take 1,334 mg by mouth 3 (three) times daily with meals.   Ivy Lynn, NP 11/18/2021 Active Self  Continuous Blood Gluc Sensor ([place]) MISC [id number] Yes Apply to arm every 10 days to test blood sugar continuously. DX: E11.65 Ivy Lynn, NP  Active   ferrous sulfate 325 (65 FE) MG tablet [id number] No Take 325 mg by mouth daily with breakfast. [provider] 11/18/2021 Active Self  hydrALAZINE (APRESOLINE) 50 MG tablet [id number] No TAKE 1 TABLET BY MOUTH EVERY 8 HOURS Ivy Lynn, NP 11/19/2021 0400 Active Self  hydrocortisone cream 1 % [id number] No Apply 1 Application topically daily. [provider] Past  Week Active Self  insulin glargine (LANTUS SOLOSTAR) 100 UNIT/ML Solostar Pen [id number] No Inject 35 Units into the skin at bedtime. Shelly Coss, MD 11/18/2021 Active Self  insulin lispro (HUMALOG KWIKPEN) 100 UNIT/ML KwikPen [id number] No Inject 0-15 Units into the skin 3 (three) times daily. Sliding scale Ivy Lynn, NP 11/19/2021 0400 Active            Med Note Sander Nephew, GINGER E   Thu Nov 19, 2021  6:18 AM) 6 units   Multiple Vitamins-Minerals (ZINC PO) [id number] No Take by mouth. [provider] 11/18/2021 Active Self  nortriptyline (PAMELOR) 25 MG capsule [id number] No Take 25 mg by mouth 2 (two) times daily. [provider] 11/18/2021 Active Self  Oxycodone HCl 10 MG TABS [id number] No Take 10 mg by mouth every 6 (six) hours. [provider] 11/18/2021 Active Self  polyethylene glycol (MIRALAX / GLYCOLAX) 17 g packet [id number] No Take 17 g by mouth daily.  Patient taking differently: Take 17 g by mouth daily as needed for mild constipation or moderate constipation.   Shelly Coss, MD More than a month Active Self  rOPINIRole (REQUIP) 0.5 MG tablet [id number] No Take 0.5 mg by mouth at bedtime. [provider] 11/18/2021 Active Self  senna-docusate (SENOKOT-S) 8.6-50 MG  tablet [id number] No Take 1 tablet by mouth 2 (two) times daily.  Patient taking differently: Take 1 tablet by mouth at bedtime as needed for mild constipation or moderate constipation.   Shelly Coss, MD Taking Active Self  Med List Note Jeannette How 10/29/21 1053): Dialysis M-W-F            Patient Active Problem List   Diagnosis Date Noted   Acute renal failure superimposed on stage 4 chronic kidney disease ([place]) 08/14/2021   Intractable nausea and vomiting 08/14/2021   Obesity, Class III, BMI 40-49.9 (morbid obesity) ([place]) 08/14/2021   Opioid dependence ([place]) 08/14/2021   Uncontrolled type 2 diabetes mellitus with hyperglycemia, with long-term current use of insulin ([place]) 08/14/2021   Type 1 diabetes mellitus with stage 5 chronic kidney disease not on chronic dialysis ([place]) 05/01/2019   Mixed hyperlipidemia 05/01/2019   Essential hypertension, benign 05/01/2019   Vitamin D deficiency 05/01/2019   Personal history of noncompliance with medical treatment, presenting hazards to health 05/01/2019   Hyperkalemia 05/01/2019    Immunization History  Administered Date(s) Administered   PPD Test 08/18/2021   Unspecified SARS-COV-2 Vaccination 12/24/2019    Conditions to be addressed/monitored: HTN, HLD, DMII, and ESRD  Care Plan : PHARMD MEDICATION MANAGEMENT  Updates made by Lavera Guise, [place] since 12/10/2021 12:00 AM     Problem: DISEASE PROGRESSION PREVENTION      Long-Range Goal: DIABETES, HTN, HLD PHARMD GOALS   Note:   Current Barriers:  Unable to independently monitor therapeutic efficacy  Pharmacist Clinical Goal(s):  patient will achieve adherence to monitoring guidelines and medication adherence to achieve therapeutic efficacy through collaboration with PharmD and provider.    Interventions: 1:1 collaboration with Ivy Lynn, NP regarding development and update of comprehensive plan of care as evidenced by provider attestation and  co-signature Inter-disciplinary care team collaboration (see longitudinal plan of care) Comprehensive medication review performed; medication list updated in electronic medical record  Diabetes: New goal. Uncontrolled; current treatment: basal/bolus--patient is ESRD on HD MWF  Patient's most recent A1c in 10% in June 2023 Requesting Dexcom G7 CGM Will attempt to send to [facility] drug per patient request May need to go DME Patient to  use her smartphone as reader  New G7 Sample left up front for patient to pick up Current glucose readings: fasting glucose: 200, post prandial glucose: >200 Denies/reports hypoglycemic/hyperglycemic symptoms Current meal patterns: Must follow renal/diabetes dietary recommendations Current exercise: N/A; ESRD HD MWF Educated on NEW DEXCOM G7--APPLY SENSOR TO ARM EVERY 10 DAYS; WILL USE SMART PHONE AS READER Plan to adjust regimen based on new CGM readings Reviewed medications for hypertension and hyperlipidemia   Patient Goals/Self-Care Activities patient will:  - take medications as prescribed as evidenced by patient report and record review check glucose utilizing new dexcom g7 cgm, document, and provide at future appointments target a minimum of 150 minutes of moderate intensity exercise weekly engage in dietary modifications by renal/diabetes dietary recommendations      Follow Up:  Patient agrees to Care Plan and Follow-up.  Plan: Telephone follow up appointment with care management team member scheduled for:  1 month    Regina Eck, PharmD, BCPS Clinical Pharmacist, [place]  II Phone [phone]

## 2021-12-04 DIAGNOSIS — Z23 Encounter for immunization: Secondary | ICD-10-CM | POA: Diagnosis not present

## 2021-12-04 DIAGNOSIS — N186 End stage renal disease: Secondary | ICD-10-CM | POA: Diagnosis not present

## 2021-12-04 DIAGNOSIS — Z992 Dependence on renal dialysis: Secondary | ICD-10-CM | POA: Diagnosis not present

## 2021-12-04 DIAGNOSIS — N2581 Secondary hyperparathyroidism of renal origin: Secondary | ICD-10-CM | POA: Diagnosis not present

## 2021-12-07 DIAGNOSIS — Z23 Encounter for immunization: Secondary | ICD-10-CM | POA: Diagnosis not present

## 2021-12-07 DIAGNOSIS — N186 End stage renal disease: Secondary | ICD-10-CM | POA: Diagnosis not present

## 2021-12-07 DIAGNOSIS — N2581 Secondary hyperparathyroidism of renal origin: Secondary | ICD-10-CM | POA: Diagnosis not present

## 2021-12-07 DIAGNOSIS — Z992 Dependence on renal dialysis: Secondary | ICD-10-CM | POA: Diagnosis not present

## 2021-12-08 DIAGNOSIS — G4700 Insomnia, unspecified: Secondary | ICD-10-CM | POA: Diagnosis not present

## 2021-12-08 DIAGNOSIS — G2581 Restless legs syndrome: Secondary | ICD-10-CM | POA: Diagnosis not present

## 2021-12-08 DIAGNOSIS — M545 Low back pain, unspecified: Secondary | ICD-10-CM | POA: Diagnosis not present

## 2021-12-08 DIAGNOSIS — Z79891 Long term (current) use of opiate analgesic: Secondary | ICD-10-CM | POA: Diagnosis not present

## 2021-12-08 DIAGNOSIS — M79672 Pain in left foot: Secondary | ICD-10-CM | POA: Diagnosis not present

## 2021-12-08 DIAGNOSIS — M25569 Pain in unspecified knee: Secondary | ICD-10-CM | POA: Diagnosis not present

## 2021-12-09 DIAGNOSIS — N2581 Secondary hyperparathyroidism of renal origin: Secondary | ICD-10-CM | POA: Diagnosis not present

## 2021-12-09 DIAGNOSIS — Z23 Encounter for immunization: Secondary | ICD-10-CM | POA: Diagnosis not present

## 2021-12-09 DIAGNOSIS — N186 End stage renal disease: Secondary | ICD-10-CM | POA: Diagnosis not present

## 2021-12-09 DIAGNOSIS — Z992 Dependence on renal dialysis: Secondary | ICD-10-CM | POA: Diagnosis not present

## 2021-12-10 NOTE — Patient Instructions (Signed)
Visit Information  Following are the goals we discussed today:  Current Barriers:  Unable to independently monitor therapeutic efficacy  Pharmacist Clinical Goal(s):  patient will achieve adherence to monitoring guidelines and medication adherence to achieve therapeutic efficacy through collaboration with PharmD and provider.    Interventions: 1:1 collaboration with Ivy Lynn, NP regarding development and update of comprehensive plan of care as evidenced by provider attestation and co-signature Inter-disciplinary care team collaboration (see longitudinal plan of care) Comprehensive medication review performed; medication list updated in electronic medical record  Diabetes: New goal. Uncontrolled; current treatment: basal/bolus--patient is ESRD on HD MWF  Patient's most recent A1c in 10% in June 2023 Requesting Dexcom G7 CGM Will attempt to send to [facility] drug per patient request May need to go DME Patient to use her smartphone as reader  New G7 Sample left up front for patient to pick up Current glucose readings: fasting glucose: 200, post prandial glucose: >200 Denies/reports hypoglycemic/hyperglycemic symptoms Current meal patterns: Must follow renal/diabetes dietary recommendations Current exercise: N/A; ESRD HD MWF Educated on NEW DEXCOM G7--APPLY SENSOR TO ARM EVERY 10 DAYS; WILL USE SMART PHONE AS READER Plan to adjust regimen based on new CGM readings Reviewed medications for hypertension and hyperlipidemia   Patient Goals/Self-Care Activities patient will:  - take medications as prescribed as evidenced by patient report and record review check glucose utilizing new dexcom g7 cgm, document, and provide at future appointments target a minimum of 150 minutes of moderate intensity exercise weekly engage in dietary modifications by renal/diabetes dietary recommendations   Plan: Telephone follow up appointment with care management team member scheduled for:  1  month  Signature Regina Eck, PharmD, BCPS Clinical Pharmacist, [place]  II Phone [phone]   Please call the care guide team at [phone] if you need to cancel or reschedule your appointment.   The patient verbalized understanding of instructions, educational materials, and care plan provided today and DECLINED offer to receive copy of patient instructions, educational materials, and care plan.

## 2021-12-11 DIAGNOSIS — Z23 Encounter for immunization: Secondary | ICD-10-CM | POA: Diagnosis not present

## 2021-12-11 DIAGNOSIS — N186 End stage renal disease: Secondary | ICD-10-CM | POA: Diagnosis not present

## 2021-12-11 DIAGNOSIS — N2581 Secondary hyperparathyroidism of renal origin: Secondary | ICD-10-CM | POA: Diagnosis not present

## 2021-12-11 DIAGNOSIS — Z992 Dependence on renal dialysis: Secondary | ICD-10-CM | POA: Diagnosis not present

## 2021-12-14 DIAGNOSIS — Z23 Encounter for immunization: Secondary | ICD-10-CM | POA: Diagnosis not present

## 2021-12-14 DIAGNOSIS — N186 End stage renal disease: Secondary | ICD-10-CM | POA: Diagnosis not present

## 2021-12-14 DIAGNOSIS — N2581 Secondary hyperparathyroidism of renal origin: Secondary | ICD-10-CM | POA: Diagnosis not present

## 2021-12-14 DIAGNOSIS — Z992 Dependence on renal dialysis: Secondary | ICD-10-CM | POA: Diagnosis not present

## 2021-12-16 DIAGNOSIS — N186 End stage renal disease: Secondary | ICD-10-CM | POA: Diagnosis not present

## 2021-12-16 DIAGNOSIS — Z992 Dependence on renal dialysis: Secondary | ICD-10-CM | POA: Diagnosis not present

## 2021-12-16 DIAGNOSIS — Z23 Encounter for immunization: Secondary | ICD-10-CM | POA: Diagnosis not present

## 2021-12-16 DIAGNOSIS — N2581 Secondary hyperparathyroidism of renal origin: Secondary | ICD-10-CM | POA: Diagnosis not present

## 2021-12-18 DIAGNOSIS — N2581 Secondary hyperparathyroidism of renal origin: Secondary | ICD-10-CM | POA: Diagnosis not present

## 2021-12-18 DIAGNOSIS — Z23 Encounter for immunization: Secondary | ICD-10-CM | POA: Diagnosis not present

## 2021-12-18 DIAGNOSIS — Z992 Dependence on renal dialysis: Secondary | ICD-10-CM | POA: Diagnosis not present

## 2021-12-18 DIAGNOSIS — N186 End stage renal disease: Secondary | ICD-10-CM | POA: Diagnosis not present

## 2021-12-21 DIAGNOSIS — Z992 Dependence on renal dialysis: Secondary | ICD-10-CM | POA: Diagnosis not present

## 2021-12-21 DIAGNOSIS — N2581 Secondary hyperparathyroidism of renal origin: Secondary | ICD-10-CM | POA: Diagnosis not present

## 2021-12-21 DIAGNOSIS — Z23 Encounter for immunization: Secondary | ICD-10-CM | POA: Diagnosis not present

## 2021-12-21 DIAGNOSIS — N186 End stage renal disease: Secondary | ICD-10-CM | POA: Diagnosis not present

## 2021-12-21 NOTE — Progress Notes (Signed)
    Postoperative Access Visit   History of Present Illness   Sally Porter is a 41 y.o. year old female who presents for postoperative follow-up for: Superficialization and side branch ligation of left arm brachiocephalic arteriovenous fistula By Dr. Stanford Breed on 11/19/21. The patient's wounds are healing well. She has a little soreness still along incisions in upper arm. The patient notes no steal symptoms.  The patient is able to complete their activities of daily living.   She currently dialyzes on via right IJ [facility]  Physical Examination   Vitals:   12/22/21 1055  BP: (!) 204/99  Pulse: 85  Resp: 20  Temp: 97.9 F (36.6 C)  Weight: 249 lb (112.9 kg)  Height: 5\' 8"  (1.727 m)   Body mass index is 37.86 kg/m.  left arm Incision is healing well, 2+ radial pulse, hand grip is 5/5, sensation in digits is intact, palpable thrill, bruit can be auscultated     Medical Decision Making   Sally Porter is a 41 y.o. year old female who presents s/p Superficialization and side branch ligation of left arm brachiocephalic arteriovenous fistula By Dr. Stanford Breed on 11/19/21. Her incisions are healing well. The fistula has excellent thrill and is easily palpable in right upper arm. She still has some tenderness along incisions and would like to wait for this to resolve prior to cannulation which I feel is reasonable.   Patent is without signs or symptoms of steal syndrome The patient's access will be ready for use immediately The patient's tunneled dialysis catheter can be removed when Nephrology is comfortable with the performance of the AV fistula. Her TDC was placed by IR so they can be contacted for removal of [facility] The patient may follow up on a prn basis   Sally Caldwell, PA-C [facility]: [phone]  On call MD: Dr. Scot Dock

## 2021-12-22 ENCOUNTER — Other Ambulatory Visit: Payer: Self-pay | Admitting: Nurse Practitioner

## 2021-12-22 ENCOUNTER — Ambulatory Visit (INDEPENDENT_AMBULATORY_CARE_PROVIDER_SITE_OTHER): Payer: Medicare Other | Admitting: Physician Assistant

## 2021-12-22 VITALS — BP 204/99 | HR 85 | Temp 97.9°F | Resp 20 | Ht 68.0 in | Wt 249.0 lb

## 2021-12-22 DIAGNOSIS — N186 End stage renal disease: Secondary | ICD-10-CM

## 2021-12-22 DIAGNOSIS — E1165 Type 2 diabetes mellitus with hyperglycemia: Secondary | ICD-10-CM

## 2021-12-22 MED ORDER — INSULIN PEN NEEDLE 32G X 4 MM MISC: 3 refills | Status: AC

## 2021-12-22 MED ORDER — ALBUTEROL SULFATE HFA 108 (90 BASE) MCG/ACT IN AERS: 2.0000 | INHALATION_SPRAY | Freq: Four times a day (QID) | RESPIRATORY_TRACT | 5 refills | Status: AC | PRN

## 2021-12-22 NOTE — Telephone Encounter (Signed)
Pt also needs the tip for the humalog pen.

## 2021-12-22 NOTE — Telephone Encounter (Signed)
  Prescription Request  12/22/2021  Is this a "Controlled Substance" medicine? Albuterol inhaler  Have you seen your PCP in the last 2 weeks? Upcoming apt 01/19/2022  If YES, route message to pool  -  If NO, patient needs to be scheduled for appointment.  What is the name of the medication or equipment? Albuterol inhaler  Have you contacted your pharmacy to request a refill? yes   Which pharmacy would you like this sent to? [facility]   Patient notified that their request is being sent to the clinical staff for review and that they should receive a response within 2 business days.

## 2021-12-23 DIAGNOSIS — N186 End stage renal disease: Secondary | ICD-10-CM | POA: Diagnosis not present

## 2021-12-23 DIAGNOSIS — N2581 Secondary hyperparathyroidism of renal origin: Secondary | ICD-10-CM | POA: Diagnosis not present

## 2021-12-23 DIAGNOSIS — Z23 Encounter for immunization: Secondary | ICD-10-CM | POA: Diagnosis not present

## 2021-12-23 DIAGNOSIS — Z992 Dependence on renal dialysis: Secondary | ICD-10-CM | POA: Diagnosis not present

## 2021-12-23 NOTE — Telephone Encounter (Signed)
LMOVM refill sent to pharmacy 

## 2021-12-25 DIAGNOSIS — N186 End stage renal disease: Secondary | ICD-10-CM | POA: Diagnosis not present

## 2021-12-25 DIAGNOSIS — Z23 Encounter for immunization: Secondary | ICD-10-CM | POA: Diagnosis not present

## 2021-12-25 DIAGNOSIS — Z992 Dependence on renal dialysis: Secondary | ICD-10-CM | POA: Diagnosis not present

## 2021-12-25 DIAGNOSIS — N2581 Secondary hyperparathyroidism of renal origin: Secondary | ICD-10-CM | POA: Diagnosis not present

## 2021-12-28 DIAGNOSIS — Z23 Encounter for immunization: Secondary | ICD-10-CM | POA: Diagnosis not present

## 2021-12-28 DIAGNOSIS — N2581 Secondary hyperparathyroidism of renal origin: Secondary | ICD-10-CM | POA: Diagnosis not present

## 2021-12-28 DIAGNOSIS — N186 End stage renal disease: Secondary | ICD-10-CM | POA: Diagnosis not present

## 2021-12-28 DIAGNOSIS — Z992 Dependence on renal dialysis: Secondary | ICD-10-CM | POA: Diagnosis not present

## 2021-12-29 ENCOUNTER — Ambulatory Visit: Payer: Medicare Other | Admitting: Pharmacist

## 2021-12-29 DIAGNOSIS — E1022 Type 1 diabetes mellitus with diabetic chronic kidney disease: Secondary | ICD-10-CM

## 2021-12-29 DIAGNOSIS — E782 Mixed hyperlipidemia: Secondary | ICD-10-CM

## 2021-12-29 DIAGNOSIS — N186 End stage renal disease: Secondary | ICD-10-CM | POA: Diagnosis not present

## 2021-12-29 DIAGNOSIS — Z992 Dependence on renal dialysis: Secondary | ICD-10-CM | POA: Diagnosis not present

## 2021-12-30 DIAGNOSIS — Z992 Dependence on renal dialysis: Secondary | ICD-10-CM | POA: Diagnosis not present

## 2021-12-30 DIAGNOSIS — N186 End stage renal disease: Secondary | ICD-10-CM | POA: Diagnosis not present

## 2021-12-30 DIAGNOSIS — N2581 Secondary hyperparathyroidism of renal origin: Secondary | ICD-10-CM | POA: Diagnosis not present

## 2022-01-01 DIAGNOSIS — N186 End stage renal disease: Secondary | ICD-10-CM | POA: Diagnosis not present

## 2022-01-01 DIAGNOSIS — Z992 Dependence on renal dialysis: Secondary | ICD-10-CM | POA: Diagnosis not present

## 2022-01-01 DIAGNOSIS — N2581 Secondary hyperparathyroidism of renal origin: Secondary | ICD-10-CM | POA: Diagnosis not present

## 2022-01-03 ENCOUNTER — Other Ambulatory Visit: Payer: Self-pay | Admitting: Nurse Practitioner

## 2022-01-04 DIAGNOSIS — Z992 Dependence on renal dialysis: Secondary | ICD-10-CM | POA: Diagnosis not present

## 2022-01-04 DIAGNOSIS — N186 End stage renal disease: Secondary | ICD-10-CM | POA: Diagnosis not present

## 2022-01-04 DIAGNOSIS — N2581 Secondary hyperparathyroidism of renal origin: Secondary | ICD-10-CM | POA: Diagnosis not present

## 2022-01-05 ENCOUNTER — Ambulatory Visit (INDEPENDENT_AMBULATORY_CARE_PROVIDER_SITE_OTHER): Payer: Medicare Other | Admitting: Pharmacist

## 2022-01-05 DIAGNOSIS — E1022 Type 1 diabetes mellitus with diabetic chronic kidney disease: Secondary | ICD-10-CM

## 2022-01-05 DIAGNOSIS — N185 Chronic kidney disease, stage 5: Secondary | ICD-10-CM

## 2022-01-05 MED ORDER — DEXCOM G7 SENSOR MISC: 11 refills | Status: DC

## 2022-01-05 NOTE — Progress Notes (Signed)
01/05/2022 Name: Sally Porter MRN: [id number] DOB: 1980/12/18   Diabetes: goal progressing Uncontrolled; current treatment: basal/bolus--patient is ESRD on HD MWF  Reports using humalog ~8-10 units per meal 3 times daily, continue lantus dose at 35 units nightly Patient's most recent A1c in 10% in 11/2021 Reports Dexcom G7 CGM is helping her Refills sent to [facility] per patient request Patient to use her smartphone as reader  Education provided on CGM Current glucose readings: fasting glucose: <200, post prandial glucose: >200 Denies/reports hypoglycemic/hyperglycemic symptoms Current meal patterns: Must follow renal/diabetes dietary recommendations Current exercise: N/A; ESRD HD MWF Educated on NEW DEXCOM G7--APPLY SENSOR TO ARM EVERY 10 DAYS; WILL USE SMART PHONE AS READER Plan to adjust regimen based on new CGM readings Reviewed medications for hypertension and hyperlipidemia     Patient Goals/Self-Care Activities patient will:  - take medications as prescribed as evidenced by patient report and record review check glucose utilizing new dexcom g7 cgm, document, and provide at future appointments target a minimum of 150 minutes of moderate intensity exercise weekly engage in dietary modifications by renal/diabetes dietary recommendations     Subjective: Sally Porter is an 41 y.o. year old female who is a primary patient of Ivy Lynn, NP.  The CCM team was consulted for assistance with disease management and care coordination needs.     Engaged with patient by telephone for initial visit in response to provider referral for pharmacy case management and/or care coordination services.    Consent to Services:  The patient was given information about Chronic Care Management services, agreed to services, and gave verbal consent prior to initiation of services.  Please see initial visit note for detailed documentation.    Patient Care Team: Ivy Lynn, NP as  PCP - General (Nurse Practitioner) Lavera Guise, [facility] as Pharmacist (Family Medicine) [place], [facility]   Objective:   Recent Labs       Lab Results  Component Value Date    CREATININE 6.50 (H) 11/19/2021    CREATININE 8.62 (H) 08/20/2021    CREATININE 10.60 (H) 08/18/2021        Recent Labs       Lab Results  Component Value Date    HGBA1C 10.0 (H) 08/14/2021        Labs (Brief)          Component Value Date/Time    CHOL 200 (H) 08/10/2018 1013    TRIG 156 (H) 08/10/2018 1013    HDL 51 08/10/2018 1013    CHOLHDL 3.9 08/10/2018 1013    LDLCALC 122 (H) 08/10/2018 1013            Latest Ref Rng & Units 08/15/2021    3:16 AM 08/14/2021    1:31 PM 08/10/2018   10:13 AM  Hepatic Function  Total Protein 6.1 - 8.1 g/dL     7.2   Albumin 3.5 - 5.0 g/dL 2.5  2.7     AST 10 - 30 U/L     17   ALT 6 - 29 U/L     9   Total Bilirubin 0.2 - 1.2 mg/dL     0.2       Labs (Brief)       Lab Results  Component Value Date/Time    TSH 2.98 04/27/2019 12:00 AM    TSH 3.75 08/10/2018 10:13 AM    FREET4 0.8 08/10/2018 10:13 AM  Latest Ref Rng & Units 11/19/2021    6:28 AM 08/20/2021    4:18 AM 08/19/2021    8:08 AM  CBC  WBC 4.0 - 10.5 K/uL   9.1     Hemoglobin 12.0 - 15.0 g/dL 11.2  7.7  8.5   Hematocrit 36.0 - 46.0 % 33.0  25.2  25.0   Platelets 150 - 400 K/uL   301         Labs (Brief)       Lab Results  Component Value Date/Time    VD25OH 9.17 (L) 08/15/2021 04:00 PM    VD25OH 4.6 04/27/2019 12:00 AM        Clinical ASCVD: No  The ASCVD Risk score (Arnett DK, et al., 2019) failed to calculate for the following reasons:   The valid total cholesterol range is 130 to 320 mg/dL     Other: (CHADS2VASc if Afib, PHQ9 if depression, MMRC or CAT for COPD, ACT, DEXA)   Social History       Tobacco Use  Smoking Status Never  Smokeless Tobacco Never       BP Readings from Last 3 Encounters:  11/19/21 (!) 163/93  10/27/21  (!) 161/105  10/13/21 125/77       Pulse Readings from Last 3 Encounters:  11/19/21 86  10/27/21 66  10/13/21 64       Wt Readings from Last 3 Encounters:  11/19/21 242 lb (109.8 kg)  10/27/21 249 lb (112.9 kg)  10/13/21 243 lb (110.2 kg)      Assessment: Review of patient past medical history, allergies, medications, health status, including review of consultants reports, laboratory and other test data, was performed as part of comprehensive evaluation and provision of chronic care management services.    SDOH:  (Social Determinants of Health) assessments and interventions performed:      CCM Care Plan        Allergies  Allergen Reactions   Tylenol [Acetaminophen] Other (See Comments)      Patient stated she does not take due to her kidney function.   Dilaudid [Hydromorphone] Itching   Lyrica [Pregabalin] Itching   Morphine And Related Itching      Medications Reviewed Today       Reviewed by Lavera Guise, [facility] (Pharmacist) on 12/10/21 at 1541  Med List Status: <None>    Medication Order Taking? Sig Documenting Provider Last Dose Status Informant  albuterol (VENTOLIN HFA) 108 (90 Base) MCG/ACT inhaler [id number] No Inhale 2 puffs into the lungs every 6 (six) hours as needed for wheezing or shortness of breath. [provider] Past Month Active Self  ALPRAZolam (XANAX) 0.5 MG tablet [id number] No Take 0.5 mg by mouth 2 (two) times daily. [provider] 11/18/2021 Active Self  atenolol (TENORMIN) 50 MG tablet [id number] No Take 1 tablet (50 mg total) by mouth daily.  Patient not taking: Reported on 10/29/2021   Ivy Lynn, NP Not Taking Active Self  atorvastatin (LIPITOR) 20 MG tablet [id number] No Take 1 tablet (20 mg total) by mouth daily at 6 PM. Ivy Lynn, NP 11/18/2021 Active Self  calcitRIOL (ROCALTROL) 0.5 MCG capsule [id number] No TAKE ONE CAPSULE BY MOUTH DAILY Ivy Lynn, NP 11/18/2021 Active Self                   Med Note Gentry Roch   Thu Oct 29, 2021 10:53 AM) Given at dialysys  calcium acetate (PHOSLO) 667 MG capsule [id number] No TAKE  TWO CAPSULES BY MOUTH THREE TIMES DAILY WITH meals  Patient taking differently: Take 1,334 mg by mouth 3 (three) times daily with meals.   Ivy Lynn, NP 11/18/2021 Active Self  Continuous Blood Gluc Sensor ([place]) MISC [id number] Yes Apply to arm every 10 days to test blood sugar continuously. DX: E11.65 Ivy Lynn, NP   Active    ferrous sulfate 325 (65 FE) MG tablet [id number] No Take 325 mg by mouth daily with breakfast. [provider] 11/18/2021 Active Self  hydrALAZINE (APRESOLINE) 50 MG tablet [id number] No TAKE 1 TABLET BY MOUTH EVERY 8 HOURS Ivy Lynn, NP 11/19/2021 0400 Active Self  hydrocortisone cream 1 % [id number] No Apply 1 Application topically daily. [provider] Past Week Active Self  insulin glargine (LANTUS SOLOSTAR) 100 UNIT/ML Solostar Pen [id number] No Inject 35 Units into the skin at bedtime. Shelly Coss, MD 11/18/2021 Active Self  insulin lispro (HUMALOG KWIKPEN) 100 UNIT/ML KwikPen [id number] No Inject 0-15 Units into the skin 3 (three) times daily. Sliding scale Ivy Lynn, NP 11/19/2021 0400 Active                     Med Note Sander Nephew, GINGER E   Thu Nov 19, 2021  6:18 AM) 6 units    Multiple Vitamins-Minerals (ZINC PO) [id number] No Take by mouth. [provider] 11/18/2021 Active Self  nortriptyline (PAMELOR) 25 MG capsule [id number] No Take 25 mg by mouth 2 (two) times daily. [provider] 11/18/2021 Active Self  Oxycodone HCl 10 MG TABS [id number] No Take 10 mg by mouth every 6 (six) hours. [provider] 11/18/2021 Active Self  polyethylene glycol (MIRALAX / GLYCOLAX) 17 g packet [id number] No Take 17 g by mouth daily.  Patient taking differently: Take 17 g by mouth daily as needed for mild constipation or moderate constipation.   Shelly Coss, MD More than a month Active Self   rOPINIRole (REQUIP) 0.5 MG tablet [id number] No Take 0.5 mg by mouth at bedtime. [provider] 11/18/2021 Active Self  senna-docusate (SENOKOT-S) 8.6-50 MG tablet [id number] No Take 1 tablet by mouth 2 (two) times daily.  Patient taking differently: Take 1 tablet by mouth at bedtime as needed for mild constipation or moderate constipation.   Shelly Coss, MD Taking Active Self  Med List Note Jeannette How 10/29/21 1053): Dialysis M-W-F                 Patient Active Problem List    Diagnosis Date Noted   Acute renal failure superimposed on stage 4 chronic kidney disease ([place]) 08/14/2021   Intractable nausea and vomiting 08/14/2021   Obesity, Class III, BMI 40-49.9 (morbid obesity) ([place]) 08/14/2021   Opioid dependence ([place]) 08/14/2021   Uncontrolled type 2 diabetes mellitus with hyperglycemia, with long-term current use of insulin ([place]) 08/14/2021   Type 1 diabetes mellitus with stage 5 chronic kidney disease not on chronic dialysis ([place]) 05/01/2019   Mixed hyperlipidemia 05/01/2019   Essential hypertension, benign 05/01/2019   Vitamin D deficiency 05/01/2019   Personal history of noncompliance with medical treatment, presenting hazards to health 05/01/2019   Hyperkalemia 05/01/2019       Follow up appt scheduled with PharmD in 2 months  Regina Eck, PharmD, BCPS Clinical Pharmacist, [place]  II Phone [phone]

## 2022-01-06 ENCOUNTER — Ambulatory Visit (INDEPENDENT_AMBULATORY_CARE_PROVIDER_SITE_OTHER): Payer: Medicare Other | Admitting: *Deleted

## 2022-01-06 ENCOUNTER — Encounter: Payer: Medicare Other | Admitting: Family Medicine

## 2022-01-06 DIAGNOSIS — N186 End stage renal disease: Secondary | ICD-10-CM | POA: Diagnosis not present

## 2022-01-06 DIAGNOSIS — Z0000 Encounter for general adult medical examination without abnormal findings: Secondary | ICD-10-CM

## 2022-01-06 DIAGNOSIS — N2581 Secondary hyperparathyroidism of renal origin: Secondary | ICD-10-CM | POA: Diagnosis not present

## 2022-01-06 DIAGNOSIS — Z992 Dependence on renal dialysis: Secondary | ICD-10-CM | POA: Diagnosis not present

## 2022-01-06 NOTE — Progress Notes (Signed)
MEDICARE ANNUAL WELLNESS VISIT  01/06/2022  Telephone Visit Disclaimer This Medicare AWV was conducted by telephone due to national recommendations for restrictions regarding the COVID-19 Pandemic (e.g. social distancing).  I verified, using two identifiers, that I am speaking with Margreta Journey Anacker or their authorized healthcare agent. I discussed the limitations, risks, security, and privacy concerns of performing an evaluation and management service by telephone and the potential availability of an in-person appointment in the future. The patient expressed understanding and agreed to proceed.  Location of Patient: Home Location of Provider (nurse):  Office  Subjective:    Khamari Chaisson is a 41 y.o. female patient of Ivy Lynn, NP who had a Medicare Annual Wellness Visit today via telephone. Ahlani is Disabled and lives with their significant other Herbie [place]. she has 1 children. she reports that she is socially active and does interact with friends/family regularly. she is moderately physically active and enjoys making t-shirts, talking and visiting with her friends and family, watching TV and playing with her dog.  Patient Care Team: Ivy Lynn, NP as PCP - General (Nurse Practitioner) Lavera Guise, [facility] as Pharmacist (Family Medicine) [place], [place]     01/06/2022    8:21 AM 11/18/2021   11:17 AM 08/14/2021   11:47 AM 12/24/2014    9:05 AM  Advanced Directives  Does Patient Have a Medical Advance Directive? No No No No  Copy of Healthcare Power of Attorney in Chart?  No - copy requested    Would patient like information on creating a medical advance directive? No - Patient declined No - Patient declined No - Patient declined     Hospital Utilization Over the Past 12 Months: # of hospitalizations or ER visits: 3 # of surgeries: 2  Review of Systems    Patient reports that her overall health is unchanged compared to last  year.  History obtained from chart review and the patient  Patient Reported Readings (BP, Pulse, CBG, Weight, etc) Random Blood Sugar at 132  Pain Assessment Pain : 0-10 Pain Score: 7  Pain Type: Chronic pain Pain Location: Back Pain Onset: More than a month ago Pain Frequency: Constant Pain Relieving Factors: Pain medication Effect of Pain on Daily Activities: mild  Pain Relieving Factors: Pain medication  Current Medications & Allergies (verified) Allergies as of 01/06/2022       Reactions   Tylenol [acetaminophen] Other (See Comments)   Patient stated she does not take due to her kidney function.   Dilaudid [hydromorphone] Itching   Lyrica [pregabalin] Itching   Morphine And Related Itching        Medication List        Accurate as of January 06, 2022  8:31 AM. If you have any questions, ask your nurse or doctor.          albuterol 108 (90 Base) MCG/ACT inhaler Commonly known as: VENTOLIN HFA Inhale 2 puffs into the lungs every 6 (six) hours as needed for wheezing or shortness of breath.   ALPRAZolam 0.5 MG tablet Commonly known as: XANAX Take 0.5 mg by mouth 2 (two) times daily.   atenolol 50 MG tablet Commonly known as: TENORMIN Take 1 tablet (50 mg total) by mouth daily.   atorvastatin 20 MG tablet Commonly known as: LIPITOR Take 1 tablet (20 mg total) by mouth daily at 6 PM.   calcitRIOL 0.5 MCG capsule Commonly known as: ROCALTROL TAKE ONE CAPSULE BY MOUTH DAILY  calcium acetate 667 MG capsule Commonly known as: PHOSLO TAKE TWO CAPSULES BY MOUTH THREE TIMES DAILY WITH meals What changed: See the new instructions.   Dexcom G7 Sensor Misc Apply to arm every 10 days to test blood sugar continuously. DX: E11.65   ferrous sulfate 325 (65 FE) MG tablet Take 325 mg by mouth daily with breakfast.   hydrALAZINE 50 MG tablet Commonly known as: APRESOLINE TAKE 1 TABLET BY MOUTH EVERY 8 HOURS   hydrocortisone cream 1 % Apply 1 Application  topically daily.   insulin lispro 100 UNIT/ML KwikPen Commonly known as: HumaLOG KwikPen Inject 0-15 Units into the skin 3 (three) times daily. Sliding scale   Insulin Pen Needle 32G X 4 MM Misc Use with insulin TID Dx E11.65   Lantus SoloStar 100 UNIT/ML Solostar Pen Generic drug: insulin glargine Inject 35 Units into the skin at bedtime.   nortriptyline 25 MG capsule Commonly known as: PAMELOR Take 25 mg by mouth 2 (two) times daily.   Oxycodone HCl 10 MG Tabs Take 10 mg by mouth every 6 (six) hours.   polyethylene glycol 17 g packet Commonly known as: MIRALAX / GLYCOLAX Take 17 g by mouth daily. What changed:  when to take this reasons to take this   rOPINIRole 0.5 MG tablet Commonly known as: REQUIP Take 0.5 mg by mouth at bedtime.   senna-docusate 8.6-50 MG tablet Commonly known as: Senokot-S Take 1 tablet by mouth 2 (two) times daily. What changed:  when to take this reasons to take this   ZINC PO Take by mouth.        History (reviewed): Past Medical History:  Diagnosis Date   Anemia    Anxiety    Depression    Diabetes mellitus without complication ([place])    Reported history of DM type 1   ESRD (end stage renal disease) ([place])    short term hemodialysis 06/2017; resumed HD 08/15/21   Glaucoma    Hyperlipidemia    Hypertension    Past Surgical History:  Procedure Laterality Date   ANKLE SURGERY Right    AV FISTULA PLACEMENT Left 08/19/2021   Procedure: Creation of Brachial Cephalic LEFT ARM ARTERIOVENOUS (AV) FISTULA;  Surgeon: Cherre Robins, MD;  Location: [facility] OR;  Service: Vascular;  Laterality: Left;  PERIPHERAL NERVE BLOCK   CESAREAN SECTION  2009   FISTULA SUPERFICIALIZATION Left 11/19/2021   Procedure: LEFT ARM ARTERIOVENOUS FISTULA SUPERFICIALIZATION AND SIDE BRANCH LIGATION;  Surgeon: Cherre Robins, MD;  Location: [facility] OR;  Service: Vascular;  Laterality: Left;  PERIPHERAL NERVE BLOCK   FOOT SURGERY Left    IR FLUORO GUIDE CV LINE  RIGHT  08/15/2021   IR US GUIDE VASC ACCESS RIGHT  08/15/2021   Family History  Problem Relation Age of Onset   Arthritis Father    Alcohol abuse Father    Kidney disease Brother    Diabetes Daughter        Type 2   Asthma Daughter    Social History   Socioeconomic History   Marital status: Significant Other    Spouse name: Herbie [place]   Number of children: 1   Years of education: 13   Highest education level: Some college, no degree  Occupational History   Not on file  Tobacco Use   Smoking status: Former    Packs/day: 0.50    Years: 3.00    Total pack years: 1.50    Types: Cigarettes    Quit date: 2013  Years since quitting: 10.8   Smokeless tobacco: Never  Vaping Use   Vaping Use: Never used  Substance and Sexual Activity   Alcohol use: Not Currently   Drug use: Never   Sexual activity: Yes    Birth control/protection: None  Other Topics Concern   Not on file  Social History Narrative   Not on file   Social Determinants of Health   Financial Resource Strain: Low Risk  (01/06/2022)   Overall Financial Resource Strain (CARDIA)    Difficulty of Paying Living Expenses: Not hard at all  Food Insecurity: No Food Insecurity (01/06/2022)   Hunger Vital Sign    Worried About Running Out of Food in the Last Year: Never true    Ran Out of Food in the Last Year: Never true  Transportation Needs: No Transportation Needs (01/06/2022)   PRAPARE - Hydrologist (Medical): No    Lack of Transportation (Non-Medical): No  Physical Activity: Sufficiently Active (01/06/2022)   Exercise Vital Sign    Days of Exercise per Week: 5 days    Minutes of Exercise per Session: 60 min  Stress: No Stress Concern Present (01/06/2022)   [place]    Feeling of Stress : Not at all  Social Connections: Moderately Isolated (01/06/2022)   Social Connection and Isolation Panel [NHANES]    Frequency of  Communication with Friends and Family: More than three times a week    Frequency of Social Gatherings with Friends and Family: More than three times a week    Attends Religious Services: Never    Marine scientist or Organizations: No    Attends Archivist Meetings: Never    Marital Status: Living with partner    Activities of Daily Living    01/06/2022    8:22 AM 11/19/2021    6:27 AM  In your present state of health, do you have any difficulty performing the following activities:  Hearing? 0   Vision? 1   Comment Wears rx glasses-had diabetic eye exam with Dr Nicoletta Dress at [facility] in the past year   Difficulty concentrating or making decisions? 0   Walking or climbing stairs? 0   Dressing or bathing? 0   Doing errands, shopping? 0 0  Preparing Food and eating ? N   Using the Toilet? N   In the past six months, have you accidently leaked urine? N   Do you have problems with loss of bowel control? N   Managing your Medications? N   Managing your Finances? N   Housekeeping or managing your Housekeeping? N     Patient Education/ Literacy How often do you need to have someone help you when you read instructions, pamphlets, or other written materials from your doctor or pharmacy?: 1 - Never What is the last grade level you completed in school?: Some College  Exercise Current Exercise Habits: Home exercise routine, Type of exercise: treadmill, Time (Minutes): 60, Frequency (Times/Week): 5, Weekly Exercise (Minutes/Week): 300, Intensity: Moderate, Exercise limited by: None identified  Diet Patient reports consuming 3 meals a day and 2 snack(s) a day Patient reports that her primary diet is: Diabetic Patient reports that she does have regular access to food.   Depression Screen    01/06/2022    8:22 AM 10/13/2021    9:06 AM  PHQ 2/9 Scores  PHQ - 2 Score 0 0     Fall Risk  01/06/2022    8:22 AM 10/13/2021    9:06 AM  Fall Risk   Falls in the past year? 0 0      Objective:  Shacara Erazo seemed alert and oriented and she participated appropriately during our telephone visit.  Blood Pressure Weight BMI  BP Readings from Last 3 Encounters:  12/22/21 (!) 204/99  11/19/21 (!) 163/93  10/27/21 (!) 161/105   Wt Readings from Last 3 Encounters:  12/22/21 249 lb (112.9 kg)  11/19/21 242 lb (109.8 kg)  10/27/21 249 lb (112.9 kg)   BMI Readings from Last 1 Encounters:  12/22/21 37.86 kg/m    *Unable to obtain current vital signs, weight, and BMI due to telephone visit type  Hearing/Vision  Paetyn did not seem to have difficulty with hearing/understanding during the telephone conversation Reports that she has had a formal eye exam by an eye care professional within the past year Reports that she has not had a formal hearing evaluation within the past year *Unable to fully assess hearing and vision during telephone visit type  Cognitive Function:    01/06/2022    8:25 AM  6CIT Screen  What Year? 0 points  What month? 0 points  What time? 0 points  Count back from 20 0 points  Months in reverse 0 points  Repeat phrase 0 points  Total Score 0 points   (Normal:0-7, Significant for Dysfunction: >8)  Normal Cognitive Function Screening: Yes   Immunization & Health Maintenance Record Immunization History  Administered Date(s) Administered   PPD Test 08/18/2021   Unspecified SARS-COV-2 Vaccination 12/24/2019    Health Maintenance  Topic Date Due   Medicare Annual Wellness (AWV)  Never done   PAP SMEAR-Modifier  Never done   COVID-19 Vaccine (2 - Mixed Product risk series) 02/11/2020   INFLUENZA VACCINE  05/30/2022 (Originally 09/29/2021)   HEMOGLOBIN A1C  02/13/2022   FOOT EXAM  07/15/2022   OPHTHALMOLOGY EXAM  07/15/2022   TETANUS/TDAP  07/08/2027   Hepatitis C Screening  Completed   HIV Screening  Completed   HPV VACCINES  Aged Out       Assessment  This is a routine wellness examination for [employer].  Health Maintenance: Due or Overdue Health Maintenance Due  Topic Date Due   Medicare Annual Wellness (AWV)  Never done   PAP SMEAR-Modifier  Never done   COVID-19 Vaccine (2 - Mixed Product risk series) 02/11/2020    Tuesday Showman does not need a referral for Community Assistance: Care Management:   no Social Work:    no Prescription Assistance:  no Nutrition/Diabetes Education:  no   Plan:  Personalized Goals  Goals Addressed               This Visit's Progress     Patient Stated (pt-stated)        " I just want to do everything I can to get my kidney transplant so I can get back to regular living"       Personalized Health Maintenance & Screening Recommendations  Advanced directives: has NO advanced directive - not interested in additional information  Diabetic Eye Exam-[facility]let[facility] at [facility] records  Lung Cancer Screening Recommended: not applicable (Low Dose CT Chest recommended if Age 22-80 years, 30 pack-year currently smoking OR have quit w/in past 15 years) Hepatitis C Screening recommended: no HIV Screening recommended: no  Advanced Directives: Written information was not prepared per patient's request.  Referrals & Orders No  orders of the defined types were placed in this encounter.   Follow-up Plan Follow-up with Ivy Lynn, NP as planned Follow-up with all specialists as planned Continue Dialysis as planned   I have personally reviewed and noted the following in the patient's chart:   Medical and social history Use of alcohol, tobacco or illicit drugs  Current medications and supplements Functional ability and status Nutritional status Physical activity Advanced directives List of other physicians Hospitalizations, surgeries, and ER visits in previous 12 months Vitals Screenings to include cognitive, depression, and falls Referrals and appointments  In addition, I have reviewed and discussed with Necola  Ortwein certain preventive protocols, quality metrics, and best practice recommendations. A written personalized care plan for preventive services as well as general preventive health recommendations is available and can be mailed to the patient at her request.      Milas Hock, LPN  [phone]

## 2022-01-06 NOTE — Progress Notes (Signed)
Didn't respond to link. Didn't answer phone. Mailbox full. Could not leave a message. Erroneous visit WS

## 2022-01-08 DIAGNOSIS — Z992 Dependence on renal dialysis: Secondary | ICD-10-CM | POA: Diagnosis not present

## 2022-01-08 DIAGNOSIS — N186 End stage renal disease: Secondary | ICD-10-CM | POA: Diagnosis not present

## 2022-01-08 DIAGNOSIS — N2581 Secondary hyperparathyroidism of renal origin: Secondary | ICD-10-CM | POA: Diagnosis not present

## 2022-01-11 DIAGNOSIS — N2581 Secondary hyperparathyroidism of renal origin: Secondary | ICD-10-CM | POA: Diagnosis not present

## 2022-01-11 DIAGNOSIS — N186 End stage renal disease: Secondary | ICD-10-CM | POA: Diagnosis not present

## 2022-01-11 DIAGNOSIS — Z992 Dependence on renal dialysis: Secondary | ICD-10-CM | POA: Diagnosis not present

## 2022-01-13 DIAGNOSIS — Z992 Dependence on renal dialysis: Secondary | ICD-10-CM | POA: Diagnosis not present

## 2022-01-13 DIAGNOSIS — N186 End stage renal disease: Secondary | ICD-10-CM | POA: Diagnosis not present

## 2022-01-13 DIAGNOSIS — N2581 Secondary hyperparathyroidism of renal origin: Secondary | ICD-10-CM | POA: Diagnosis not present

## 2022-01-14 ENCOUNTER — Telehealth (HOSPITAL_COMMUNITY): Payer: Self-pay | Admitting: *Deleted

## 2022-01-14 NOTE — Telephone Encounter (Signed)
Received fax from [facility] dialysis of [facility] for CVC removal by TFE at [facility]. Saw PA 12/22/2021. Will give to [facility] to schedule.

## 2022-01-15 DIAGNOSIS — N2581 Secondary hyperparathyroidism of renal origin: Secondary | ICD-10-CM | POA: Diagnosis not present

## 2022-01-15 DIAGNOSIS — Z992 Dependence on renal dialysis: Secondary | ICD-10-CM | POA: Diagnosis not present

## 2022-01-15 DIAGNOSIS — N186 End stage renal disease: Secondary | ICD-10-CM | POA: Diagnosis not present

## 2022-01-18 ENCOUNTER — Other Ambulatory Visit: Payer: Self-pay

## 2022-01-18 ENCOUNTER — Other Ambulatory Visit: Payer: Self-pay | Admitting: *Deleted

## 2022-01-18 DIAGNOSIS — N186 End stage renal disease: Secondary | ICD-10-CM | POA: Diagnosis not present

## 2022-01-18 DIAGNOSIS — N2581 Secondary hyperparathyroidism of renal origin: Secondary | ICD-10-CM | POA: Diagnosis not present

## 2022-01-18 DIAGNOSIS — Z992 Dependence on renal dialysis: Secondary | ICD-10-CM | POA: Diagnosis not present

## 2022-01-18 MED ORDER — LANTUS SOLOSTAR 100 UNIT/ML ~~LOC~~ SOPN: 35.0000 [IU] | PEN_INJECTOR | Freq: Every day | SUBCUTANEOUS | 0 refills | Status: DC

## 2022-01-19 ENCOUNTER — Encounter: Payer: Self-pay | Admitting: Nurse Practitioner

## 2022-01-19 ENCOUNTER — Ambulatory Visit: Payer: Medicare Other | Admitting: Nurse Practitioner

## 2022-01-20 DIAGNOSIS — N2581 Secondary hyperparathyroidism of renal origin: Secondary | ICD-10-CM | POA: Diagnosis not present

## 2022-01-20 DIAGNOSIS — N186 End stage renal disease: Secondary | ICD-10-CM | POA: Diagnosis not present

## 2022-01-20 DIAGNOSIS — Z992 Dependence on renal dialysis: Secondary | ICD-10-CM | POA: Diagnosis not present

## 2022-01-22 DIAGNOSIS — N2581 Secondary hyperparathyroidism of renal origin: Secondary | ICD-10-CM | POA: Diagnosis not present

## 2022-01-22 DIAGNOSIS — Z992 Dependence on renal dialysis: Secondary | ICD-10-CM | POA: Diagnosis not present

## 2022-01-22 DIAGNOSIS — N186 End stage renal disease: Secondary | ICD-10-CM | POA: Diagnosis not present

## 2022-01-25 DIAGNOSIS — Z992 Dependence on renal dialysis: Secondary | ICD-10-CM | POA: Diagnosis not present

## 2022-01-25 DIAGNOSIS — N2581 Secondary hyperparathyroidism of renal origin: Secondary | ICD-10-CM | POA: Diagnosis not present

## 2022-01-25 DIAGNOSIS — N186 End stage renal disease: Secondary | ICD-10-CM | POA: Diagnosis not present

## 2022-01-26 ENCOUNTER — Encounter (HOSPITAL_COMMUNITY)
Admission: RE | Disposition: A | Discharge status: Home / Self Care | Payer: Self-pay | Source: Home / Self Care | Attending: Vascular Surgery

## 2022-01-26 ENCOUNTER — Ambulatory Visit (HOSPITAL_COMMUNITY)
Admission: RE | Admit: 2022-01-26 | Discharge: 2022-01-26 | Disposition: A | Discharge status: Home / Self Care | Payer: Medicare Other | Attending: Vascular Surgery | Admitting: Vascular Surgery

## 2022-01-26 DIAGNOSIS — Z992 Dependence on renal dialysis: Secondary | ICD-10-CM | POA: Diagnosis not present

## 2022-01-26 DIAGNOSIS — I770 Arteriovenous fistula, acquired: Secondary | ICD-10-CM | POA: Insufficient documentation

## 2022-01-26 DIAGNOSIS — N186 End stage renal disease: Secondary | ICD-10-CM | POA: Diagnosis not present

## 2022-01-26 DIAGNOSIS — N185 Chronic kidney disease, stage 5: Secondary | ICD-10-CM | POA: Diagnosis not present

## 2022-01-26 HISTORY — PX: REMOVAL OF A DIALYSIS CATHETER: SHX6053

## 2022-01-26 SURGERY — REMOVAL, DIALYSIS CATHETER
Anesthesia: LOCAL

## 2022-01-26 MED ORDER — LIDOCAINE HCL (PF) 1 % IJ SOLN
INTRAMUSCULAR | Status: DC | PRN
  Administered 2022-01-26: 10 mL via SUBCUTANEOUS

## 2022-01-26 SURGICAL SUPPLY — 20 items
ADH SKN CLS APL DERMABOND .7 (GAUZE/BANDAGES/DRESSINGS) ×1
APL PRP STRL LF ISPRP CHG 10.5 (MISCELLANEOUS) ×1
APPLICATOR CHLORAPREP 10.5 ORG (MISCELLANEOUS) ×1 IMPLANT
CLOTH BEACON ORANGE TIMEOUT ST (SAFETY) ×1 IMPLANT
DECANTER SPIKE VIAL GLASS SM (MISCELLANEOUS) ×1 IMPLANT
DERMABOND ADVANCED .7 DNX12 (GAUZE/BANDAGES/DRESSINGS) ×1 IMPLANT
DRAPE HALF SHEET 40X57 (DRAPES) IMPLANT
ELECT REM PT RETURN 9FT ADLT (ELECTROSURGICAL) ×1
ELECTRODE REM PT RTRN 9FT ADLT (ELECTROSURGICAL) ×1 IMPLANT
GLOVE BIOGEL PI IND STRL 7.0 (GLOVE) ×2 IMPLANT
GOWN STRL REUS W/TWL LRG LVL3 (GOWN DISPOSABLE) IMPLANT
NDL HYPO 25X1 1.5 SAFETY (NEEDLE) ×1 IMPLANT
NEEDLE HYPO 25X1 1.5 SAFETY (NEEDLE) ×1 IMPLANT
PENCIL SMOKE EVACUATOR COATED (MISCELLANEOUS) IMPLANT
SPONGE GAUZE 2X2 8PLY STRL LF (GAUZE/BANDAGES/DRESSINGS) ×1 IMPLANT
SUT MNCRL AB 4-0 PS2 18 (SUTURE) ×1 IMPLANT
SUT VIC AB 3-0 SH 27 (SUTURE) ×1
SUT VIC AB 3-0 SH 27X BRD (SUTURE) ×1 IMPLANT
SYR CONTROL 10ML LL (SYRINGE) ×1 IMPLANT
TOWEL OR 17X26 4PK STRL BLUE (TOWEL DISPOSABLE) ×1 IMPLANT

## 2022-01-26 NOTE — H&P (Signed)
   [facility] [place]  Patient name: Sally Porter MRN: [id number] DOB: 11/03/1980 Sex: female   HPI: Sally Porter is a 41 y.o. female here today for removal of right IJ tunneled catheter.  She has nice function of her left upper arm AV fistula this is being used successfully at [facility] dialysis center  No current facility-administered medications for this encounter.     PHYSICAL EXAM: Vitals:   01/26/22 0841  BP: (!) 157/76  Pulse: 83  Temp: 98.3 F (36.8 C)  TempSrc: Oral  SpO2: 98%  Weight: 112.9 kg  Height: 5\' 8"  (1.727 m)    GENERAL: The patient is a well-nourished female, in no acute distress. The vital signs are documented above. Excellent thrill in her left upper arm AV fistula. Right IJ tunnel catheter in place  MEDICAL ISSUES: For removal of tunneled catheter   Rosetta Posner, MD FACS [facility] [place] Office Tel [phone]  Note: Portions of this report may have been transcribed using voice recognition software.  Every effort has been made to ensure accuracy; however, inadvertent computerized transcription errors may still be present.

## 2022-01-26 NOTE — Op Note (Signed)
    OPERATIVE REPORT  DATE OF SURGERY: 01/26/2022  PATIENT: Sally Porter, 41 y.o. female MRN: [id number]  DOB: Jun 17, 1980  PRE-OPERATIVE DIAGNOSIS: End-stage renal disease  POST-OPERATIVE DIAGNOSIS:  Same  PROCEDURE: Removal of right IJ tunneled dialysis catheter  SURGEON:  Curt Jews, M.D.  PHYSICIAN ASSISTANT: Sally Porter  The assistant was needed for exposure and to expedite the case  ANESTHESIA: 1% lidocaine local  EBL: per anesthesia record  No intake/output data recorded.  BLOOD ADMINISTERED: none  DRAINS: none  SPECIMEN: none  COUNTS CORRECT:  YES  PATIENT DISPOSITION:  PACU - hemodynamically stable  PROCEDURE DETAILS: The patient was taken the minor procedure room.  The area of the right neck and chest prepped draped you sterile fashion.  Using local anesthesia incision was made over the Dacron cuff and the cuff was mobilized in its entirety.  The catheter was removed in its entirety and pressure was held for hemostasis.  The incision was closed with a 4-0 Monocryl subcuticular suture.  Sterile dressing was applied and the patient was dressed discharged to home   Rosetta Posner, M.D., [facility] 01/26/2022 9:53 AM  Note: Portions of this report may have been transcribed using voice recognition software.  Every effort has been made to ensure accuracy; however, inadvertent computerized transcription errors may still be present.

## 2022-01-27 DIAGNOSIS — N2581 Secondary hyperparathyroidism of renal origin: Secondary | ICD-10-CM | POA: Diagnosis not present

## 2022-01-27 DIAGNOSIS — N186 End stage renal disease: Secondary | ICD-10-CM | POA: Diagnosis not present

## 2022-01-27 DIAGNOSIS — Z992 Dependence on renal dialysis: Secondary | ICD-10-CM | POA: Diagnosis not present

## 2022-01-28 ENCOUNTER — Other Ambulatory Visit: Payer: Self-pay | Admitting: Nurse Practitioner

## 2022-01-28 DIAGNOSIS — N186 End stage renal disease: Secondary | ICD-10-CM | POA: Diagnosis not present

## 2022-01-28 DIAGNOSIS — Z992 Dependence on renal dialysis: Secondary | ICD-10-CM | POA: Diagnosis not present

## 2022-01-28 DIAGNOSIS — E1165 Type 2 diabetes mellitus with hyperglycemia: Secondary | ICD-10-CM

## 2022-01-29 ENCOUNTER — Telehealth: Payer: Self-pay | Admitting: Nurse Practitioner

## 2022-01-29 ENCOUNTER — Encounter (HOSPITAL_COMMUNITY): Payer: Self-pay | Admitting: Vascular Surgery

## 2022-01-29 DIAGNOSIS — N2581 Secondary hyperparathyroidism of renal origin: Secondary | ICD-10-CM | POA: Diagnosis not present

## 2022-01-29 DIAGNOSIS — N186 End stage renal disease: Secondary | ICD-10-CM | POA: Diagnosis not present

## 2022-01-29 DIAGNOSIS — Z23 Encounter for immunization: Secondary | ICD-10-CM | POA: Diagnosis not present

## 2022-01-29 DIAGNOSIS — Z992 Dependence on renal dialysis: Secondary | ICD-10-CM | POA: Diagnosis not present

## 2022-01-29 MED ORDER — INSULIN LISPRO (1 UNIT DIAL) 100 UNIT/ML (KWIKPEN): 0.0000 [IU] | PEN_INJECTOR | Freq: Three times a day (TID) | SUBCUTANEOUS | 0 refills | Status: AC

## 2022-01-29 NOTE — Telephone Encounter (Signed)
Pt aware refill sent to pharmacy. 3 mos ckup made for 02/09/22

## 2022-01-29 NOTE — Telephone Encounter (Signed)
  Prescription Request  01/29/2022  Is this a "Controlled Substance" medicine?   Have you seen your PCP in the last 2 weeks?   If YES, route message to pool  -  If NO, patient needs to be scheduled for appointment.  What is the name of the medication or equipment? insulin lispro (HUMALOG KWIKPEN) 100 UNIT/ML KwikPen   Have you contacted your pharmacy to request a refill? yes   Which pharmacy would you like this sent to? [facility]    Patient notified that their request is being sent to the clinical staff for review and that they should receive a response within 2 business days.

## 2022-02-01 DIAGNOSIS — Z992 Dependence on renal dialysis: Secondary | ICD-10-CM | POA: Diagnosis not present

## 2022-02-01 DIAGNOSIS — N186 End stage renal disease: Secondary | ICD-10-CM | POA: Diagnosis not present

## 2022-02-01 DIAGNOSIS — N2581 Secondary hyperparathyroidism of renal origin: Secondary | ICD-10-CM | POA: Diagnosis not present

## 2022-02-01 DIAGNOSIS — Z23 Encounter for immunization: Secondary | ICD-10-CM | POA: Diagnosis not present

## 2022-02-02 ENCOUNTER — Other Ambulatory Visit: Payer: Self-pay | Admitting: Nurse Practitioner

## 2022-02-02 DIAGNOSIS — M25569 Pain in unspecified knee: Secondary | ICD-10-CM | POA: Diagnosis not present

## 2022-02-02 DIAGNOSIS — Z79891 Long term (current) use of opiate analgesic: Secondary | ICD-10-CM | POA: Diagnosis not present

## 2022-02-02 DIAGNOSIS — M79672 Pain in left foot: Secondary | ICD-10-CM | POA: Diagnosis not present

## 2022-02-02 DIAGNOSIS — G2581 Restless legs syndrome: Secondary | ICD-10-CM | POA: Diagnosis not present

## 2022-02-02 DIAGNOSIS — M545 Low back pain, unspecified: Secondary | ICD-10-CM | POA: Diagnosis not present

## 2022-02-02 DIAGNOSIS — G4700 Insomnia, unspecified: Secondary | ICD-10-CM | POA: Diagnosis not present

## 2022-02-03 DIAGNOSIS — Z23 Encounter for immunization: Secondary | ICD-10-CM | POA: Diagnosis not present

## 2022-02-03 DIAGNOSIS — N2581 Secondary hyperparathyroidism of renal origin: Secondary | ICD-10-CM | POA: Diagnosis not present

## 2022-02-03 DIAGNOSIS — N186 End stage renal disease: Secondary | ICD-10-CM | POA: Diagnosis not present

## 2022-02-03 DIAGNOSIS — Z992 Dependence on renal dialysis: Secondary | ICD-10-CM | POA: Diagnosis not present

## 2022-02-05 DIAGNOSIS — N2581 Secondary hyperparathyroidism of renal origin: Secondary | ICD-10-CM | POA: Diagnosis not present

## 2022-02-05 DIAGNOSIS — Z992 Dependence on renal dialysis: Secondary | ICD-10-CM | POA: Diagnosis not present

## 2022-02-05 DIAGNOSIS — N186 End stage renal disease: Secondary | ICD-10-CM | POA: Diagnosis not present

## 2022-02-05 DIAGNOSIS — Z23 Encounter for immunization: Secondary | ICD-10-CM | POA: Diagnosis not present

## 2022-02-07 ENCOUNTER — Other Ambulatory Visit: Payer: Self-pay | Admitting: Nurse Practitioner

## 2022-02-08 DIAGNOSIS — Z23 Encounter for immunization: Secondary | ICD-10-CM | POA: Diagnosis not present

## 2022-02-08 DIAGNOSIS — N2581 Secondary hyperparathyroidism of renal origin: Secondary | ICD-10-CM | POA: Diagnosis not present

## 2022-02-08 DIAGNOSIS — N186 End stage renal disease: Secondary | ICD-10-CM | POA: Diagnosis not present

## 2022-02-08 DIAGNOSIS — Z992 Dependence on renal dialysis: Secondary | ICD-10-CM | POA: Diagnosis not present

## 2022-02-09 ENCOUNTER — Ambulatory Visit (INDEPENDENT_AMBULATORY_CARE_PROVIDER_SITE_OTHER): Payer: Medicare Other | Admitting: Nurse Practitioner

## 2022-02-09 ENCOUNTER — Encounter: Payer: Self-pay | Admitting: Nurse Practitioner

## 2022-02-09 ENCOUNTER — Ambulatory Visit: Payer: Medicare Other | Admitting: Nurse Practitioner

## 2022-02-09 VITALS — BP 202/76 | HR 69 | Temp 98.7°F | Ht 68.0 in | Wt 252.0 lb

## 2022-02-09 DIAGNOSIS — E782 Mixed hyperlipidemia: Secondary | ICD-10-CM

## 2022-02-09 DIAGNOSIS — Z794 Long term (current) use of insulin: Secondary | ICD-10-CM

## 2022-02-09 DIAGNOSIS — G8929 Other chronic pain: Secondary | ICD-10-CM | POA: Diagnosis not present

## 2022-02-09 DIAGNOSIS — E1165 Type 2 diabetes mellitus with hyperglycemia: Secondary | ICD-10-CM | POA: Diagnosis not present

## 2022-02-09 DIAGNOSIS — I10 Essential (primary) hypertension: Secondary | ICD-10-CM | POA: Diagnosis not present

## 2022-02-09 NOTE — Assessment & Plan Note (Signed)
Dexacom meter functioning as expected, [patient has not experienced hypo glycemic or hyper glycemic episode  in the past few months.

## 2022-02-09 NOTE — Progress Notes (Signed)
Established Patient Office Visit  Subjective   Patient ID: Sally Porter, female    DOB: 12/26/1980  Age: 41 y.o. MRN: [id number]  Chief Complaint  Patient presents with   Medication Refill   Medical Management of Chronic Issues    3 month    Diabetes She presents for her follow-up diabetic visit. She has type 2 diabetes mellitus. Her disease course has been fluctuating. There are no hypoglycemic associated symptoms. Associated symptoms include blurred vision and fatigue. Pertinent negatives for diabetes include no foot paresthesias, no foot ulcerations and no polyuria. There are no hypoglycemic complications. Symptoms are stable. There are no diabetic complications. Risk factors for coronary artery disease include dyslipidemia and obesity. She is compliant with treatment all of the time. Her weight is decreasing steadily. She is following a generally healthy diet. She has not had a previous visit with a dietitian. She rarely participates in exercise. There is no change in her home blood glucose trend. Eye exam is not current.  Hypertension This is a chronic problem. The current episode started more than 1 year ago. The problem has been gradually improving since onset. The problem is controlled. Associated symptoms include blurred vision. Risk factors for coronary artery disease include obesity, dyslipidemia and diabetes mellitus. The current treatment provides no improvement. There are no compliance problems.  Hypertensive end-organ damage includes kidney disease.    Patient Active Problem List   Diagnosis Date Noted   Acute renal failure superimposed on stage 4 chronic kidney disease ([place]) 08/14/2021   Intractable nausea and vomiting 08/14/2021   Obesity, Class III, BMI 40-49.9 (morbid obesity) ([place]) 08/14/2021   Opioid dependence ([place]) 08/14/2021   Uncontrolled type 2 diabetes mellitus with hyperglycemia, with long-term current use of insulin ([place]) 08/14/2021   Type 1 diabetes  mellitus with stage 5 chronic kidney disease not on chronic dialysis ([place]) 05/01/2019   Mixed hyperlipidemia 05/01/2019   Essential hypertension, benign 05/01/2019   Vitamin D deficiency 05/01/2019   Personal history of noncompliance with medical treatment, presenting hazards to health 05/01/2019   Hyperkalemia 05/01/2019   Past Medical History:  Diagnosis Date   Anemia    Anxiety    Depression    Diabetes mellitus without complication ([place])    Reported history of DM type 1   ESRD (end stage renal disease) ([place])    short term hemodialysis 06/2017; resumed HD 08/15/21   Glaucoma    Hyperlipidemia    Hypertension    Past Surgical History:  Procedure Laterality Date   ANKLE SURGERY Right    AV FISTULA PLACEMENT Left 08/19/2021   Procedure: Creation of Brachial Cephalic LEFT ARM ARTERIOVENOUS (AV) FISTULA;  Surgeon: Cherre Robins, MD;  Location: [facility];  Service: Vascular;  Laterality: Left;  PERIPHERAL NERVE BLOCK   CESAREAN SECTION  2009   FISTULA SUPERFICIALIZATION Left 11/19/2021   Procedure: LEFT ARM ARTERIOVENOUS FISTULA SUPERFICIALIZATION AND SIDE BRANCH LIGATION;  Surgeon: Cherre Robins, MD;  Location: [facility];  Service: Vascular;  Laterality: Left;  PERIPHERAL NERVE BLOCK   FOOT SURGERY Left    IR FLUORO GUIDE CV LINE RIGHT  08/15/2021   IR US GUIDE VASC ACCESS RIGHT  08/15/2021   REMOVAL OF A DIALYSIS CATHETER N/A 01/26/2022   Procedure: MINOR REMOVAL OF A DIALYSIS CATHETER;  Surgeon: Rosetta Posner, MD;  Location: [facility];  Service: Vascular;  Laterality: N/A;   Social History   Tobacco Use   Smoking status: Former    Packs/day: 0.50  Years: 3.00    Total pack years: 1.50    Types: Cigarettes    Quit date: 2013    Years since quitting: 10.9   Smokeless tobacco: Never  Vaping Use   Vaping Use: Never used  Substance Use Topics   Alcohol use: Not Currently   Drug use: Never   Social History   Socioeconomic History   Marital status: Significant Other     Spouse name: Herbie [place]   Number of children: 1   Years of education: 13   Highest education level: Some college, no degree  Occupational History   Not on file  Tobacco Use   Smoking status: Former    Packs/day: 0.50    Years: 3.00    Total pack years: 1.50    Types: Cigarettes    Quit date: 2013    Years since quitting: 10.9   Smokeless tobacco: Never  Vaping Use   Vaping Use: Never used  Substance and Sexual Activity   Alcohol use: Not Currently   Drug use: Never   Sexual activity: Yes    Birth control/protection: None  Other Topics Concern   Not on file  Social History Narrative   Not on file   Social Determinants of Health   Financial Resource Strain: Low Risk  (01/06/2022)   Overall Financial Resource Strain (CARDIA)    Difficulty of Paying Living Expenses: Not hard at all  Food Insecurity: No Food Insecurity (01/06/2022)   Hunger Vital Sign    Worried About Running Out of Food in the Last Year: Never true    [place] in the Last Year: Never true  Transportation Needs: No Transportation Needs (01/06/2022)   PRAPARE - Hydrologist (Medical): No    Lack of Transportation (Non-Medical): No  Physical Activity: Sufficiently Active (01/06/2022)   Exercise Vital Sign    Days of Exercise per Week: 5 days    Minutes of Exercise per Session: 60 min  Stress: No Stress Concern Present (01/06/2022)   [place]    Feeling of Stress : Not at all  Social Connections: Moderately Isolated (01/06/2022)   Social Connection and Isolation Panel [NHANES]    Frequency of Communication with Friends and Family: More than three times a week    Frequency of Social Gatherings with Friends and Family: More than three times a week    Attends Religious Services: Never    Marine scientist or Organizations: No    Attends Archivist Meetings: Never    Marital Status: Living with partner   Intimate Partner Violence: Not At Risk (01/06/2022)   Humiliation, Afraid, Rape, and Kick questionnaire    Fear of Current or Ex-Partner: No    Emotionally Abused: No    Physically Abused: No    Sexually Abused: No   Family Status  Relation Name Status   Mother  Alive   Father  Alive   Brother  Alive   Daughter  Alive   MGM  Deceased   MGF  Deceased   PGM  Deceased   PGF  Deceased   Family History  Problem Relation Age of Onset   Arthritis Father    Alcohol abuse Father    Kidney disease Brother    Diabetes Daughter        Type 2   Asthma Daughter    Allergies  Allergen Reactions   Tylenol [Acetaminophen] Other (See Comments)  Patient stated she does not take due to her kidney function.   Dilaudid [Hydromorphone] Itching   Lyrica [Pregabalin] Itching   Morphine And Related Itching      Review of Systems  Constitutional:  Positive for fatigue.  HENT: Negative.    Eyes:  Positive for blurred vision.  Respiratory: Negative.    Cardiovascular:  Positive for leg swelling.  Genitourinary: Negative.   Skin: Negative.  Negative for itching and rash.  Psychiatric/Behavioral: Negative.    All other systems reviewed and are negative.     Objective:     BP (!) 202/76   Pulse 69   Temp 98.7 F (37.1 C)   Ht 5\' 8"  (1.727 m)   Wt 252 lb (114.3 kg)   LMP 01/30/2022 Comment: start date  SpO2 100%   BMI 38.32 kg/m  BP Readings from Last 3 Encounters:  02/09/22 (!) 202/76  01/26/22 (!) 157/76  12/22/21 (!) 204/99   Wt Readings from Last 3 Encounters:  02/09/22 252 lb (114.3 kg)  01/26/22 248 lb 14.4 oz (112.9 kg)  12/22/21 249 lb (112.9 kg)      Physical Exam Vitals and nursing note reviewed.  Constitutional:      Appearance: Normal appearance. She is obese.  HENT:     Right Ear: External ear normal.     Left Ear: External ear normal.     Nose: Nose normal.     Mouth/Throat:     Mouth: Mucous membranes are moist.     Pharynx: Oropharynx is clear.   Eyes:     Conjunctiva/sclera: Conjunctivae normal.     Pupils: Pupils are equal, round, and reactive to light.  Cardiovascular:     Rate and Rhythm: Normal rate and regular rhythm.     Pulses: Normal pulses.     Heart sounds: Normal heart sounds.  Pulmonary:     Effort: Pulmonary effort is normal.     Breath sounds: Normal breath sounds.  Abdominal:     General: Bowel sounds are normal.  Skin:    General: Skin is warm.  Neurological:     General: No focal deficit present.     Mental Status: She is alert and oriented to person, place, and time.  Psychiatric:        Mood and Affect: Mood normal.        Behavior: Behavior normal.      No results found for any visits on 02/09/22.  Last CBC Lab Results  Component Value Date   WBC 9.1 08/20/2021   HGB 11.2 (L) 11/19/2021   HCT 33.0 (L) 11/19/2021   MCV 78.8 (L) 08/20/2021   MCH 24.1 (L) 08/20/2021   RDW 17.1 (H) 08/20/2021   PLT 301 13/09/6576   Last metabolic panel Lab Results  Component Value Date   GLUCOSE 136 (H) 11/19/2021   NA 140 11/19/2021   K 4.0 11/19/2021   CL 101 11/19/2021   CO2 23 08/20/2021   BUN 28 (H) 11/19/2021   CREATININE 6.50 (H) 11/19/2021   GFRNONAA 6 (L) 08/20/2021   CALCIUM 5.9 (LL) 08/20/2021   PHOS 6.6 (H) 08/15/2021   PROT 7.2 08/10/2018   ALBUMIN 2.5 (L) 08/15/2021   BILITOT 0.2 08/10/2018   ALKPHOS 73 11/02/2015   AST 17 08/10/2018   ALT 9 08/10/2018   ANIONGAP 11 08/20/2021   Last lipids Lab Results  Component Value Date   CHOL 200 (H) 08/10/2018   HDL 51 08/10/2018   LDLCALC 122 (H) 08/10/2018  TRIG 156 (H) 08/10/2018   CHOLHDL 3.9 08/10/2018   Last hemoglobin A1c Lab Results  Component Value Date   HGBA1C 10.0 (H) 08/14/2021   Last thyroid functions Lab Results  Component Value Date   TSH 2.98 04/27/2019   Last vitamin D Lab Results  Component Value Date   VD25OH 9.17 (L) 08/15/2021   Last vitamin B12 and Folate No results found for: "VITAMINB12",  "FOLATE"    The ASCVD Risk score (Arnett DK, et al., 2019) failed to calculate for the following reasons:   The valid systolic blood pressure range is 90 to 200 mmHg    Assessment & Plan:   Problem List Items Addressed This Visit       Cardiovascular and Mediastinum   Essential hypertension, benign    Hypertension well controlled, no changes needed. Follow up in 6 months      Relevant Medications   metoprolol tartrate (LOPRESSOR) 25 MG tablet     Endocrine   Uncontrolled type 2 diabetes mellitus with hyperglycemia, with long-term current use of insulin (HCC)    Dexacom meter functioning as expected, [patient has not experienced hypo glycemic or hyper glycemic episode  in the past few months.         Other   Mixed hyperlipidemia    Symptoms well controled on current medication , no changes needed. Labs completed results pending. : CBC, CMP, Lipid panel.       Relevant Medications   metoprolol tartrate (LOPRESSOR) 25 MG tablet   Obesity, Class III, BMI 40-49.9 (morbid obesity) ([place])    Patient is having more difficulty moving around without exertion. Chronic/congaing bilateral lower extremity pain, patient will benefit from an electronic scooter to help with ambulation and reduce the risk of falls/ minimize dependency.    DME for Electronic scooter completed      Relevant Orders   For home use only DME Other see comment   Other Visit Diagnoses     Other chronic pain    -  Primary   Relevant Orders   For home use only DME Other see comment       Return in about 3 months (around 05/11/2022).    Ivy Lynn, NP

## 2022-02-09 NOTE — Patient Instructions (Signed)
Hypertension, Adult Hypertension is another name for high blood pressure. High blood pressure forces your heart to work harder to pump blood. This can cause problems over time. There are two numbers in a blood pressure reading. There is a top number (systolic) over a bottom number (diastolic). It is best to have a blood pressure that is below 120/80. What are the causes? The cause of this condition is not known. Some other conditions can lead to high blood pressure. What increases the risk? Some lifestyle factors can make you more likely to develop high blood pressure: Smoking. Not getting enough exercise or physical activity. Being overweight. Having too much fat, sugar, calories, or salt (sodium) in your diet. Drinking too much alcohol. Other risk factors include: Having any of these conditions: Heart disease. Diabetes. High cholesterol. Kidney disease. Obstructive sleep apnea. Having a family history of high blood pressure and high cholesterol. Age. The risk increases with age. Stress. What are the signs or symptoms? High blood pressure may not cause symptoms. Very high blood pressure (hypertensive crisis) may cause: Headache. Fast or uneven heartbeats (palpitations). Shortness of breath. Nosebleed. Vomiting or feeling like you may vomit (nauseous). Changes in how you see. Very bad chest pain. Feeling dizzy. Seizures. How is this treated? This condition is treated by making healthy lifestyle changes, such as: Eating healthy foods. Exercising more. Drinking less alcohol. Your doctor may prescribe medicine if lifestyle changes do not help enough and if: Your top number is above 130. Your bottom number is above 80. Your personal target blood pressure may vary. Follow these instructions at home: Eating and drinking  If told, follow the DASH eating plan. To follow this plan: Fill one half of your plate at each meal with fruits and vegetables. Fill one fourth of your plate  at each meal with whole grains. Whole grains include whole-wheat pasta, brown rice, and whole-grain bread. Eat or drink low-fat dairy products, such as skim milk or low-fat yogurt. Fill one fourth of your plate at each meal with low-fat (lean) proteins. Low-fat proteins include fish, chicken without skin, eggs, beans, and tofu. Avoid fatty meat, cured and processed meat, or chicken with skin. Avoid pre-made or processed food. Limit the amount of salt in your diet to less than 1,500 mg each day. Do not drink alcohol if: Your doctor tells you not to drink. You are pregnant, may be pregnant, or are planning to become pregnant. If you drink alcohol: Limit how much you have to: 0-1 drink a day for women. 0-2 drinks a day for men. Know how much alcohol is in your drink. In the [place] one drink equals one 12 oz bottle of beer (355 mL), one 5 oz glass of wine (148 mL), or one 1 oz glass of hard liquor (44 mL). Lifestyle  Work with your doctor to stay at a healthy weight or to lose weight. Ask your doctor what the best weight is for you. Get at least 30 minutes of exercise that causes your heart to beat faster (aerobic exercise) most days of the week. This may include walking, swimming, or biking. Get at least 30 minutes of exercise that strengthens your muscles (resistance exercise) at least 3 days a week. This may include lifting weights or doing Pilates. Do not smoke or use any products that contain nicotine or tobacco. If you need help quitting, ask your doctor. Check your blood pressure at home as told by your doctor. Keep all follow-up visits. Medicines Take over-the-counter and prescription medicines   only as told by your doctor. Follow directions carefully. Do not skip doses of blood pressure medicine. The medicine does not work as well if you skip doses. Skipping doses also puts you at risk for problems. Ask your doctor about side effects or reactions to medicines that you should watch  for. Contact a doctor if: You think you are having a reaction to the medicine you are taking. You have headaches that keep coming back. You feel dizzy. You have swelling in your ankles. You have trouble with your vision. Get help right away if: You get a very bad headache. You start to feel mixed up (confused). You feel weak or numb. You feel faint. You have very bad pain in your: Chest. Belly (abdomen). You vomit more than once. You have trouble breathing. These symptoms may be an emergency. Get help right away. Call 911. Do not wait to see if the symptoms will go away. Do not drive yourself to the hospital. Summary Hypertension is another name for high blood pressure. High blood pressure forces your heart to work harder to pump blood. For most people, a normal blood pressure is less than 120/80. Making healthy choices can help lower blood pressure. If your blood pressure does not get lower with healthy choices, you may need to take medicine. This information is not intended to replace advice given to you by your health care provider. Make sure you discuss any questions you have with your health care provider. Document Revised: 12/04/2020 Document Reviewed: 12/04/2020 Elsevier Patient Education  2023 Elsevier Inc. Diabetes Insipidus Diabetes insipidus (DI) is a rare condition that causes the body to produce more urine than normal. This leads to thirst and low fluid in the body (dehydration). In this condition, the urine is made mostly of water, or dilute urine. DI affects mostly adults, but it can happen at any age. There are four types of DI: Central DI. This is the most common type. Dipsogenic DI. Nephrogenic DI. Gestational DI. The most common forms of this condition are caused by a decrease in the production of the hormone that regulates urine output (antidiuretic hormone), or the body's resistance to this hormone. This condition is not related to type 1 or type 2 diabetes  mellitus. What are the causes? Central DI is caused by damage to the pituitary gland or hypothalamus in the brain. Dipsogenic DI is caused by a defect in the thirst mechanism in the brain. This defect causes you to drink too much fluid. These may result from: Brain surgery. Infection. Inflammation. Brain tumor. Head injury. Nephrogenic DI is caused by the kidneys not responding to the antidiuretic hormone in the body. This may result from: Chronic kidney disease (CKD). Certain medicines, such as lithium. Low potassium levels. High calcium levels. Gestational DI is rare and is caused by the antidiuretic hormone that has stopped working properly. What are the signs or symptoms? Symptoms of this condition include: Excessive urination. This means urinating more than 10 cups (2.4 L) during a period of 24 hours. Excessive thirst. Too much nighttime urination (nocturia). Nausea. Diarrhea. How is this diagnosed? This condition may be diagnosed based on: Your medical history. A physical exam. Blood tests. Urine tests. A water deprivation test. During this test, you will stop drinking fluids for a period of time and your blood and urine will be checked regularly. An MRI. How is this treated? Once your specific type of diabetes insipidus is diagnosed, treatment may include one or more of the following: Increasing or   limiting your fluid intake. Taking medicines that contain artificial (synthetic) versions of the antidiuretic hormone. Stopping certain medicines that you take. Correcting the balance of minerals (electrolytes) in your body. Changing your diet. You may be put on a low-protein and low-sodium diet. You may need to visit your health care provider regularly to make sure your condition is being treated properly. You may also need to work with providers who specialize in: Kidney problems (nephrologist). Hormone disorders (endocrinologist). Follow these instructions at  home:  Eating and drinking Follow instructions from your health care provider about how much fluid and water to drink. You may be directed to drink more fluids and water, or to limit how much fluid and water you drink. Follow instructions from your health care provider about eating or drinking restrictions. General instructions Take over-the-counter and prescription medicines only as told by your health care provider. If directed, monitor your risk of dehydration in extreme heat. Carry a medical alert card or wear medical alert jewelry. Keep all follow-up visits as told by your health care provider. This is important. You may need to visit your health care provider regularly to make sure your condition is being treated properly. Contact a health care provider if: You continue to have symptoms after treatment. Get help right away if: You have extreme thirst. You have symptoms of severe dehydration, such as rapid heart rate, muscle cramps, or confusion. Summary Diabetes insipidus (DI) is a rare condition that causes the body to produce more urine than normal, which leads to thirst and dehydration. Follow instructions from your health care provider about eating or drinking restrictions. Treatment may include increasing or limiting your fluid intake and correcting the balance of minerals (electrolytes) in your body. Get help right away if you have symptoms of severe dehydration, such as rapid heart rate, muscle cramps, or confusion. This information is not intended to replace advice given to you by your health care provider. Make sure you discuss any questions you have with your health care provider. Document Revised: 07/21/2021 Document Reviewed: 03/21/2019 Elsevier Patient Education  2023 Elsevier Inc.  

## 2022-02-09 NOTE — Assessment & Plan Note (Signed)
Hypertension well controlled, no changes needed. Follow up in 6 months

## 2022-02-09 NOTE — Assessment & Plan Note (Addendum)
Patient is having more difficulty moving around without exertion. Chronic/congaing bilateral lower extremity pain, patient will benefit from an electronic scooter to help with ambulation and reduce the risk of falls/ minimize dependency.    DME for Electronic scooter completed

## 2022-02-09 NOTE — Assessment & Plan Note (Signed)
Symptoms well controled on current medication , no changes needed. Labs completed results pending. : CBC, CMP, Lipid panel.

## 2022-02-10 DIAGNOSIS — Z992 Dependence on renal dialysis: Secondary | ICD-10-CM | POA: Diagnosis not present

## 2022-02-10 DIAGNOSIS — Z23 Encounter for immunization: Secondary | ICD-10-CM | POA: Diagnosis not present

## 2022-02-10 DIAGNOSIS — N2581 Secondary hyperparathyroidism of renal origin: Secondary | ICD-10-CM | POA: Diagnosis not present

## 2022-02-10 DIAGNOSIS — N186 End stage renal disease: Secondary | ICD-10-CM | POA: Diagnosis not present

## 2022-02-11 ENCOUNTER — Other Ambulatory Visit: Payer: Self-pay | Admitting: Nurse Practitioner

## 2022-02-11 DIAGNOSIS — E782 Mixed hyperlipidemia: Secondary | ICD-10-CM

## 2022-02-11 DIAGNOSIS — Z1231 Encounter for screening mammogram for malignant neoplasm of breast: Secondary | ICD-10-CM

## 2022-02-12 ENCOUNTER — Ambulatory Visit: Payer: Medicare Other | Admitting: Nurse Practitioner

## 2022-02-12 DIAGNOSIS — Z23 Encounter for immunization: Secondary | ICD-10-CM | POA: Diagnosis not present

## 2022-02-12 DIAGNOSIS — N2581 Secondary hyperparathyroidism of renal origin: Secondary | ICD-10-CM | POA: Diagnosis not present

## 2022-02-12 DIAGNOSIS — N186 End stage renal disease: Secondary | ICD-10-CM | POA: Diagnosis not present

## 2022-02-12 DIAGNOSIS — Z992 Dependence on renal dialysis: Secondary | ICD-10-CM | POA: Diagnosis not present

## 2022-02-12 NOTE — Progress Notes (Signed)
DME faxed to [facility], OV Notes sent through [facility]

## 2022-02-15 ENCOUNTER — Telehealth: Payer: Self-pay | Admitting: Nurse Practitioner

## 2022-02-15 DIAGNOSIS — N186 End stage renal disease: Secondary | ICD-10-CM | POA: Diagnosis not present

## 2022-02-15 DIAGNOSIS — Z992 Dependence on renal dialysis: Secondary | ICD-10-CM | POA: Diagnosis not present

## 2022-02-15 DIAGNOSIS — N2581 Secondary hyperparathyroidism of renal origin: Secondary | ICD-10-CM | POA: Diagnosis not present

## 2022-02-15 DIAGNOSIS — Z23 Encounter for immunization: Secondary | ICD-10-CM | POA: Diagnosis not present

## 2022-02-15 NOTE — Telephone Encounter (Signed)
Patient is wondering if any medication she is on could cause hair loss- states her hair is falling out in clumps.

## 2022-02-16 ENCOUNTER — Ambulatory Visit
Admission: RE | Admit: 2022-02-16 | Discharge: 2022-02-16 | Disposition: A | Discharge status: Home / Self Care | Payer: Medicare Other | Source: Ambulatory Visit | Attending: Nurse Practitioner | Admitting: Nurse Practitioner

## 2022-02-16 ENCOUNTER — Encounter (INDEPENDENT_AMBULATORY_CARE_PROVIDER_SITE_OTHER): Payer: Self-pay | Admitting: *Deleted

## 2022-02-16 ENCOUNTER — Telehealth: Payer: Medicare Other

## 2022-02-16 ENCOUNTER — Telehealth: Payer: Self-pay | Admitting: Nurse Practitioner

## 2022-02-16 DIAGNOSIS — Z1231 Encounter for screening mammogram for malignant neoplasm of breast: Secondary | ICD-10-CM

## 2022-02-16 NOTE — Telephone Encounter (Signed)
Pt called back to let us know that per [employer], they do not do skooters anymore and are referring everyone to [facility]. Pt says she called [facility] to make sure they do accept orders for skooters and they still do. Can contact them at [phone].

## 2022-02-16 NOTE — Telephone Encounter (Signed)
TC to pt order was faxed to [facility] last week, I have not heard back from them that they were not able to supply this order, gave pt their # [phone] for her to call and check on the status.

## 2022-02-16 NOTE — Telephone Encounter (Signed)
Order reprinted, will place on PCPs desk for signature when she returns

## 2022-02-17 DIAGNOSIS — Z23 Encounter for immunization: Secondary | ICD-10-CM | POA: Diagnosis not present

## 2022-02-17 DIAGNOSIS — N2581 Secondary hyperparathyroidism of renal origin: Secondary | ICD-10-CM | POA: Diagnosis not present

## 2022-02-17 DIAGNOSIS — Z992 Dependence on renal dialysis: Secondary | ICD-10-CM | POA: Diagnosis not present

## 2022-02-17 DIAGNOSIS — N186 End stage renal disease: Secondary | ICD-10-CM | POA: Diagnosis not present

## 2022-02-18 ENCOUNTER — Other Ambulatory Visit: Payer: Self-pay | Admitting: Nurse Practitioner

## 2022-02-20 DIAGNOSIS — N186 End stage renal disease: Secondary | ICD-10-CM | POA: Diagnosis not present

## 2022-02-20 DIAGNOSIS — N2581 Secondary hyperparathyroidism of renal origin: Secondary | ICD-10-CM | POA: Diagnosis not present

## 2022-02-20 DIAGNOSIS — Z23 Encounter for immunization: Secondary | ICD-10-CM | POA: Diagnosis not present

## 2022-02-20 DIAGNOSIS — Z992 Dependence on renal dialysis: Secondary | ICD-10-CM | POA: Diagnosis not present

## 2022-02-20 NOTE — Telephone Encounter (Signed)
Temporary Alopecia is known to some times occur with patients who take Requip and or  beta blockers like metoprolol long term

## 2022-02-23 ENCOUNTER — Telehealth: Payer: Self-pay

## 2022-02-23 DIAGNOSIS — Z23 Encounter for immunization: Secondary | ICD-10-CM | POA: Diagnosis not present

## 2022-02-23 DIAGNOSIS — Z992 Dependence on renal dialysis: Secondary | ICD-10-CM | POA: Diagnosis not present

## 2022-02-23 DIAGNOSIS — N2581 Secondary hyperparathyroidism of renal origin: Secondary | ICD-10-CM | POA: Diagnosis not present

## 2022-02-23 DIAGNOSIS — N186 End stage renal disease: Secondary | ICD-10-CM | POA: Diagnosis not present

## 2022-02-23 NOTE — Telephone Encounter (Signed)
Attempted to call pt left vm for cb 

## 2022-02-24 ENCOUNTER — Telehealth: Payer: Self-pay | Admitting: Nurse Practitioner

## 2022-02-24 DIAGNOSIS — N186 End stage renal disease: Secondary | ICD-10-CM | POA: Diagnosis not present

## 2022-02-24 DIAGNOSIS — Z794 Long term (current) use of insulin: Secondary | ICD-10-CM | POA: Diagnosis not present

## 2022-02-24 DIAGNOSIS — Z23 Encounter for immunization: Secondary | ICD-10-CM | POA: Diagnosis not present

## 2022-02-24 DIAGNOSIS — E119 Type 2 diabetes mellitus without complications: Secondary | ICD-10-CM | POA: Diagnosis not present

## 2022-02-24 DIAGNOSIS — N2581 Secondary hyperparathyroidism of renal origin: Secondary | ICD-10-CM | POA: Diagnosis not present

## 2022-02-24 DIAGNOSIS — Z992 Dependence on renal dialysis: Secondary | ICD-10-CM | POA: Diagnosis not present

## 2022-02-25 NOTE — Telephone Encounter (Signed)
Pt aware ppw was faxed to [facility] today

## 2022-02-25 NOTE — Telephone Encounter (Signed)
Closing encounter, pt aware in another encounter ppw faxed today to [facility]

## 2022-02-26 ENCOUNTER — Telehealth: Payer: Self-pay | Admitting: Nurse Practitioner

## 2022-02-26 DIAGNOSIS — N186 End stage renal disease: Secondary | ICD-10-CM | POA: Diagnosis not present

## 2022-02-26 DIAGNOSIS — Z992 Dependence on renal dialysis: Secondary | ICD-10-CM | POA: Diagnosis not present

## 2022-02-26 DIAGNOSIS — N2581 Secondary hyperparathyroidism of renal origin: Secondary | ICD-10-CM | POA: Diagnosis not present

## 2022-02-26 DIAGNOSIS — Z23 Encounter for immunization: Secondary | ICD-10-CM | POA: Diagnosis not present

## 2022-02-26 NOTE — Telephone Encounter (Signed)
Order and OV notes faxed to [facility]

## 2022-02-28 DIAGNOSIS — N186 End stage renal disease: Secondary | ICD-10-CM | POA: Diagnosis not present

## 2022-02-28 DIAGNOSIS — Z992 Dependence on renal dialysis: Secondary | ICD-10-CM | POA: Diagnosis not present

## 2022-03-01 DIAGNOSIS — N2581 Secondary hyperparathyroidism of renal origin: Secondary | ICD-10-CM | POA: Diagnosis not present

## 2022-03-01 DIAGNOSIS — Z992 Dependence on renal dialysis: Secondary | ICD-10-CM | POA: Diagnosis not present

## 2022-03-01 DIAGNOSIS — N186 End stage renal disease: Secondary | ICD-10-CM | POA: Diagnosis not present

## 2022-03-03 DIAGNOSIS — N2581 Secondary hyperparathyroidism of renal origin: Secondary | ICD-10-CM | POA: Diagnosis not present

## 2022-03-03 DIAGNOSIS — Z992 Dependence on renal dialysis: Secondary | ICD-10-CM | POA: Diagnosis not present

## 2022-03-03 DIAGNOSIS — N186 End stage renal disease: Secondary | ICD-10-CM | POA: Diagnosis not present

## 2022-03-04 ENCOUNTER — Ambulatory Visit (INDEPENDENT_AMBULATORY_CARE_PROVIDER_SITE_OTHER): Payer: Medicaid Other | Admitting: Pharmacist

## 2022-03-04 ENCOUNTER — Telehealth: Payer: Self-pay

## 2022-03-04 VITALS — BP 135/73

## 2022-03-04 DIAGNOSIS — E1022 Type 1 diabetes mellitus with diabetic chronic kidney disease: Secondary | ICD-10-CM

## 2022-03-04 DIAGNOSIS — I10 Essential (primary) hypertension: Secondary | ICD-10-CM

## 2022-03-04 NOTE — Telephone Encounter (Signed)
Attempted to call pt again no answer - lab letter has been sent

## 2022-03-05 DIAGNOSIS — Z992 Dependence on renal dialysis: Secondary | ICD-10-CM | POA: Diagnosis not present

## 2022-03-05 DIAGNOSIS — N186 End stage renal disease: Secondary | ICD-10-CM | POA: Diagnosis not present

## 2022-03-05 DIAGNOSIS — N2581 Secondary hyperparathyroidism of renal origin: Secondary | ICD-10-CM | POA: Diagnosis not present

## 2022-03-08 DIAGNOSIS — N2581 Secondary hyperparathyroidism of renal origin: Secondary | ICD-10-CM | POA: Diagnosis not present

## 2022-03-08 DIAGNOSIS — Z992 Dependence on renal dialysis: Secondary | ICD-10-CM | POA: Diagnosis not present

## 2022-03-08 DIAGNOSIS — N186 End stage renal disease: Secondary | ICD-10-CM | POA: Diagnosis not present

## 2022-03-10 DIAGNOSIS — Z992 Dependence on renal dialysis: Secondary | ICD-10-CM | POA: Diagnosis not present

## 2022-03-10 DIAGNOSIS — N2581 Secondary hyperparathyroidism of renal origin: Secondary | ICD-10-CM | POA: Diagnosis not present

## 2022-03-10 DIAGNOSIS — N186 End stage renal disease: Secondary | ICD-10-CM | POA: Diagnosis not present

## 2022-03-11 NOTE — Progress Notes (Signed)
Chronic Care Management Pharmacy Note  03/04/2022 Name:  Sally Porter MRN:  [id number] DOB:  1980/11/28  Summary:  Diabetes: Goal on Track (progressing): YES. Uncontrolled; current treatment: basal/bolus--patient is ESRD on HD MWF  Patient's most recent A1c in 10% in June 2023--will recheck in 1 month Reinforced basal/bolus compliance and dietary restrictions Stable on Dexcom G7 CGM Gets filled at [facility]  Patient to use her smartphone as reader  Current glucose readings: fasting glucose: <130, TIR 73% Denies/reports hypoglycemic/hyperglycemic symptoms Current meal patterns: Must follow renal/diabetes dietary recommendations Current exercise: N/A; ESRD HD MWF Educated on continue DEXCOM G7--APPLY SENSOR TO ARM EVERY 10 DAYS; WILL USE SMART PHONE AS READER Plan to adjust regimen based on new CGM readings Reviewed medications for hypertension and hyperlipidemia  HYPERTENSION: -will continue to address -stable on current regimen -BP checked 3x weekly at dialysis -check on home monitoring status -most recent reported 135/75 controlled/documented for true [place] metric and patient outcomes  Patient Goals/Self-Care Activities patient will:  - take medications as prescribed as evidenced by patient report and record review check glucose utilizing new dexcom g7 cgm, document, and provide at future appointments target a minimum of 150 minutes of moderate intensity exercise weekly engage in dietary modifications by renal/diabetes dietary recommendations   Subjective: Sally Porter is an 42 y.o. year old female who is a primary patient of Rakes, Connye Burkitt, FNP.  The patient was referred to the Chronic Care Management team for assistance with care management needs subsequent to provider initiation of CCM services and plan of care.    Engaged with patient by telephone for follow up visit in response to provider referral for CCM services.   Objective:  LABS:    Lab Results   Component Value Date   CREATININE 6.50 (H) 11/19/2021   CREATININE 8.62 (H) 08/20/2021   CREATININE 10.60 (H) 08/18/2021     Lab Results  Component Value Date   HGBA1C 10.0 (H) 08/14/2021         Component Value Date/Time   CHOL 200 (H) 08/10/2018 1013   TRIG 156 (H) 08/10/2018 1013   HDL 51 08/10/2018 1013   CHOLHDL 3.9 08/10/2018 1013   LDLCALC 122 (H) 08/10/2018 1013     Clinical ASCVD: No   The 10-year ASCVD risk score (Arnett DK, et al., 2019) is: 3.6%   Values used to calculate the score:     Age: 76 years     Sex: Female     Is Non-Hispanic African American: Yes     Diabetic: Yes     Tobacco smoker: No     Systolic Blood Pressure: 403 mmHg     Is BP treated: Yes     HDL Cholesterol: 52 MG/DL     Total Cholesterol: 130 MG/DL    Other: (CHADS2VASc if Afib, PHQ9 if depression, MMRC or CAT for COPD, ACT, DEXA)    BP Readings from Last 3 Encounters:  03/04/22 135/73  02/09/22 (!) 202/76  01/26/22 (!) 157/76      SDOH:  (Social Determinants of Health) assessments and interventions performed:    Allergies  Allergen Reactions   Tylenol [Acetaminophen] Other (See Comments)    Patient stated she does not take due to her kidney function.   Dilaudid [Hydromorphone] Itching   Lyrica [Pregabalin] Itching   Morphine And Related Itching    Medications Reviewed Today     Reviewed by Lavera Guise, [facility] (Pharmacist) on 03/04/22 at 1011  Med List Status: <None>  Medication Order Taking? Sig Documenting Provider Last Dose Status Informant  albuterol (VENTOLIN HFA) 108 (90 Base) MCG/ACT inhaler [id number] No Inhale 2 puffs into the lungs every 6 (six) hours as needed for wheezing or shortness of breath. Ivy Lynn, NP Taking Active Self  ALPRAZolam Duanne Moron) 0.5 MG tablet [id number] No Take 0.5 mg by mouth 2 (two) times daily. [provider] Taking Active Self  atorvastatin (LIPITOR) 20 MG tablet [id number]  TAKE 1 TABLET BY MOUTH DAILY AT Nelia Shi, NP  Active   calcitRIOL (ROCALTROL) 0.5 MCG capsule [id number] No TAKE ONE CAPSULE BY MOUTH DAILY Ivy Lynn, NP Taking Active Self           Med Note Blanca Friend, [facility] D   Thu Mar 04, 2022 10:00 AM) Holding per nephro   calcium acetate (PHOSLO) 667 MG capsule [id number]  TAKE TWO CAPSULES BY MOUTH THREE TIMES DAILY WITH meals Ivy Lynn, NP  Active   Continuous Blood Gluc Sensor (DEXCOM G7 SENSOR) MISC [id number] No Apply to arm every 10 days to test blood sugar continuously. DX: E11.65 Ivy Lynn, NP Taking Active Self  ferrous sulfate 325 (65 FE) MG tablet [id number] No Take 325 mg by mouth daily with breakfast. [provider] Taking Active Self  hydrALAZINE (APRESOLINE) 50 MG tablet [id number]  TAKE 1 TABLET BY MOUTH EVERY 8 HOURS Ivy Lynn, NP  Active   insulin glargine (LANTUS SOLOSTAR) 100 UNIT/ML Solostar Pen [id number] No Inject 35 Units into the skin at bedtime. Ivy Lynn, NP Taking Active Self  insulin lispro (HUMALOG KWIKPEN) 100 UNIT/ML KwikPen [id number] No Inject 0-15 Units into the skin 3 (three) times daily. Sliding scale Ivy Lynn, NP Taking Active   Insulin Pen Needle 32G X 4 MM MISC [id number] No Use with insulin TID Dx E11.65 Ivy Lynn, NP Taking Active Self  metoprolol tartrate (LOPRESSOR) 25 MG tablet [id number] No Take 25 mg by mouth 2 (two) times daily. [provider] Taking Active   nortriptyline (PAMELOR) 25 MG capsule [id number] No Take 25 mg by mouth 2 (two) times daily. [provider] Taking Active Self  Oxycodone HCl 10 MG TABS [id number] No Take 10 mg by mouth every 6 (six) hours. [provider] Taking Active Self  polyethylene glycol (MIRALAX / GLYCOLAX) 17 g packet [id number] No Take 17 g by mouth daily. Shelly Coss, MD Taking Active Self  rOPINIRole (REQUIP) 0.5 MG tablet [id number] No Take 0.5 mg by mouth at bedtime. [provider] Taking Active Self  senna-docusate (SENOKOT-S)  8.6-50 MG tablet [id number] No Take 1 tablet by mouth 2 (two) times daily. Shelly Coss, MD Taking Active Self  zinc gluconate 50 MG tablet [id number] No Take 50 mg by mouth daily. [provider] Taking Active   Med List Note Jeannette How 10/29/21 1053): Dialysis M-W-F              Goals Addressed               This Visit's Progress     Patient Stated     Diabetes, HTN, HLD (pt-stated)        Current Barriers:  Unable to independently monitor therapeutic efficacy  Pharmacist Clinical Goal(s):  patient will achieve adherence to monitoring guidelines and medication adherence to achieve therapeutic efficacy through collaboration with PharmD and provider.   Interventions: 1:1 collaboration with Ivy Lynn, NP regarding development and update of comprehensive plan of care as  evidenced by provider attestation and co-signature Inter-disciplinary care team collaboration (see longitudinal plan of care) Comprehensive medication review performed; medication list updated in electronic medical record  Diabetes: Goal on Track (progressing): YES. Uncontrolled; current treatment: basal/bolus--patient is ESRD on HD MWF  Patient's most recent A1c in 10% in June 2023--will recheck in 1 month Reinforced basal/bolus compliance and dietary restrictions Stable on Dexcom G7 CGM Gets filled at [place]  Patient to use her smartphone as reader  Current glucose readings: fasting glucose: <130, TIR 73% Denies/reports hypoglycemic/hyperglycemic symptoms Current meal patterns: Must follow renal/diabetes dietary recommendations Current exercise: N/A; ESRD HD MWF Educated on continue DEXCOM G7--APPLY SENSOR TO ARM EVERY 10 DAYS; WILL USE SMART PHONE AS READER Plan to adjust regimen based on new CGM readings Reviewed medications for hypertension and hyperlipidemia  HYPERTENSION: -will continue to address -stable on current regimen -BP checked 3x weekly at  dialysis -check on home monitoring status -most recent reported 135/75 controlled/documented for true [place] metric and patient outcomes  Patient Goals/Self-Care Activities patient will:  - take medications as prescribed as evidenced by patient report and record review check glucose utilizing new dexcom g7 cgm, document, and provide at future appointments target a minimum of 150 minutes of moderate intensity exercise weekly engage in dietary modifications by renal/diabetes dietary recommendations         Plan: Telephone follow up appointment with care management team member scheduled for:  1 month      Regina Eck, PharmD, BCPS, BCACP Clinical Pharmacist, [place]  II  T [phone]

## 2022-03-12 DIAGNOSIS — N2581 Secondary hyperparathyroidism of renal origin: Secondary | ICD-10-CM | POA: Diagnosis not present

## 2022-03-12 DIAGNOSIS — Z992 Dependence on renal dialysis: Secondary | ICD-10-CM | POA: Diagnosis not present

## 2022-03-12 DIAGNOSIS — N186 End stage renal disease: Secondary | ICD-10-CM | POA: Diagnosis not present

## 2022-03-15 ENCOUNTER — Other Ambulatory Visit: Payer: Self-pay | Admitting: Nurse Practitioner

## 2022-03-15 DIAGNOSIS — N2581 Secondary hyperparathyroidism of renal origin: Secondary | ICD-10-CM | POA: Diagnosis not present

## 2022-03-15 DIAGNOSIS — N186 End stage renal disease: Secondary | ICD-10-CM | POA: Diagnosis not present

## 2022-03-15 DIAGNOSIS — Z992 Dependence on renal dialysis: Secondary | ICD-10-CM | POA: Diagnosis not present

## 2022-03-17 DIAGNOSIS — Z992 Dependence on renal dialysis: Secondary | ICD-10-CM | POA: Diagnosis not present

## 2022-03-17 DIAGNOSIS — N186 End stage renal disease: Secondary | ICD-10-CM | POA: Diagnosis not present

## 2022-03-17 DIAGNOSIS — N2581 Secondary hyperparathyroidism of renal origin: Secondary | ICD-10-CM | POA: Diagnosis not present

## 2022-03-19 DIAGNOSIS — N2581 Secondary hyperparathyroidism of renal origin: Secondary | ICD-10-CM | POA: Diagnosis not present

## 2022-03-19 DIAGNOSIS — Z992 Dependence on renal dialysis: Secondary | ICD-10-CM | POA: Diagnosis not present

## 2022-03-19 DIAGNOSIS — N186 End stage renal disease: Secondary | ICD-10-CM | POA: Diagnosis not present

## 2022-03-22 DIAGNOSIS — N186 End stage renal disease: Secondary | ICD-10-CM | POA: Diagnosis not present

## 2022-03-22 DIAGNOSIS — N2581 Secondary hyperparathyroidism of renal origin: Secondary | ICD-10-CM | POA: Diagnosis not present

## 2022-03-22 DIAGNOSIS — Z992 Dependence on renal dialysis: Secondary | ICD-10-CM | POA: Diagnosis not present

## 2022-03-24 DIAGNOSIS — N186 End stage renal disease: Secondary | ICD-10-CM | POA: Diagnosis not present

## 2022-03-24 DIAGNOSIS — N2581 Secondary hyperparathyroidism of renal origin: Secondary | ICD-10-CM | POA: Diagnosis not present

## 2022-03-24 DIAGNOSIS — Z992 Dependence on renal dialysis: Secondary | ICD-10-CM | POA: Diagnosis not present

## 2022-03-26 ENCOUNTER — Other Ambulatory Visit: Payer: Self-pay | Admitting: Family Medicine

## 2022-03-26 DIAGNOSIS — N186 End stage renal disease: Secondary | ICD-10-CM | POA: Diagnosis not present

## 2022-03-26 DIAGNOSIS — Z992 Dependence on renal dialysis: Secondary | ICD-10-CM | POA: Diagnosis not present

## 2022-03-26 DIAGNOSIS — N2581 Secondary hyperparathyroidism of renal origin: Secondary | ICD-10-CM | POA: Diagnosis not present

## 2022-03-29 DIAGNOSIS — N186 End stage renal disease: Secondary | ICD-10-CM | POA: Diagnosis not present

## 2022-03-29 DIAGNOSIS — N2581 Secondary hyperparathyroidism of renal origin: Secondary | ICD-10-CM | POA: Diagnosis not present

## 2022-03-29 DIAGNOSIS — Z992 Dependence on renal dialysis: Secondary | ICD-10-CM | POA: Diagnosis not present

## 2022-03-31 DIAGNOSIS — N186 End stage renal disease: Secondary | ICD-10-CM | POA: Diagnosis not present

## 2022-03-31 DIAGNOSIS — E1022 Type 1 diabetes mellitus with diabetic chronic kidney disease: Secondary | ICD-10-CM

## 2022-03-31 DIAGNOSIS — I10 Essential (primary) hypertension: Secondary | ICD-10-CM

## 2022-03-31 DIAGNOSIS — N185 Chronic kidney disease, stage 5: Secondary | ICD-10-CM

## 2022-03-31 DIAGNOSIS — N2581 Secondary hyperparathyroidism of renal origin: Secondary | ICD-10-CM | POA: Diagnosis not present

## 2022-03-31 DIAGNOSIS — Z992 Dependence on renal dialysis: Secondary | ICD-10-CM | POA: Diagnosis not present

## 2022-04-02 DIAGNOSIS — N186 End stage renal disease: Secondary | ICD-10-CM | POA: Diagnosis not present

## 2022-04-02 DIAGNOSIS — N2581 Secondary hyperparathyroidism of renal origin: Secondary | ICD-10-CM | POA: Diagnosis not present

## 2022-04-02 DIAGNOSIS — Z992 Dependence on renal dialysis: Secondary | ICD-10-CM | POA: Diagnosis not present

## 2022-04-05 DIAGNOSIS — N2581 Secondary hyperparathyroidism of renal origin: Secondary | ICD-10-CM | POA: Diagnosis not present

## 2022-04-05 DIAGNOSIS — N186 End stage renal disease: Secondary | ICD-10-CM | POA: Diagnosis not present

## 2022-04-05 DIAGNOSIS — Z992 Dependence on renal dialysis: Secondary | ICD-10-CM | POA: Diagnosis not present

## 2022-04-06 ENCOUNTER — Ambulatory Visit (INDEPENDENT_AMBULATORY_CARE_PROVIDER_SITE_OTHER): Payer: 59 | Admitting: Pharmacist

## 2022-04-06 VITALS — BP 132/78

## 2022-04-06 DIAGNOSIS — Z794 Long term (current) use of insulin: Secondary | ICD-10-CM | POA: Diagnosis not present

## 2022-04-06 DIAGNOSIS — E1165 Type 2 diabetes mellitus with hyperglycemia: Secondary | ICD-10-CM

## 2022-04-06 NOTE — Progress Notes (Signed)
S:    Chief Complaint  Patient presents with   Diabetes   42 y.o. female who presents for diabetes evaluation, education, and management. PMH is significant for T1DM and ESRD MWF Patient was last seen by pharmacy clinic on 03/04/2022.   Today, patient is in good spirits. Reports some nocturnal lows during dialysis days. She notes that she tends to eat less on dialysis days and 35 units of Lantus in the morning will cause her to be low later in the day. She also reports fluctuating SBP, ranging from 130s to 150-160s mmHg.    Patient reports Diabetes was diagnosed 30 years ago. Has been on HD since June 2023. Currently awaiting both a kidney and pancreas transplant. Reports she has recently been moved up the kidney transplant list.   Current diabetes medications include: Lantus 35 units, Humalog 15-18 units TID AC Current hypertension medications include: metoprolol tartrate 25 mg BID, hydralazine 50 mg TID Current hyperlipidemia medications include: atorvastatin 20 mg daily  Patient reports adherence to taking all medications as prescribed.   Insurance coverage: UHC Medicare + Medicaid   Patient reports hypoglycemic events on HD days.  Patient reported dietary habits: Will typically skip lunch on HD days Dinner: salad, baked chicken Snacks available at HD- protein bars   O:  CGM Data:  Glucose Management Indicator: 7.5%  Time in Goal:  - Time in range 70-180: 63% - Time above range: 23% - Time below range: 4%  Lab Results  Component Value Date   HGBA1C 10.0 (H) 08/14/2021   There were no vitals filed for this visit.  Lipid Panel     Component Value Date/Time   CHOL 200 (H) 08/10/2018 1013   TRIG 156 (H) 08/10/2018 1013   HDL 51 08/10/2018 1013   CHOLHDL 3.9 08/10/2018 1013   LDLCALC 122 (H) 08/10/2018 1013    Clinical Atherosclerotic Cardiovascular Disease (ASCVD): No  The 10-year ASCVD risk score (Arnett DK, et al., 2019) is: 8.9%   Values used to calculate  the score:     Age: 36 years     Sex: Female     Is Non-Hispanic African American: Yes     Diabetic: Yes     Tobacco smoker: No     Systolic Blood Pressure: 0000000 mmHg     Is BP treated: Yes     HDL Cholesterol: 52 MG/DL     Total Cholesterol: 130 MG/DL   A/P: Diabetes currently close to goal based on Dexcom CGM GMI (7.5%). Patient is able to verbalize appropriate hypoglycemia management plan. Medication adherence appears appropriate. Patient having issues with hypoglycemia on HD days.  -Change Lantus to 30 units on HD days and 35 units all other days.  -Continued rapid insulin Humalog 15-18 units TID.  -Patient educated on purpose, proper use, and potential adverse effects of insulin.  -Extensively discussed pathophysiology of diabetes, recommended lifestyle interventions, dietary effects on blood sugar control.  -Connected patient to [place] on Dexcom Clarity  -Counseled on s/sx of and management of hypoglycemia.  -Next A1c anticipated at PCP visit on 04/22/2022.   Hypertension, longstanding, currently uncontrolled per patient report. States her SBP has been fluctuating, from 130s to 150s-160s. Adherent to all medications.  -Suggested patient speak to nephrologist about her BP concerns at dialysis tomorrow.   Patient verbalized understanding of treatment plan.  Total time counseling 30 minutes.    Follow-up:  Pharmacist: PRN PCP clinic visit in February 2024.   Joseph Art, Pharm.D. PGY-2  Ambulatory Care Pharmacy Resident 04/06/2022 5:14 PM  Regina Eck, PharmD, BCPS, BCACP Clinical Pharmacist, [place]  II  T [phone]

## 2022-04-07 ENCOUNTER — Telehealth: Payer: Self-pay | Admitting: Family Medicine

## 2022-04-07 DIAGNOSIS — N186 End stage renal disease: Secondary | ICD-10-CM | POA: Diagnosis not present

## 2022-04-07 DIAGNOSIS — N2581 Secondary hyperparathyroidism of renal origin: Secondary | ICD-10-CM | POA: Diagnosis not present

## 2022-04-07 DIAGNOSIS — Z992 Dependence on renal dialysis: Secondary | ICD-10-CM | POA: Diagnosis not present

## 2022-04-07 NOTE — Telephone Encounter (Signed)
Patient aware will need apt since she can not take anything with tylenol in it. States she will call back.

## 2022-04-08 ENCOUNTER — Other Ambulatory Visit: Payer: Self-pay | Admitting: Family Medicine

## 2022-04-09 DIAGNOSIS — N2581 Secondary hyperparathyroidism of renal origin: Secondary | ICD-10-CM | POA: Diagnosis not present

## 2022-04-09 DIAGNOSIS — Z992 Dependence on renal dialysis: Secondary | ICD-10-CM | POA: Diagnosis not present

## 2022-04-09 DIAGNOSIS — N186 End stage renal disease: Secondary | ICD-10-CM | POA: Diagnosis not present

## 2022-04-12 DIAGNOSIS — N186 End stage renal disease: Secondary | ICD-10-CM | POA: Diagnosis not present

## 2022-04-12 DIAGNOSIS — Z992 Dependence on renal dialysis: Secondary | ICD-10-CM | POA: Diagnosis not present

## 2022-04-12 DIAGNOSIS — N2581 Secondary hyperparathyroidism of renal origin: Secondary | ICD-10-CM | POA: Diagnosis not present

## 2022-04-13 DIAGNOSIS — Z79899 Other long term (current) drug therapy: Secondary | ICD-10-CM | POA: Diagnosis not present

## 2022-04-13 DIAGNOSIS — Z79891 Long term (current) use of opiate analgesic: Secondary | ICD-10-CM | POA: Diagnosis not present

## 2022-04-13 DIAGNOSIS — M25511 Pain in right shoulder: Secondary | ICD-10-CM | POA: Diagnosis not present

## 2022-04-13 DIAGNOSIS — M25572 Pain in left ankle and joints of left foot: Secondary | ICD-10-CM | POA: Diagnosis not present

## 2022-04-13 DIAGNOSIS — M25512 Pain in left shoulder: Secondary | ICD-10-CM | POA: Diagnosis not present

## 2022-04-13 DIAGNOSIS — G894 Chronic pain syndrome: Secondary | ICD-10-CM | POA: Diagnosis not present

## 2022-04-13 DIAGNOSIS — M25571 Pain in right ankle and joints of right foot: Secondary | ICD-10-CM | POA: Diagnosis not present

## 2022-04-14 DIAGNOSIS — N2581 Secondary hyperparathyroidism of renal origin: Secondary | ICD-10-CM | POA: Diagnosis not present

## 2022-04-14 DIAGNOSIS — Z992 Dependence on renal dialysis: Secondary | ICD-10-CM | POA: Diagnosis not present

## 2022-04-14 DIAGNOSIS — N186 End stage renal disease: Secondary | ICD-10-CM | POA: Diagnosis not present

## 2022-04-15 ENCOUNTER — Encounter: Payer: Self-pay | Admitting: Pharmacist

## 2022-04-16 DIAGNOSIS — Z992 Dependence on renal dialysis: Secondary | ICD-10-CM | POA: Diagnosis not present

## 2022-04-16 DIAGNOSIS — N2581 Secondary hyperparathyroidism of renal origin: Secondary | ICD-10-CM | POA: Diagnosis not present

## 2022-04-16 DIAGNOSIS — N186 End stage renal disease: Secondary | ICD-10-CM | POA: Diagnosis not present

## 2022-04-17 ENCOUNTER — Other Ambulatory Visit: Payer: Self-pay | Admitting: Family Medicine

## 2022-04-19 DIAGNOSIS — Z992 Dependence on renal dialysis: Secondary | ICD-10-CM | POA: Diagnosis not present

## 2022-04-19 DIAGNOSIS — N2581 Secondary hyperparathyroidism of renal origin: Secondary | ICD-10-CM | POA: Diagnosis not present

## 2022-04-19 DIAGNOSIS — N186 End stage renal disease: Secondary | ICD-10-CM | POA: Diagnosis not present

## 2022-04-20 ENCOUNTER — Telehealth: Payer: Self-pay | Admitting: Family Medicine

## 2022-04-21 DIAGNOSIS — N186 End stage renal disease: Secondary | ICD-10-CM | POA: Diagnosis not present

## 2022-04-21 DIAGNOSIS — Z992 Dependence on renal dialysis: Secondary | ICD-10-CM | POA: Diagnosis not present

## 2022-04-21 DIAGNOSIS — N2581 Secondary hyperparathyroidism of renal origin: Secondary | ICD-10-CM | POA: Diagnosis not present

## 2022-04-22 ENCOUNTER — Ambulatory Visit (INDEPENDENT_AMBULATORY_CARE_PROVIDER_SITE_OTHER): Payer: Medicare Other | Admitting: Gastroenterology

## 2022-04-22 ENCOUNTER — Encounter (INDEPENDENT_AMBULATORY_CARE_PROVIDER_SITE_OTHER): Payer: Self-pay | Admitting: Gastroenterology

## 2022-04-23 DIAGNOSIS — N186 End stage renal disease: Secondary | ICD-10-CM | POA: Diagnosis not present

## 2022-04-23 DIAGNOSIS — N2581 Secondary hyperparathyroidism of renal origin: Secondary | ICD-10-CM | POA: Diagnosis not present

## 2022-04-23 DIAGNOSIS — Z992 Dependence on renal dialysis: Secondary | ICD-10-CM | POA: Diagnosis not present

## 2022-04-26 DIAGNOSIS — Z992 Dependence on renal dialysis: Secondary | ICD-10-CM | POA: Diagnosis not present

## 2022-04-26 DIAGNOSIS — N186 End stage renal disease: Secondary | ICD-10-CM | POA: Diagnosis not present

## 2022-04-26 DIAGNOSIS — N2581 Secondary hyperparathyroidism of renal origin: Secondary | ICD-10-CM | POA: Diagnosis not present

## 2022-04-27 ENCOUNTER — Encounter (INDEPENDENT_AMBULATORY_CARE_PROVIDER_SITE_OTHER): Payer: Self-pay | Admitting: *Deleted

## 2022-04-28 DIAGNOSIS — N2581 Secondary hyperparathyroidism of renal origin: Secondary | ICD-10-CM | POA: Diagnosis not present

## 2022-04-28 DIAGNOSIS — Z992 Dependence on renal dialysis: Secondary | ICD-10-CM | POA: Diagnosis not present

## 2022-04-28 DIAGNOSIS — N186 End stage renal disease: Secondary | ICD-10-CM | POA: Diagnosis not present

## 2022-04-29 ENCOUNTER — Other Ambulatory Visit: Payer: Self-pay | Admitting: Family Medicine

## 2022-04-29 ENCOUNTER — Ambulatory Visit: Payer: 59 | Admitting: Family Medicine

## 2022-04-29 DIAGNOSIS — Z992 Dependence on renal dialysis: Secondary | ICD-10-CM | POA: Diagnosis not present

## 2022-04-29 DIAGNOSIS — N186 End stage renal disease: Secondary | ICD-10-CM | POA: Diagnosis not present

## 2022-04-29 DIAGNOSIS — G894 Chronic pain syndrome: Secondary | ICD-10-CM | POA: Diagnosis not present

## 2022-04-29 DIAGNOSIS — E782 Mixed hyperlipidemia: Secondary | ICD-10-CM

## 2022-04-30 DIAGNOSIS — N2581 Secondary hyperparathyroidism of renal origin: Secondary | ICD-10-CM | POA: Diagnosis not present

## 2022-04-30 DIAGNOSIS — Z992 Dependence on renal dialysis: Secondary | ICD-10-CM | POA: Diagnosis not present

## 2022-04-30 DIAGNOSIS — N186 End stage renal disease: Secondary | ICD-10-CM | POA: Diagnosis not present

## 2022-05-03 ENCOUNTER — Telehealth: Payer: Self-pay | Admitting: Family Medicine

## 2022-05-03 ENCOUNTER — Telehealth (INDEPENDENT_AMBULATORY_CARE_PROVIDER_SITE_OTHER): Payer: 59 | Admitting: Nurse Practitioner

## 2022-05-03 ENCOUNTER — Encounter: Payer: Self-pay | Admitting: Nurse Practitioner

## 2022-05-03 DIAGNOSIS — H9201 Otalgia, right ear: Secondary | ICD-10-CM

## 2022-05-03 DIAGNOSIS — N2581 Secondary hyperparathyroidism of renal origin: Secondary | ICD-10-CM | POA: Diagnosis not present

## 2022-05-03 DIAGNOSIS — Z992 Dependence on renal dialysis: Secondary | ICD-10-CM | POA: Diagnosis not present

## 2022-05-03 DIAGNOSIS — N186 End stage renal disease: Secondary | ICD-10-CM | POA: Diagnosis not present

## 2022-05-03 MED ORDER — FLUTICASONE PROPIONATE 50 MCG/ACT NA SUSP: 2.0000 | Freq: Every day | NASAL | 6 refills | Status: DC

## 2022-05-03 MED ORDER — AMOXICILLIN-POT CLAVULANATE 875-125 MG PO TABS: 1.0000 | ORAL_TABLET | Freq: Two times a day (BID) | ORAL | 0 refills | Status: DC

## 2022-05-03 NOTE — Progress Notes (Signed)
Virtual Visit Consent   Sally Porter, you are scheduled for a virtual visit with [name], [place], a Porter provider, today.     Just as with appointments in the office, your consent must be obtained to participate.  Your consent will be active for this visit and any virtual visit you may have with one of our providers in the next 365 days.     If you have a MyChart account, a copy of this consent can be sent to you electronically.  All virtual visits are billed to your insurance company just like a traditional visit in the office.    As this is a virtual visit, video technology does not allow for your provider to perform a traditional examination.  This may limit your provider's ability to fully assess your condition.  If your provider identifies any concerns that need to be evaluated in person or the need to arrange testing (such as labs, EKG, etc.), we will make arrangements to do so.     Although advances in technology are sophisticated, we cannot ensure that it will always work on either your end or our end.  If the connection with a video visit is poor, the visit may have to be switched to a telephone visit.  With either a video or telephone visit, we are not always able to ensure that we have a secure connection.     I need to obtain your verbal consent now.   Are you willing to proceed with your visit today? YES   Sally Porter has provided verbal consent on 05/03/2022 for a virtual visit (video or telephone).   [name], FNP   Date: 05/03/2022 8:36 AM   Virtual Visit via Video Note   I, [name], connected with Sally Porter ([id number], 29-May-1980) on 05/03/22 at  3:20 PM EST by a video-enabled telemedicine application and verified that I am speaking with the correct person using two identifiers.  Location: Patient: Virtual Visit Location Patient: Home Provider: Virtual Visit Location Provider: Mobile   I discussed the limitations  of evaluation and management by telemedicine and the availability of in person appointments. The patient expressed understanding and agreed to proceed.    History of Present Illness: Sally Porter is a 42 y.o. who identifies as a female who was assigned female at birth, and is being seen today for otalgia.  HPI: Otalgia  There is pain in the right ear. This is a new problem. The current episode started in the past 7 days. The problem occurs constantly. The problem has been waxing and waning. There has been no fever. The pain is at a severity of 8/10. The pain is moderate. Associated symptoms include coughing and rhinorrhea. Pertinent negatives include no ear discharge. She has tried acetaminophen for the symptoms. The treatment provided mild relief. Her past medical history is significant for a chronic ear infection.    Review of Systems  HENT:  Positive for ear pain and rhinorrhea. Negative for ear discharge.   Respiratory:  Positive for cough.     Problems:  Patient Active Problem List   Diagnosis Date Noted   Acute renal failure superimposed on stage 4 chronic kidney disease ([place]) 08/14/2021   Intractable nausea and vomiting 08/14/2021   Obesity, Class III, BMI 40-49.9 (morbid obesity) ([place]) 08/14/2021   Opioid dependence ([place]) 08/14/2021   Uncontrolled type 2 diabetes mellitus with hyperglycemia, with long-term current use of insulin ([place]) 08/14/2021   Type 1 diabetes mellitus with stage  5 chronic kidney disease not on chronic dialysis ([place]) 05/01/2019   Mixed hyperlipidemia 05/01/2019   Essential hypertension, benign 05/01/2019   Vitamin D deficiency 05/01/2019   Personal history of noncompliance with medical treatment, presenting hazards to health 05/01/2019   Hyperkalemia 05/01/2019    Allergies:  Allergies  Allergen Reactions   Tylenol [Acetaminophen] Other (See Comments)    Patient stated she does not take due to her kidney function.   Dilaudid [Hydromorphone] Itching    Lyrica [Pregabalin] Itching   Morphine And Related Itching   Medications:  Current Outpatient Medications:    albuterol (VENTOLIN HFA) 108 (90 Base) MCG/ACT inhaler, Inhale 2 puffs into the lungs every 6 (six) hours as needed for wheezing or shortness of breath., Disp: 8 g, Rfl: 5   ALPRAZolam (XANAX) 0.5 MG tablet, Take 0.5 mg by mouth 2 (two) times daily., Disp: , Rfl:    atorvastatin (LIPITOR) 20 MG tablet, TAKE 1 TABLET BY MOUTH DAILY AT 6PM, Disp: 30 tablet, Rfl: 0   calcitRIOL (ROCALTROL) 0.5 MCG capsule, TAKE ONE CAPSULE BY MOUTH DAILY, Disp: 30 capsule, Rfl: 0   calcium acetate (PHOSLO) 667 MG capsule, TAKE TWO CAPSULES BY MOUTH THREE TIMES DAILY WITH meals, Disp: 180 capsule, Rfl: 0   Continuous Blood Gluc Sensor (DEXCOM G7 SENSOR) MISC, Apply to arm every 10 days to test blood sugar continuously. DX: E11.65, Disp: 3 each, Rfl: 11   ferrous sulfate 325 (65 FE) MG tablet, Take 325 mg by mouth daily with breakfast., Disp: , Rfl:    hydrALAZINE (APRESOLINE) 50 MG tablet, TAKE 1 TABLET BY MOUTH EVERY 8 HOURS, Disp: 90 tablet, Rfl: 0   insulin glargine (LANTUS SOLOSTAR) 100 UNIT/ML Solostar Pen, INJECT 35 UNITS INTO THE SKIN AT BEDTIME, Disp: 30 mL, Rfl: 0   insulin lispro (HUMALOG KWIKPEN) 100 UNIT/ML KwikPen, Inject 0-15 Units into the skin 3 (three) times daily. Sliding scale, Disp: 15 mL, Rfl: 0   Insulin Pen Needle 32G X 4 MM MISC, Use with insulin TID Dx E11.65, Disp: 300 each, Rfl: 3   metoprolol tartrate (LOPRESSOR) 25 MG tablet, Take 25 mg by mouth 2 (two) times daily., Disp: , Rfl:    nortriptyline (PAMELOR) 25 MG capsule, Take 25 mg by mouth 2 (two) times daily., Disp: , Rfl:    Oxycodone HCl 10 MG TABS, Take 10 mg by mouth every 6 (six) hours., Disp: , Rfl:    polyethylene glycol (MIRALAX / GLYCOLAX) 17 g packet, Take 17 g by mouth daily., Disp: 14 each, Rfl: 0   rOPINIRole (REQUIP) 0.5 MG tablet, Take 0.5 mg by mouth at bedtime., Disp: , Rfl:    senna-docusate (SENOKOT-S) 8.6-50  MG tablet, Take 1 tablet by mouth 2 (two) times daily., Disp: 60 tablet, Rfl: 0   zinc gluconate 50 MG tablet, Take 50 mg by mouth daily., Disp: , Rfl:   Observations/Objective: Patient is well-developed, well-nourished in no acute distress.  Resting comfortably  at home.  Head is normocephalic, atraumatic.  No labored breathing.  Speech is clear and coherent with logical content.  Patient is alert and oriented at baseline.  Pain with wiggling right tear Gum and tooth pain on right side  Assessment and Plan:  Sally Porter in today with chief complaint of Otalgia   1. Right ear pain Force fluids Motrin or tylenol OTC for pain  Meds ordered this encounter  Medications   amoxicillin-clavulanate (AUGMENTIN) 875-125 MG tablet    Sig: Take 1 tablet by mouth 2 (two)  times daily.    Dispense:  20 tablet    Refill:  0    Order Specific Question:   Supervising Provider    Answer:   Caryl Pina A [[id number]]   fluticasone (FLONASE) 50 MCG/ACT nasal spray    Sig: Place 2 sprays into both nostrils daily.    Dispense:  16 g    Refill:  6    Order Specific Question:   Supervising Provider    Answer:   Caryl Pina A [id number]      Follow Up Instructions: I discussed the assessment and treatment plan with the patient. The patient was provided an opportunity to ask questions and all were answered. The patient agreed with the plan and demonstrated an understanding of the instructions.  A copy of instructions were sent to the patient via MyChart.  The patient was advised to call back or seek an in-person evaluation if the symptoms worsen or if the condition fails to improve as anticipated.  Time:  I spent 6 minutes with the patient via telehealth technology discussing the above problems/concerns.    [name], FNP

## 2022-05-03 NOTE — Patient Instructions (Signed)
Erma Klawitter, thank you for joining Chevis Pretty, FNP for today's virtual visit.  While this provider is not your primary care provider (PCP), if your PCP is located in our provider database this encounter information will be shared with them immediately following your visit.   [facility] account gives you access to today's visit and all your visits, tests, and labs performed at [facility] " click here if you don't have a [facility] account or go to [email or web address]  Consent: (Patient) Sally Porter provided verbal consent for this virtual visit at the beginning of the encounter.  Current Medications:  Current Outpatient Medications:    amoxicillin-clavulanate (AUGMENTIN) 875-125 MG tablet, Take 1 tablet by mouth 2 (two) times daily., Disp: 20 tablet, Rfl: 0   fluticasone (FLONASE) 50 MCG/ACT nasal spray, Place 2 sprays into both nostrils daily., Disp: 16 g, Rfl: 6   albuterol (VENTOLIN HFA) 108 (90 Base) MCG/ACT inhaler, Inhale 2 puffs into the lungs every 6 (six) hours as needed for wheezing or shortness of breath., Disp: 8 g, Rfl: 5   ALPRAZolam (XANAX) 0.5 MG tablet, Take 0.5 mg by mouth 2 (two) times daily., Disp: , Rfl:    atorvastatin (LIPITOR) 20 MG tablet, TAKE 1 TABLET BY MOUTH DAILY AT 6PM, Disp: 30 tablet, Rfl: 0   calcitRIOL (ROCALTROL) 0.5 MCG capsule, TAKE ONE CAPSULE BY MOUTH DAILY, Disp: 30 capsule, Rfl: 0   calcium acetate (PHOSLO) 667 MG capsule, TAKE TWO CAPSULES BY MOUTH THREE TIMES DAILY WITH meals, Disp: 180 capsule, Rfl: 0   Continuous Blood Gluc Sensor (DEXCOM G7 SENSOR) MISC, Apply to arm every 10 days to test blood sugar continuously. DX: E11.65, Disp: 3 each, Rfl: 11   ferrous sulfate 325 (65 FE) MG tablet, Take 325 mg by mouth daily with breakfast., Disp: , Rfl:    hydrALAZINE (APRESOLINE) 50 MG tablet, TAKE 1 TABLET BY MOUTH EVERY 8 HOURS, Disp: 90 tablet, Rfl: 0   insulin glargine (LANTUS SOLOSTAR) 100  UNIT/ML Solostar Pen, INJECT 35 UNITS INTO THE SKIN AT BEDTIME, Disp: 30 mL, Rfl: 0   insulin lispro (HUMALOG KWIKPEN) 100 UNIT/ML KwikPen, Inject 0-15 Units into the skin 3 (three) times daily. Sliding scale, Disp: 15 mL, Rfl: 0   Insulin Pen Needle 32G X 4 MM MISC, Use with insulin TID Dx E11.65, Disp: 300 each, Rfl: 3   metoprolol tartrate (LOPRESSOR) 25 MG tablet, Take 25 mg by mouth 2 (two) times daily., Disp: , Rfl:    nortriptyline (PAMELOR) 25 MG capsule, Take 25 mg by mouth 2 (two) times daily., Disp: , Rfl:    Oxycodone HCl 10 MG TABS, Take 10 mg by mouth every 6 (six) hours., Disp: , Rfl:    polyethylene glycol (MIRALAX / GLYCOLAX) 17 g packet, Take 17 g by mouth daily., Disp: 14 each, Rfl: 0   rOPINIRole (REQUIP) 0.5 MG tablet, Take 0.5 mg by mouth at bedtime., Disp: , Rfl:    senna-docusate (SENOKOT-S) 8.6-50 MG tablet, Take 1 tablet by mouth 2 (two) times daily., Disp: 60 tablet, Rfl: 0   zinc gluconate 50 MG tablet, Take 50 mg by mouth daily., Disp: , Rfl:    Medications ordered in this encounter:  Meds ordered this encounter  Medications   amoxicillin-clavulanate (AUGMENTIN) 875-125 MG tablet    Sig: Take 1 tablet by mouth 2 (two) times daily.    Dispense:  20 tablet    Refill:  0    Order Specific Question:   Supervising  Provider    Answer:   Caryl Pina A [[id number]]   fluticasone (FLONASE) 50 MCG/ACT nasal spray    Sig: Place 2 sprays into both nostrils daily.    Dispense:  16 g    Refill:  6    Order Specific Question:   Supervising Provider    Answer:   Caryl Pina A [id number]     *If you need refills on other medications prior to your next appointment, please contact your pharmacy*  Follow-Up: Call back or seek an in-person evaluation if the symptoms worsen or if the condition fails to improve as anticipated.  [place]  Other Instructions Force fluids Motrin or tylenol OTC for pain   If you have been instructed to  have an in-person evaluation today at a local Urgent Care facility, please use the link below. It will take you to a list of all of our available [facility], including address, phone number and hours of operation. Please do not delay care.  [facility]  If you or a family member do not have a primary care provider, use the link below to schedule a visit and establish care. When you choose a [facility] primary care physician or advanced practice provider, you gain a long-term partner in health. Find a Primary Care Provider  Learn more about [facility] in-office and virtual care options: [facility]

## 2022-05-05 ENCOUNTER — Telehealth: Payer: Self-pay | Admitting: Nurse Practitioner

## 2022-05-05 DIAGNOSIS — Z992 Dependence on renal dialysis: Secondary | ICD-10-CM | POA: Diagnosis not present

## 2022-05-05 DIAGNOSIS — N186 End stage renal disease: Secondary | ICD-10-CM | POA: Diagnosis not present

## 2022-05-05 DIAGNOSIS — N2581 Secondary hyperparathyroidism of renal origin: Secondary | ICD-10-CM | POA: Diagnosis not present

## 2022-05-05 NOTE — Telephone Encounter (Signed)
Is she still having ear pain? Probably should do video visit to discuss

## 2022-05-05 NOTE — Telephone Encounter (Signed)
Patient called to let MMM know that she has tried taking the Amoxicillin prescribed to her for ear infection but says everytime she does she gets sick and has hot flashes. Wants to know if something else can be sent in for her?

## 2022-05-05 NOTE — Telephone Encounter (Signed)
Patient calling back to check on the status of getting another medication called in.

## 2022-05-05 NOTE — Telephone Encounter (Signed)
Appointment scheduled tomorrow, 05/06/22, at 11:30 with Chevis Pretty for video visit.

## 2022-05-06 ENCOUNTER — Telehealth (INDEPENDENT_AMBULATORY_CARE_PROVIDER_SITE_OTHER): Payer: 59 | Admitting: Nurse Practitioner

## 2022-05-06 ENCOUNTER — Encounter: Payer: Self-pay | Admitting: Nurse Practitioner

## 2022-05-06 DIAGNOSIS — H9202 Otalgia, left ear: Secondary | ICD-10-CM | POA: Diagnosis not present

## 2022-05-06 MED ORDER — DOXYCYCLINE HYCLATE 100 MG PO TABS: 100.0000 mg | ORAL_TABLET | Freq: Two times a day (BID) | ORAL | 0 refills | Status: DC

## 2022-05-06 NOTE — Patient Instructions (Signed)
  Earache, Adult An earache, or ear pain, can be caused by many things, including: An infection. Ear wax buildup. Ear pressure. Something in the ear that should not be there (foreign body). A sore throat. Tooth problems. Jaw problems. Treatment of the earache will depend on the cause. If the cause is not clear or cannot be known, you may need to watch your symptoms until your earache goes away or until a cause is found. Follow these instructions at home: Medicines Take or apply over-the-counter and prescription medicines only as told by your health care provider. If you were prescribed antibiotics, use them as told by your health care provider. Do not stop using the antibiotic even if you start to feel better. Do not put anything in your ear other than medicine that is prescribed by your health care provider. Managing pain     If directed, apply heat to the affected area as often as told by your health care provider. Use the heat source that your health care provider recommends, such as a moist heat pack or a heating pad. Place a towel between your skin and the heat source. Leave the heat on for 20-30 minutes. If your skin turns bright red, remove the heat right away to prevent burns. The risk of burns is higher if you cannot feel pain, heat, or cold. If directed, put ice on the affected area. To do this: Put ice in a plastic bag. Place a towel between your skin and the bag. Leave the ice on for 20 minutes, 2-3 times a day. If your skin turns bright red, remove the ice right away to prevent skin damage. The risk of skin damage is higher if you cannot feel pain, heat, or cold.  General instructions Pay attention to any changes in your symptoms. Try resting in an upright position instead of lying down. This may help to reduce pressure in your ear and relieve pain. Chew gum if it helps to relieve your ear pain. Treat any allergies as told by your health care provider. Drink enough  fluid to keep your urine pale yellow. It is up to you to get the results of any tests that were done. Ask your health care provider, or the department that is doing the tests, when your results will be ready. Contact a health care provider if: Your pain does not improve within 2 days. Your earache gets worse. You have new symptoms. You have a fever. Get help right away if: You have a severe headache. You have a stiff neck. You have trouble swallowing. You have redness or swelling behind your ear. You have fluid or blood coming from your ear. You have hearing loss. You feel dizzy. This information is not intended to replace advice given to you by your health care provider. Make sure you discuss any questions you have with your health care provider. Document Revised: 06/29/2021 Document Reviewed: 06/29/2021 Elsevier Patient Education  2023 Elsevier Inc.  

## 2022-05-06 NOTE — Progress Notes (Signed)
Virtual Visit Consent   Sally Porter, you are scheduled for a virtual visit with [name], [place], a [facility] provider, today.     Just as with appointments in the office, your consent must be obtained to participate.  Your consent will be active for this visit and any virtual visit you may have with one of our providers in the next 365 days.     If you have a MyChart account, a copy of this consent can be sent to you electronically.  All virtual visits are billed to your insurance company just like a traditional visit in the office.    As this is a virtual visit, video technology does not allow for your provider to perform a traditional examination.  This may limit your provider's ability to fully assess your condition.  If your provider identifies any concerns that need to be evaluated in person or the need to arrange testing (such as labs, EKG, etc.), we will make arrangements to do so.     Although advances in technology are sophisticated, we cannot ensure that it will always work on either your end or our end.  If the connection with a video visit is poor, the visit may have to be switched to a telephone visit.  With either a video or telephone visit, we are not always able to ensure that we have a secure connection.     I need to obtain your verbal consent now.   Are you willing to proceed with your visit today? YES   Akina Sinko has provided verbal consent on 05/06/2022 for a virtual visit (video or telephone).   [name], FNP   Date: 05/06/2022 11:35 AM   Virtual Visit via Video Note   I, [name], connected with Nicoletta Kooy ([id number], 1981-02-08) on 05/06/22 at 11:30 AM EST by a video-enabled telemedicine application and verified that I am speaking with the correct person using two identifiers.  Location: Patient: Virtual Visit Location Patient: Home Provider: Virtual Visit Location Provider: Mobile   I discussed the limitations  of evaluation and management by telemedicine and the availability of in person appointments. The patient expressed understanding and agreed to proceed.    History of Present Illness: Sally Porter is a 42 y.o. who identifies as a female who was assigned female at birth, and is being seen today for otalgia.  HPI: Patient did video visit on 04/04/22 with ear pain. She was started on amoxicillin. The amoxicillin caused nausea, vomiting and sweats. She only took it for 2 days. Ear still hurting. Rates pain 8/10. No drainage. No fever.    Review of Systems  Constitutional:  Negative for chills and fever.  HENT:  Positive for ear pain. Negative for ear discharge.   Cardiovascular: Negative.     Problems:  Patient Active Problem List   Diagnosis Date Noted   Acute renal failure superimposed on stage 4 chronic kidney disease ([place]) 08/14/2021   Intractable nausea and vomiting 08/14/2021   Obesity, Class III, BMI 40-49.9 (morbid obesity) ([place]) 08/14/2021   Opioid dependence ([place]) 08/14/2021   Uncontrolled type 2 diabetes mellitus with hyperglycemia, with long-term current use of insulin ([place]) 08/14/2021   Type 1 diabetes mellitus with stage 5 chronic kidney disease not on chronic dialysis ([place]) 05/01/2019   Mixed hyperlipidemia 05/01/2019   Essential hypertension, benign 05/01/2019   Vitamin D deficiency 05/01/2019   Personal history of noncompliance with medical treatment, presenting hazards to health 05/01/2019   Hyperkalemia 05/01/2019  Allergies:  Allergies  Allergen Reactions   Tylenol [Acetaminophen] Other (See Comments)    Patient stated she does not take due to her kidney function.   Dilaudid [Hydromorphone] Itching   Lyrica [Pregabalin] Itching   Morphine And Related Itching   Medications:  Current Outpatient Medications:    albuterol (VENTOLIN HFA) 108 (90 Base) MCG/ACT inhaler, Inhale 2 puffs into the lungs every 6 (six) hours as needed for wheezing or shortness of  breath., Disp: 8 g, Rfl: 5   ALPRAZolam (XANAX) 0.5 MG tablet, Take 0.5 mg by mouth 2 (two) times daily., Disp: , Rfl:    amoxicillin-clavulanate (AUGMENTIN) 875-125 MG tablet, Take 1 tablet by mouth 2 (two) times daily., Disp: 20 tablet, Rfl: 0   atorvastatin (LIPITOR) 20 MG tablet, TAKE 1 TABLET BY MOUTH DAILY AT 6PM, Disp: 30 tablet, Rfl: 0   calcitRIOL (ROCALTROL) 0.5 MCG capsule, TAKE ONE CAPSULE BY MOUTH DAILY, Disp: 30 capsule, Rfl: 0   calcium acetate (PHOSLO) 667 MG capsule, TAKE TWO CAPSULES BY MOUTH THREE TIMES DAILY WITH meals, Disp: 180 capsule, Rfl: 0   Continuous Blood Gluc Sensor (DEXCOM G7 SENSOR) MISC, Apply to arm every 10 days to test blood sugar continuously. DX: E11.65, Disp: 3 each, Rfl: 11   ferrous sulfate 325 (65 FE) MG tablet, Take 325 mg by mouth daily with breakfast., Disp: , Rfl:    fluticasone (FLONASE) 50 MCG/ACT nasal spray, Place 2 sprays into both nostrils daily., Disp: 16 g, Rfl: 6   hydrALAZINE (APRESOLINE) 50 MG tablet, TAKE 1 TABLET BY MOUTH EVERY 8 HOURS, Disp: 90 tablet, Rfl: 0   insulin glargine (LANTUS SOLOSTAR) 100 UNIT/ML Solostar Pen, INJECT 35 UNITS INTO THE SKIN AT BEDTIME, Disp: 30 mL, Rfl: 0   insulin lispro (HUMALOG KWIKPEN) 100 UNIT/ML KwikPen, Inject 0-15 Units into the skin 3 (three) times daily. Sliding scale, Disp: 15 mL, Rfl: 0   Insulin Pen Needle 32G X 4 MM MISC, Use with insulin TID Dx E11.65, Disp: 300 each, Rfl: 3   metoprolol tartrate (LOPRESSOR) 25 MG tablet, Take 25 mg by mouth 2 (two) times daily., Disp: , Rfl:    nortriptyline (PAMELOR) 25 MG capsule, Take 25 mg by mouth 2 (two) times daily., Disp: , Rfl:    Oxycodone HCl 10 MG TABS, Take 10 mg by mouth every 6 (six) hours., Disp: , Rfl:    polyethylene glycol (MIRALAX / GLYCOLAX) 17 g packet, Take 17 g by mouth daily., Disp: 14 each, Rfl: 0   rOPINIRole (REQUIP) 0.5 MG tablet, Take 0.5 mg by mouth at bedtime., Disp: , Rfl:    senna-docusate (SENOKOT-S) 8.6-50 MG tablet, Take 1  tablet by mouth 2 (two) times daily., Disp: 60 tablet, Rfl: 0   zinc gluconate 50 MG tablet, Take 50 mg by mouth daily., Disp: , Rfl:   Observations/Objective: Patient is well-developed, well-nourished in no acute distress.  Resting comfortably  at home.  Head is normocephalic, atraumatic.  No labored breathing.  Speech is clear and coherent with logical content.  Patient is alert and oriented at baseline.  Left ear pain- no drainage - Assessment and Plan:  Ayah Kreps in today with chief complaint of Otalgia   1. Left ear pain Motrin or tylenol OTC Change antibiotic  to doxycycline  Meds ordered this encounter  Medications   doxycycline (VIBRA-TABS) 100 MG tablet    Sig: Take 1 tablet (100 mg total) by mouth 2 (two) times daily. 1 po bid    Dispense:  20 tablet    Refill:  0    Order Specific Question:   Supervising Provider    Answer:   Caryl Pina A [id number]     Follow Up Instructions: I discussed the assessment and treatment plan with the patient. The patient was provided an opportunity to ask questions and all were answered. The patient agreed with the plan and demonstrated an understanding of the instructions.  A copy of instructions were sent to the patient via MyChart.  The patient was advised to call back or seek an in-person evaluation if the symptoms worsen or if the condition fails to improve as anticipated.  Time:  I spent 10 minutes with the patient via telehealth technology discussing the above problems/concerns.    [name], FNP

## 2022-05-07 DIAGNOSIS — N186 End stage renal disease: Secondary | ICD-10-CM | POA: Diagnosis not present

## 2022-05-07 DIAGNOSIS — Z992 Dependence on renal dialysis: Secondary | ICD-10-CM | POA: Diagnosis not present

## 2022-05-07 DIAGNOSIS — N2581 Secondary hyperparathyroidism of renal origin: Secondary | ICD-10-CM | POA: Diagnosis not present

## 2022-05-10 DIAGNOSIS — N186 End stage renal disease: Secondary | ICD-10-CM | POA: Diagnosis not present

## 2022-05-10 DIAGNOSIS — N2581 Secondary hyperparathyroidism of renal origin: Secondary | ICD-10-CM | POA: Diagnosis not present

## 2022-05-10 DIAGNOSIS — Z992 Dependence on renal dialysis: Secondary | ICD-10-CM | POA: Diagnosis not present

## 2022-05-11 ENCOUNTER — Ambulatory Visit: Payer: 59 | Admitting: Family Medicine

## 2022-05-11 ENCOUNTER — Ambulatory Visit: Payer: Medicare Other | Admitting: Nurse Practitioner

## 2022-05-12 DIAGNOSIS — Z992 Dependence on renal dialysis: Secondary | ICD-10-CM | POA: Diagnosis not present

## 2022-05-12 DIAGNOSIS — N2581 Secondary hyperparathyroidism of renal origin: Secondary | ICD-10-CM | POA: Diagnosis not present

## 2022-05-12 DIAGNOSIS — N186 End stage renal disease: Secondary | ICD-10-CM | POA: Diagnosis not present

## 2022-05-14 DIAGNOSIS — N186 End stage renal disease: Secondary | ICD-10-CM | POA: Diagnosis not present

## 2022-05-14 DIAGNOSIS — Z992 Dependence on renal dialysis: Secondary | ICD-10-CM | POA: Diagnosis not present

## 2022-05-14 DIAGNOSIS — N2581 Secondary hyperparathyroidism of renal origin: Secondary | ICD-10-CM | POA: Diagnosis not present

## 2022-05-17 DIAGNOSIS — N186 End stage renal disease: Secondary | ICD-10-CM | POA: Diagnosis not present

## 2022-05-17 DIAGNOSIS — Z992 Dependence on renal dialysis: Secondary | ICD-10-CM | POA: Diagnosis not present

## 2022-05-17 DIAGNOSIS — N2581 Secondary hyperparathyroidism of renal origin: Secondary | ICD-10-CM | POA: Diagnosis not present

## 2022-05-19 DIAGNOSIS — Z992 Dependence on renal dialysis: Secondary | ICD-10-CM | POA: Diagnosis not present

## 2022-05-19 DIAGNOSIS — N2581 Secondary hyperparathyroidism of renal origin: Secondary | ICD-10-CM | POA: Diagnosis not present

## 2022-05-19 DIAGNOSIS — N186 End stage renal disease: Secondary | ICD-10-CM | POA: Diagnosis not present

## 2022-05-21 DIAGNOSIS — N186 End stage renal disease: Secondary | ICD-10-CM | POA: Diagnosis not present

## 2022-05-21 DIAGNOSIS — N2581 Secondary hyperparathyroidism of renal origin: Secondary | ICD-10-CM | POA: Diagnosis not present

## 2022-05-21 DIAGNOSIS — Z992 Dependence on renal dialysis: Secondary | ICD-10-CM | POA: Diagnosis not present

## 2022-05-24 DIAGNOSIS — Z992 Dependence on renal dialysis: Secondary | ICD-10-CM | POA: Diagnosis not present

## 2022-05-24 DIAGNOSIS — N186 End stage renal disease: Secondary | ICD-10-CM | POA: Diagnosis not present

## 2022-05-24 DIAGNOSIS — N2581 Secondary hyperparathyroidism of renal origin: Secondary | ICD-10-CM | POA: Diagnosis not present

## 2022-05-26 ENCOUNTER — Other Ambulatory Visit: Payer: Self-pay | Admitting: Family Medicine

## 2022-05-26 DIAGNOSIS — N2581 Secondary hyperparathyroidism of renal origin: Secondary | ICD-10-CM | POA: Diagnosis not present

## 2022-05-26 DIAGNOSIS — Z992 Dependence on renal dialysis: Secondary | ICD-10-CM | POA: Diagnosis not present

## 2022-05-26 DIAGNOSIS — E782 Mixed hyperlipidemia: Secondary | ICD-10-CM

## 2022-05-26 DIAGNOSIS — N186 End stage renal disease: Secondary | ICD-10-CM | POA: Diagnosis not present

## 2022-05-27 DIAGNOSIS — M25512 Pain in left shoulder: Secondary | ICD-10-CM | POA: Diagnosis not present

## 2022-05-27 DIAGNOSIS — M25571 Pain in right ankle and joints of right foot: Secondary | ICD-10-CM | POA: Diagnosis not present

## 2022-05-27 DIAGNOSIS — M542 Cervicalgia: Secondary | ICD-10-CM | POA: Diagnosis not present

## 2022-05-27 DIAGNOSIS — G894 Chronic pain syndrome: Secondary | ICD-10-CM | POA: Diagnosis not present

## 2022-05-27 DIAGNOSIS — M25511 Pain in right shoulder: Secondary | ICD-10-CM | POA: Diagnosis not present

## 2022-05-28 DIAGNOSIS — N2581 Secondary hyperparathyroidism of renal origin: Secondary | ICD-10-CM | POA: Diagnosis not present

## 2022-05-28 DIAGNOSIS — Z992 Dependence on renal dialysis: Secondary | ICD-10-CM | POA: Diagnosis not present

## 2022-05-28 DIAGNOSIS — N186 End stage renal disease: Secondary | ICD-10-CM | POA: Diagnosis not present

## 2022-05-30 DIAGNOSIS — N186 End stage renal disease: Secondary | ICD-10-CM | POA: Diagnosis not present

## 2022-05-30 DIAGNOSIS — Z992 Dependence on renal dialysis: Secondary | ICD-10-CM | POA: Diagnosis not present

## 2022-05-31 DIAGNOSIS — N2581 Secondary hyperparathyroidism of renal origin: Secondary | ICD-10-CM | POA: Diagnosis not present

## 2022-05-31 DIAGNOSIS — Z992 Dependence on renal dialysis: Secondary | ICD-10-CM | POA: Diagnosis not present

## 2022-05-31 DIAGNOSIS — N186 End stage renal disease: Secondary | ICD-10-CM | POA: Diagnosis not present

## 2022-06-02 DIAGNOSIS — Z992 Dependence on renal dialysis: Secondary | ICD-10-CM | POA: Diagnosis not present

## 2022-06-02 DIAGNOSIS — N2581 Secondary hyperparathyroidism of renal origin: Secondary | ICD-10-CM | POA: Diagnosis not present

## 2022-06-02 DIAGNOSIS — N186 End stage renal disease: Secondary | ICD-10-CM | POA: Diagnosis not present

## 2022-06-04 DIAGNOSIS — Z992 Dependence on renal dialysis: Secondary | ICD-10-CM | POA: Diagnosis not present

## 2022-06-04 DIAGNOSIS — N2581 Secondary hyperparathyroidism of renal origin: Secondary | ICD-10-CM | POA: Diagnosis not present

## 2022-06-04 DIAGNOSIS — N186 End stage renal disease: Secondary | ICD-10-CM | POA: Diagnosis not present

## 2022-06-07 DIAGNOSIS — Z992 Dependence on renal dialysis: Secondary | ICD-10-CM | POA: Diagnosis not present

## 2022-06-07 DIAGNOSIS — N186 End stage renal disease: Secondary | ICD-10-CM | POA: Diagnosis not present

## 2022-06-07 DIAGNOSIS — N2581 Secondary hyperparathyroidism of renal origin: Secondary | ICD-10-CM | POA: Diagnosis not present

## 2022-06-08 ENCOUNTER — Ambulatory Visit: Payer: 59 | Admitting: Family Medicine

## 2022-06-09 DIAGNOSIS — N186 End stage renal disease: Secondary | ICD-10-CM | POA: Diagnosis not present

## 2022-06-09 DIAGNOSIS — Z992 Dependence on renal dialysis: Secondary | ICD-10-CM | POA: Diagnosis not present

## 2022-06-09 DIAGNOSIS — N2581 Secondary hyperparathyroidism of renal origin: Secondary | ICD-10-CM | POA: Diagnosis not present

## 2022-06-10 ENCOUNTER — Ambulatory Visit: Payer: 59 | Admitting: Family Medicine

## 2022-06-11 ENCOUNTER — Encounter: Payer: Self-pay | Admitting: Nurse Practitioner

## 2022-06-12 ENCOUNTER — Emergency Department (HOSPITAL_COMMUNITY)
Admission: EM | Admit: 2022-06-12 | Discharge: 2022-06-13 | Disposition: A | Discharge status: Home / Self Care | Payer: 59 | Attending: Emergency Medicine | Admitting: Emergency Medicine

## 2022-06-12 ENCOUNTER — Encounter (HOSPITAL_COMMUNITY): Payer: Self-pay

## 2022-06-12 ENCOUNTER — Other Ambulatory Visit: Payer: Self-pay

## 2022-06-12 DIAGNOSIS — I120 Hypertensive chronic kidney disease with stage 5 chronic kidney disease or end stage renal disease: Secondary | ICD-10-CM | POA: Diagnosis not present

## 2022-06-12 DIAGNOSIS — O034 Incomplete spontaneous abortion without complication: Secondary | ICD-10-CM | POA: Diagnosis not present

## 2022-06-12 DIAGNOSIS — O039 Complete or unspecified spontaneous abortion without complication: Secondary | ICD-10-CM | POA: Diagnosis not present

## 2022-06-12 DIAGNOSIS — N186 End stage renal disease: Secondary | ICD-10-CM | POA: Diagnosis not present

## 2022-06-12 DIAGNOSIS — Z3A00 Weeks of gestation of pregnancy not specified: Secondary | ICD-10-CM

## 2022-06-12 DIAGNOSIS — R6889 Other general symptoms and signs: Secondary | ICD-10-CM | POA: Diagnosis not present

## 2022-06-12 DIAGNOSIS — N939 Abnormal uterine and vaginal bleeding, unspecified: Secondary | ICD-10-CM

## 2022-06-12 DIAGNOSIS — R739 Hyperglycemia, unspecified: Secondary | ICD-10-CM

## 2022-06-12 DIAGNOSIS — Z743 Need for continuous supervision: Secondary | ICD-10-CM | POA: Diagnosis not present

## 2022-06-12 DIAGNOSIS — N2581 Secondary hyperparathyroidism of renal origin: Secondary | ICD-10-CM | POA: Diagnosis not present

## 2022-06-12 DIAGNOSIS — Z794 Long term (current) use of insulin: Secondary | ICD-10-CM | POA: Insufficient documentation

## 2022-06-12 DIAGNOSIS — E1165 Type 2 diabetes mellitus with hyperglycemia: Secondary | ICD-10-CM | POA: Insufficient documentation

## 2022-06-12 DIAGNOSIS — Z79899 Other long term (current) drug therapy: Secondary | ICD-10-CM | POA: Diagnosis not present

## 2022-06-12 DIAGNOSIS — E1122 Type 2 diabetes mellitus with diabetic chronic kidney disease: Secondary | ICD-10-CM | POA: Diagnosis not present

## 2022-06-12 DIAGNOSIS — Z992 Dependence on renal dialysis: Secondary | ICD-10-CM | POA: Insufficient documentation

## 2022-06-12 DIAGNOSIS — R109 Unspecified abdominal pain: Secondary | ICD-10-CM | POA: Diagnosis not present

## 2022-06-12 DIAGNOSIS — R58 Hemorrhage, not elsewhere classified: Secondary | ICD-10-CM | POA: Diagnosis not present

## 2022-06-12 LAB — BASIC METABOLIC PANEL
Anion gap: 18 — ABNORMAL HIGH (ref 5–15)
BUN: 20 mg/dL (ref 6–20)
CO2: 21 mmol/L — ABNORMAL LOW (ref 22–32)
Calcium: 7.7 mg/dL — ABNORMAL LOW (ref 8.9–10.3)
Chloride: 89 mmol/L — ABNORMAL LOW (ref 98–111)
Creatinine, Ser: 5.91 mg/dL — ABNORMAL HIGH (ref 0.44–1.00)
GFR, Estimated: 9 mL/min — ABNORMAL LOW (ref >60)
Glucose, Bld: 455 mg/dL — ABNORMAL HIGH (ref 70–99)
Potassium: 3.7 mmol/L (ref 3.5–5.1)
Sodium: 128 mmol/L — ABNORMAL LOW (ref 135–145)

## 2022-06-12 LAB — I-STAT BETA HCG BLOOD, ED (MC, WL, AP ONLY): I-stat hCG, quantitative: 1405.5 m[IU]/mL — ABNORMAL HIGH (ref <5)

## 2022-06-12 LAB — BPAM RBC
ISSUE DATE / TIME: 202404132035
Unit Type and Rh: 6200

## 2022-06-12 LAB — CBC
HCT: 26.1 % — ABNORMAL LOW (ref 36.0–46.0)
Hemoglobin: 7.8 g/dL — ABNORMAL LOW (ref 12.0–15.0)
MCH: 23.5 pg — ABNORMAL LOW (ref 26.0–34.0)
MCHC: 29.9 g/dL — ABNORMAL LOW (ref 30.0–36.0)
MCV: 78.6 fL — ABNORMAL LOW (ref 80.0–100.0)
Platelets: 326 10*3/uL (ref 150–400)
RBC: 3.32 MIL/uL — ABNORMAL LOW (ref 3.87–5.11)
RDW: 17.9 % — ABNORMAL HIGH (ref 11.5–15.5)
WBC: 10.1 10*3/uL (ref 4.0–10.5)
nRBC: 0 % (ref 0.0–0.2)

## 2022-06-12 LAB — PREPARE RBC (CROSSMATCH)

## 2022-06-12 LAB — CBG MONITORING, ED: Glucose-Capillary: 481 mg/dL — ABNORMAL HIGH (ref 70–99)

## 2022-06-12 MED ORDER — OXYCODONE HCL 5 MG PO TABS
10.0000 mg | ORAL_TABLET | Freq: Once | ORAL | Status: AC
  Administered 2022-06-12: 10 mg via ORAL
  Filled 2022-06-12: qty 2

## 2022-06-12 MED ORDER — SODIUM CHLORIDE 0.9% IV SOLUTION: Freq: Once | INTRAVENOUS | Status: DC

## 2022-06-12 MED ORDER — SODIUM CHLORIDE 0.9 % IV BOLUS: 500.0000 mL | Freq: Once | INTRAVENOUS | Status: DC

## 2022-06-12 MED ORDER — INSULIN ASPART 100 UNIT/ML IJ SOLN
10.0000 [IU] | Freq: Once | INTRAMUSCULAR | Status: AC
  Administered 2022-06-12: 10 [IU] via SUBCUTANEOUS
  Filled 2022-06-12: qty 1

## 2022-06-12 NOTE — ED Triage Notes (Signed)
Pt presents with c/o vaginal bleeding x one week, as if she were having a menstrual cycle, but today after urinating pt passed large clot and has been bleeding heavily since then. Pt goes to dialysis Mon, [place], Sat and did go today. Pt reports that she hasn't had a cycle in 4 months up until this one, and is sexually active and not birth control. Vitals are stable, hx of HTN, has not took meds today, BP with EMS was 175/71.

## 2022-06-12 NOTE — ED Notes (Signed)
Pt also states she typically does not get a transfusion until her hgb is less than 7.5 esp since she is dialysis.

## 2022-06-12 NOTE — Consult Note (Signed)
   OB/GYN Telephone Consult  06/12/2022   Sally Porter is a 42 y.o. with LMP 01/2022 presenting to [facility]  for heavy vaginal bleeding.   I was called for a consult regarding the care of this patient by the Physician (MD/DO) caring for the patient.   The provider had the following clinical question: plan of care after suspected complete spontaneous abortion.  The provider presented the following relevant clinical information:  Patient is a 42yo with history of T1DM, ESRD on MWF dialysis and irregular periods who presented with a week of progressively heavy bleeding. Reported passing fist sized clots today. In ED, it appeared that she had passed a large amount of clot or pregnancy tissue. BhCG 1405.5, ultrasound not available. Hgb 7.8 (previously 11.2 10/2021). On speculum exam, cervix was open with possible POCs. No active bleeding. She is hemodynamically stable and her bleeding is has slowed down significantly.  I performed a chart review on the patient and reviewed available documentation.  BP (!) 175/76   Pulse 90   Temp 98.4 F (36.9 C) (Oral)   Resp 18   Ht 5\' 8"  (1.727 m)   Wt 114.3 kg   LMP 06/05/2022 (Exact Date)   SpO2 98%   BMI 38.31 kg/m   Exam- performed by consulting provider and described above  Recommendations:  -Transfuse 1u pRBCs -If patient's bleeding remains minimal (menstrual period or less) & does not have persistent symptoms of anemia, this is likely consistent with a complete SAB and patient can be discharged with close interval follow up with [facility] -If patient's bleeding returns/worsens, please call [phone] for the OB/GYN attending on service at [facility] and we can arrange transfer to [facility]  Thank you for this consult and if additional recommendations are needed please call the number above.   I spent approximately 5 minutes directly consulting with the provider and verbally discussing this case. Additionally 10  minutes minutes was spent performing chart review and documentation.    Lennart Pall, MD

## 2022-06-12 NOTE — ED Notes (Signed)
Blood transfusion ordered. Pt is unaware of transfusion. EDP asked to come and talk to patient.

## 2022-06-12 NOTE — ED Triage Notes (Signed)
Pt has history of irregular periods when she is on dialysis. Pt started bleeding 9 days ago and today began passing clots and unable to control bleeding.

## 2022-06-12 NOTE — ED Notes (Signed)
Edp discussed with pt about transfusion. Pt agreed to transfusion.

## 2022-06-12 NOTE — ED Provider Notes (Signed)
This patient is a 42 year old female, unaware that she was pregnant, presents with heavy vaginal bleeding and cramping which started in the last couple of days.  She has passed a significant amount of blood prior to arrival and on arrival passed a very large blood clot.  The patient is a dialysis patient who dialyzed earlier today, she usually does Monday Wednesday Friday.  Her access is in the left upper extremity, good thrill present.  She has no abdominal tenderness, please see physician assistant's notes for vaginal exam.  Hemoglobin is low at 7.8, she has required a unit of blood in the past, she has a quant of 1400 consistent with a likely spontaneous miscarriage.  I discussed the case with Dr. Al Decant at the [facility] at [facility], she suggested the patient get a unit of blood and as long as she does not continue to have heavy bleeding she can be discharged to follow-up in the outpatient setting.  Interestingly the patient also has an abnormal metabolic panel with an elevated glucose and CO2 and a slight anion gap.  She will get some fluids some insulin and a recheck.  The patient appears hemodynamically stable at this time and on my exam  Medical screening examination/treatment/procedure(s) were conducted as a shared visit with non-physician practitioner(s) and myself.  I personally evaluated the patient during the encounter.  Clinical Impression:   Final diagnoses:  None         Eber Hong, MD 06/14/22 2032

## 2022-06-12 NOTE — Discharge Instructions (Addendum)
It was a pleasure taking care of you today. Please follow up with your OBGYN within 2 days to verify that no products of conception are retained. Seek emergency care if experiencing new or worsening symptoms.

## 2022-06-12 NOTE — ED Provider Notes (Cosign Needed Addendum)
[place] [facility]GENC[facility] DEPARTMENT AT [facility] Provider Note   CSN: [id number] Arrival date & time: 06/12/22  1647     History  Chief Complaint  Patient presents with   Vaginal Bleeding    Sally Porter is a 42 y.o. G1P1 female with past medical history of HTN, DM, and ESRD who presents to ED complaining of vaginal bleeding. Patient was having normal menstrual bleeding and cramping this week, but when she urinated this morning, she reported passing a large blood clot and having increased bleeding. She has used 3 pads today. Patient is sexually active, does not use birth control, and has not had a cycle in 4 months until earlier this week. Denies lightheadedness, weakness, chest pain, palpitations, dyspnea, fatigue. Denies blood thinners, family hx of bleeding disorders, past STIs.    Vaginal Bleeding      Home Medications Prior to Admission medications   Medication Sig Start Date End Date Taking? Authorizing Provider  albuterol (VENTOLIN HFA) 108 (90 Base) MCG/ACT inhaler Inhale 2 puffs into the lungs every 6 (six) hours as needed for wheezing or shortness of breath. 12/22/21  Yes Daryll Drown, NP  ALPRAZolam Prudy Feeler) 0.25 MG tablet Take 0.25 mg by mouth 2 (two) times daily. 05/06/22  Yes [provider]  atorvastatin (LIPITOR) 20 MG tablet TAKE 1 TABLET BY MOUTH DAILY AT 6PM 05/26/22  Yes Rakes, Doralee Albino, FNP  calcitRIOL (ROCALTROL) 0.5 MCG capsule TAKE ONE CAPSULE BY MOUTH DAILY Patient taking differently: Take 6 capsules by mouth every Monday, Wednesday, and Friday. 05/26/22  Yes Rakes, Doralee Albino, FNP  calcium acetate (PHOSLO) 667 MG capsule TAKE TWO CAPSULES BY MOUTH THREE TIMES DAILY WITH meals Patient taking differently: Take 1,334 mg by mouth 3 (three) times daily with meals. 2 with breakfast and lunch, then take 3 with dinner 03/26/22  Yes Rakes, Doralee Albino, FNP  ferrous sulfate 325 (65 FE) MG tablet Take 325 mg by mouth daily with breakfast.   Yes [provider]  fluticasone (FLONASE) 50 MCG/ACT nasal spray Place 2 sprays into both nostrils daily. 05/03/22  Yes Martin, Mary-Margaret, FNP  hydrALAZINE (APRESOLINE) 50 MG tablet TAKE 1 TABLET BY MOUTH EVERY 8 HOURS 05/26/22  Yes Rakes, Doralee Albino, FNP  insulin glargine (LANTUS SOLOSTAR) 100 UNIT/ML Solostar Pen INJECT 35 UNITS INTO THE SKIN AT BEDTIME 04/19/22  Yes Rakes, Doralee Albino, FNP  insulin lispro (HUMALOG KWIKPEN) 100 UNIT/ML KwikPen Inject 0-15 Units into the skin 3 (three) times daily. Sliding scale 01/29/22  Yes Daryll Drown, NP  Insulin Pen Needle 32G X 4 MM MISC Use with insulin TID Dx E11.65 12/22/21  Yes Ijaola, Feliberto Harts, NP  LASIX 40 MG tablet Take 40 mg by mouth daily. 05/12/22  Yes [provider]  lidocaine (XYLOCAINE) 5 % ointment Apply 1 Application topically 2 (two) times daily as needed for moderate pain. 04/22/22  Yes [provider]  metoprolol tartrate (LOPRESSOR) 25 MG tablet Take 25 mg by mouth 2 (two) times daily. 01/29/22  Yes [provider]  naloxone (NARCAN) nasal spray 4 mg/0.1 mL Place 0.4 mg into the nose once. 11/13/21  Yes [provider]  nortriptyline (PAMELOR) 25 MG capsule Take 25 mg by mouth 2 (two) times daily. 07/28/18  Yes [provider]  Oxycodone HCl 10 MG TABS Take 10 mg by mouth every 6 (six) hours. 10/03/21  Yes [provider]  polyethylene glycol (MIRALAX / GLYCOLAX) 17 g packet Take 17 g by mouth daily.  Patient taking differently: Take 17 g by mouth daily as needed for moderate constipation or mild constipation. 08/20/21  Yes Burnadette Pop, MD  rOPINIRole (REQUIP) 0.5 MG tablet Take 0.5 mg by mouth at bedtime. 07/28/18  Yes [provider]  senna-docusate (SENOKOT-S) 8.6-50 MG tablet Take 1 tablet by mouth 2 (two) times daily. Patient taking differently: Take 1 tablet by mouth daily as needed for moderate constipation or mild constipation. 08/20/21  Yes Burnadette Pop, MD  zinc gluconate 50 MG  tablet Take 50 mg by mouth daily.   Yes [provider]  Continuous Blood Gluc Sensor (DEXCOM G7 SENSOR) MISC Apply to arm every 10 days to test blood sugar continuously. DX: E11.65 01/05/22   Daryll Drown, NP      Allergies    Amoxicillin, Tylenol [acetaminophen], Dilaudid [hydromorphone], Morphine and related, and Pregabalin    Review of Systems   Review of Systems  Genitourinary:  Positive for vaginal bleeding.    Physical Exam Updated Vital Signs BP 123/65   Pulse 87   Temp 98.5 F (36.9 C) (Axillary)   Resp 20   Ht 5\' 8"  (1.727 m)   Wt 114.3 kg   LMP 06/05/2022 (Exact Date)   SpO2 97%   BMI 38.31 kg/m  Physical Exam Vitals and nursing note reviewed. Exam conducted with a chaperone present.  Constitutional:      General: She is not in acute distress.    Appearance: She is normal weight. She is not ill-appearing or toxic-appearing.  HENT:     Head: Normocephalic and atraumatic.     Mouth/Throat:     Mouth: Mucous membranes are moist.  Eyes:     General: No scleral icterus.       Right eye: No discharge.        Left eye: No discharge.     Conjunctiva/sclera: Conjunctivae normal.  Cardiovascular:     Rate and Rhythm: Normal rate.     Pulses: Normal pulses.     Heart sounds: Normal heart sounds. No murmur heard. Pulmonary:     Effort: Pulmonary effort is normal. No respiratory distress.     Breath sounds: Normal breath sounds. No wheezing, rhonchi or rales.  Abdominal:     General: Abdomen is flat. Bowel sounds are normal.     Palpations: Abdomen is soft.     Comments: Mild suprapubic tenderness to palpation  Genitourinary:    Comments: Pelvic exam with chaperone: cervical os dilated with blood clots/products of conception in vault. No hemorrhagic bleeding.  Musculoskeletal:     Right lower leg: No edema.     Left lower leg: No edema.  Skin:    General: Skin is warm and dry.     Findings: No rash.  Neurological:     General: No focal deficit  present.     Mental Status: She is alert. Mental status is at baseline.  Psychiatric:        Mood and Affect: Mood normal.        Behavior: Behavior normal.     ED Results / Procedures / Treatments   Labs (all labs ordered are listed, but only abnormal results are displayed) Labs Reviewed  CBC - Abnormal; Notable for the following components:      Result Value   RBC 3.32 (*)    Hemoglobin 7.8 (*)    HCT 26.1 (*)    MCV 78.6 (*)    MCH 23.5 (*)    MCHC 29.9 (*)  RDW 17.9 (*)    All other components within normal limits  BASIC METABOLIC PANEL - Abnormal; Notable for the following components:   Sodium 128 (*)    Chloride 89 (*)    CO2 21 (*)    Glucose, Bld 455 (*)    Creatinine, Ser 5.91 (*)    Calcium 7.7 (*)    GFR, Estimated 9 (*)    Anion gap 18 (*)    All other components within normal limits  I-STAT BETA HCG BLOOD, ED (MC, [facility], AP ONLY) - Abnormal; Notable for the following components:   I-stat hCG, quantitative 1,405.5 (*)    All other components within normal limits  CBG MONITORING, ED - Abnormal; Notable for the following components:   Glucose-Capillary 481 (*)    All other components within normal limits  PREGNANCY, URINE  BASIC METABOLIC PANEL  CBC  TYPE AND SCREEN  PREPARE RBC (CROSSMATCH)    EKG None  Radiology No results found.  Procedures Pelvic exam  Date/Time: 06/12/2022 9:57 PM  Performed by: Dorthy Cooler, PA-C Authorized by: Dorthy Cooler, PA-C  Consent: Verbal consent obtained. Written consent not obtained. Risks and benefits: risks, benefits and alternatives were discussed Consent given by: patient Patient understanding: patient states understanding of the procedure being performed Patient consent: the patient's understanding of the procedure matches consent given Procedure consent: procedure consent matches procedure scheduled Patient identity confirmed: verbally with patient Local anesthesia used:  no  Anesthesia: Local anesthesia used: no  Sedation: Patient sedated: no  Comments: Nurse Charlotte Sanes was chaperone.        Medications Ordered in ED Medications  0.9 %  sodium chloride infusion (Manually program via Guardrails IV Fluids) (has no administration in time range)  sodium chloride 0.9 % bolus 500 mL (500 mLs Intravenous Patient Refused/Not Given 06/12/22 2023)  insulin aspart (novoLOG) injection 10 Units (10 Units Subcutaneous Given 06/12/22 1939)  oxyCODONE (Oxy IR/ROXICODONE) immediate release tablet 10 mg (10 mg Oral Given 06/12/22 2019)    ED Course/ Medical Decision Making/ A&P                             Medical Decision Making Amount and/or Complexity of Data Reviewed Labs: ordered.  Risk Prescription drug management.   This patient presents to the ED for concern of vaginal bleeding, this involves an extensive number of treatment options, and is a complaint that carries with it a high risk of complications and morbidity.  The differential diagnosis includes ectopic pregnancy, pregnancy termination, placental abruption, placenta previa, uterine rupture, postpartum hemorrhage, acute heavy menstrual bleeding, ruptured ovarian cyst, foreign body   Co morbidities that complicate the patient evaluation  none   Additional history obtained:  none   Lab Tests:  I Ordered, and personally interpreted labs.  The pertinent results include:  - CBC: HGB 7.8 -beta hCG: 1,405 - Type & Screen: A Pos   Consultations Obtained:  I requested consultation with OBGYN,  and discussed lab and imaging findings as well as pertinent plan - they recommend: providing patient with fluid resuscitation and blood transfusion to replete low HGB, re-evaluating patient, and if stable patient should be okay for discharge and expectant management   Problem List / ED Course / Critical interventions / Medication management  11:28 PM Patient presenting to ED complaining of vaginal  bleeding and passing large blood clots today. Approx 5cm blood clot seen in patient's menstrual pad. History of present  illness concerning for spontaneous abortion.  7:00PM Pelvic exam with chaperone showed dilated cervical os with blood clots/products of conception passing through, concerning for incomplete abortion. Patient without systemic signs of anemia; however, given her HGB 7.8 and OBGYN consult, we are giving her blood transfusions here in ED. Will reassess patient after blood transfusion and obtain CBC/BMP prior to discharge. Patient with hyperglycemia and was treated with aspart in ED. Patient is currently endorsing that vaginal bleeding is improving. If she is still feeling good after blood transfusion and vitals are stable, patient will be stable for discharge.   Social Determinants of Health:  none         Final Clinical Impression(s) / ED Diagnoses Final diagnoses:  Hyperglycemia  Incomplete miscarriage  Vaginal bleeding    Rx / DC Orders ED Discharge Orders     None         Margarita Rana 06/12/22 2330    Dorthy Cooler, [place] 06/12/22 2333    Eber Hong, MD 06/14/22 2032

## 2022-06-12 NOTE — ED Notes (Signed)
Gave pt sandwich  

## 2022-06-13 LAB — TYPE AND SCREEN
ABO/RH(D): A POS
Antibody Screen: NEGATIVE
Unit division: 0

## 2022-06-13 LAB — BPAM RBC: Blood Product Expiration Date: 202405122359

## 2022-06-14 DIAGNOSIS — Z992 Dependence on renal dialysis: Secondary | ICD-10-CM | POA: Diagnosis not present

## 2022-06-14 DIAGNOSIS — N186 End stage renal disease: Secondary | ICD-10-CM | POA: Diagnosis not present

## 2022-06-14 DIAGNOSIS — N2581 Secondary hyperparathyroidism of renal origin: Secondary | ICD-10-CM | POA: Diagnosis not present

## 2022-06-16 DIAGNOSIS — Z992 Dependence on renal dialysis: Secondary | ICD-10-CM | POA: Diagnosis not present

## 2022-06-16 DIAGNOSIS — N2581 Secondary hyperparathyroidism of renal origin: Secondary | ICD-10-CM | POA: Diagnosis not present

## 2022-06-16 DIAGNOSIS — N186 End stage renal disease: Secondary | ICD-10-CM | POA: Diagnosis not present

## 2022-06-17 ENCOUNTER — Telehealth: Payer: Self-pay | Admitting: *Deleted

## 2022-06-17 NOTE — Telephone Encounter (Signed)
        Patient  visited [place] on 06/13/2022  for treatment    Telephone encounter attempt :  1st  A HIPAA compliant voice message was left requesting a return call.  Instructed patient to call back at [phone].  Yehuda Mao Greenauer -[facility], Population Health [phone] [address] [place] , [place] [address] Email : [email or web address]

## 2022-06-18 ENCOUNTER — Telehealth: Payer: Self-pay | Admitting: *Deleted

## 2022-06-18 DIAGNOSIS — N2581 Secondary hyperparathyroidism of renal origin: Secondary | ICD-10-CM | POA: Diagnosis not present

## 2022-06-18 DIAGNOSIS — N186 End stage renal disease: Secondary | ICD-10-CM | POA: Diagnosis not present

## 2022-06-18 DIAGNOSIS — Z992 Dependence on renal dialysis: Secondary | ICD-10-CM | POA: Diagnosis not present

## 2022-06-18 NOTE — Telephone Encounter (Signed)
        Patient  visited [place] on 06/14/2022  for treatment   Telephone encounter attempt :  2nd  A HIPAA compliant voice message was left requesting a return call.  Instructed patient to call back at [phone].  Yehuda Mao Greenauer -[facility] [place], Population Health [phone] [address] [place] , [place] [address] Email : [email or web address]

## 2022-06-21 DIAGNOSIS — Z992 Dependence on renal dialysis: Secondary | ICD-10-CM | POA: Diagnosis not present

## 2022-06-21 DIAGNOSIS — N2581 Secondary hyperparathyroidism of renal origin: Secondary | ICD-10-CM | POA: Diagnosis not present

## 2022-06-21 DIAGNOSIS — N186 End stage renal disease: Secondary | ICD-10-CM | POA: Diagnosis not present

## 2022-06-23 DIAGNOSIS — N2581 Secondary hyperparathyroidism of renal origin: Secondary | ICD-10-CM | POA: Diagnosis not present

## 2022-06-23 DIAGNOSIS — Z992 Dependence on renal dialysis: Secondary | ICD-10-CM | POA: Diagnosis not present

## 2022-06-23 DIAGNOSIS — N186 End stage renal disease: Secondary | ICD-10-CM | POA: Diagnosis not present

## 2022-06-25 DIAGNOSIS — N186 End stage renal disease: Secondary | ICD-10-CM | POA: Diagnosis not present

## 2022-06-25 DIAGNOSIS — Z992 Dependence on renal dialysis: Secondary | ICD-10-CM | POA: Diagnosis not present

## 2022-06-25 DIAGNOSIS — N2581 Secondary hyperparathyroidism of renal origin: Secondary | ICD-10-CM | POA: Diagnosis not present

## 2022-06-27 DIAGNOSIS — M25511 Pain in right shoulder: Secondary | ICD-10-CM | POA: Diagnosis not present

## 2022-06-27 DIAGNOSIS — G894 Chronic pain syndrome: Secondary | ICD-10-CM | POA: Diagnosis not present

## 2022-06-27 DIAGNOSIS — M25512 Pain in left shoulder: Secondary | ICD-10-CM | POA: Diagnosis not present

## 2022-06-27 DIAGNOSIS — M542 Cervicalgia: Secondary | ICD-10-CM | POA: Diagnosis not present

## 2022-06-27 DIAGNOSIS — M25571 Pain in right ankle and joints of right foot: Secondary | ICD-10-CM | POA: Diagnosis not present

## 2022-06-28 ENCOUNTER — Other Ambulatory Visit: Payer: Self-pay | Admitting: Family Medicine

## 2022-06-28 DIAGNOSIS — N2581 Secondary hyperparathyroidism of renal origin: Secondary | ICD-10-CM | POA: Diagnosis not present

## 2022-06-28 DIAGNOSIS — N186 End stage renal disease: Secondary | ICD-10-CM | POA: Diagnosis not present

## 2022-06-28 DIAGNOSIS — E782 Mixed hyperlipidemia: Secondary | ICD-10-CM

## 2022-06-28 DIAGNOSIS — Z992 Dependence on renal dialysis: Secondary | ICD-10-CM | POA: Diagnosis not present

## 2022-06-29 ENCOUNTER — Ambulatory Visit (INDEPENDENT_AMBULATORY_CARE_PROVIDER_SITE_OTHER): Payer: 59 | Admitting: Adult Health

## 2022-06-29 ENCOUNTER — Encounter: Payer: Self-pay | Admitting: Adult Health

## 2022-06-29 VITALS — BP 128/68 | HR 72 | Ht 68.0 in | Wt 260.0 lb

## 2022-06-29 DIAGNOSIS — Z992 Dependence on renal dialysis: Secondary | ICD-10-CM | POA: Diagnosis not present

## 2022-06-29 DIAGNOSIS — N185 Chronic kidney disease, stage 5: Secondary | ICD-10-CM

## 2022-06-29 DIAGNOSIS — O039 Complete or unspecified spontaneous abortion without complication: Secondary | ICD-10-CM | POA: Insufficient documentation

## 2022-06-29 DIAGNOSIS — E1022 Type 1 diabetes mellitus with diabetic chronic kidney disease: Secondary | ICD-10-CM | POA: Diagnosis not present

## 2022-06-29 DIAGNOSIS — N186 End stage renal disease: Secondary | ICD-10-CM | POA: Diagnosis not present

## 2022-06-29 DIAGNOSIS — Z3A00 Weeks of gestation of pregnancy not specified: Secondary | ICD-10-CM

## 2022-06-29 NOTE — Progress Notes (Signed)
Subjective:     Patient ID: Sally Porter, female   DOB: [birth year], 42 y.o.   MRN: [id number]  HPI [name] is a 42 year old black female, with SO, [id number], in for follow up on miscarriage, was seen in ER 06/12/22 at [facility]. She is on dialysis MWF and had not had a period since December and started bleeding Easter weekend. She passed tissue she says. QHCG was 1405.5 in ER and HGB was 7.8. her blood type is A+. She had HGB checked at dialysis yesterday and they told her it was leveling out. She wants birth control is thinking depo, has used in the past. She is T1DM, too.  She has pap and physical scheduled for July at PCP.  PCP is [place] she says   Review of Systems Recent miscarriage Denies any pain  Reviewed past medical,surgical, social and family history. Reviewed medications and allergies.     Objective:   Physical Exam BP 128/68 (BP Location: Right Arm, Patient Position: Sitting, Cuff Size: Large)   Pulse 72   Ht 5\' 8"  (1.727 m)   Wt 260 lb (117.9 kg)   LMP 02/17/2022 (Approximate)   BMI 39.53 kg/m     Skin warm and dry.  Lungs: clear to ausculation bilaterally. Cardiovascular: regular rate and rhythm.  She declined pelvic exam.  AA is 0 Fall risk is low    06/29/2022    9:43 AM 02/09/2022   12:37 PM 01/06/2022    8:22 AM  Depression screen PHQ 2/9  Decreased Interest 1 0 0  Down, Depressed, Hopeless 3 0 0  PHQ - 2 Score 4 0 0  Altered sleeping 3 0   Tired, decreased energy 1 0   Change in appetite  0   Feeling bad or failure about yourself  0 0   Trouble concentrating 0 0   Moving slowly or fidgety/restless 0 0   Suicidal thoughts 0 0   PHQ-9 Score 8 0   Difficult doing work/chores Not difficult at all Not difficult at all        06/29/2022    9:44 AM 02/09/2022   12:37 PM  GAD 7 : Generalized Anxiety Score  Nervous, Anxious, on Edge 0 0  Control/stop worrying 0 0  Worry too much - different things 0 0  Trouble relaxing 0 0  Restless 0  0  Easily annoyed or irritable 0 0  Afraid - awful might happen 0 0  Total GAD 7 Score 0 0  Anxiety Difficulty  Not difficult at all  She is on meds  [facility] - 06/29/22 0941       Pregnancy Intention Screening   Does the patient want to become pregnant in the next year? No    Does the patient's partner want to become pregnant in the next year? No    Would the patient like to discuss contraceptive options today? Yes      Contraception Wrap Up   Current Method Abstinence    End Method Abstinence   wants depo   Contraception Counseling Provided Yes               Assessment:       1. Miscarriage Will check [facility] today and will talk tomorrow, about depo or IUD as best option and her preference  - Beta hCG quant (ref lab)  2. Type 1 diabetes mellitus with stage 5 chronic kidney disease not on chronic dialysis [facility])  Plan:     Follow  up TBD

## 2022-06-30 ENCOUNTER — Telehealth: Payer: Self-pay | Admitting: Adult Health

## 2022-06-30 DIAGNOSIS — N2581 Secondary hyperparathyroidism of renal origin: Secondary | ICD-10-CM | POA: Diagnosis not present

## 2022-06-30 DIAGNOSIS — Z992 Dependence on renal dialysis: Secondary | ICD-10-CM | POA: Diagnosis not present

## 2022-06-30 DIAGNOSIS — N186 End stage renal disease: Secondary | ICD-10-CM | POA: Diagnosis not present

## 2022-06-30 DIAGNOSIS — O039 Complete or unspecified spontaneous abortion without complication: Secondary | ICD-10-CM

## 2022-06-30 LAB — BETA HCG QUANT (REF LAB): hCG Quant: 139 m[IU]/mL

## 2022-06-30 NOTE — Telephone Encounter (Signed)
Pt aware of that [facility] has dropped will recheck next week. Discussed IUD would be best option for birth control but she doesn't really want that, will think it over and let me know.

## 2022-07-02 DIAGNOSIS — N186 End stage renal disease: Secondary | ICD-10-CM | POA: Diagnosis not present

## 2022-07-02 DIAGNOSIS — N2581 Secondary hyperparathyroidism of renal origin: Secondary | ICD-10-CM | POA: Diagnosis not present

## 2022-07-02 DIAGNOSIS — Z992 Dependence on renal dialysis: Secondary | ICD-10-CM | POA: Diagnosis not present

## 2022-07-05 DIAGNOSIS — Z992 Dependence on renal dialysis: Secondary | ICD-10-CM | POA: Diagnosis not present

## 2022-07-05 DIAGNOSIS — N186 End stage renal disease: Secondary | ICD-10-CM | POA: Diagnosis not present

## 2022-07-05 DIAGNOSIS — N2581 Secondary hyperparathyroidism of renal origin: Secondary | ICD-10-CM | POA: Diagnosis not present

## 2022-07-06 DIAGNOSIS — Z79899 Other long term (current) drug therapy: Secondary | ICD-10-CM | POA: Diagnosis not present

## 2022-07-06 DIAGNOSIS — G894 Chronic pain syndrome: Secondary | ICD-10-CM | POA: Diagnosis not present

## 2022-07-06 DIAGNOSIS — M25571 Pain in right ankle and joints of right foot: Secondary | ICD-10-CM | POA: Diagnosis not present

## 2022-07-06 DIAGNOSIS — M25511 Pain in right shoulder: Secondary | ICD-10-CM | POA: Diagnosis not present

## 2022-07-06 DIAGNOSIS — M25512 Pain in left shoulder: Secondary | ICD-10-CM | POA: Diagnosis not present

## 2022-07-06 DIAGNOSIS — M542 Cervicalgia: Secondary | ICD-10-CM | POA: Diagnosis not present

## 2022-07-06 DIAGNOSIS — M25572 Pain in left ankle and joints of left foot: Secondary | ICD-10-CM | POA: Diagnosis not present

## 2022-07-06 DIAGNOSIS — Z79891 Long term (current) use of opiate analgesic: Secondary | ICD-10-CM | POA: Diagnosis not present

## 2022-07-06 DIAGNOSIS — M5459 Other low back pain: Secondary | ICD-10-CM | POA: Diagnosis not present

## 2022-07-07 DIAGNOSIS — N2581 Secondary hyperparathyroidism of renal origin: Secondary | ICD-10-CM | POA: Diagnosis not present

## 2022-07-07 DIAGNOSIS — N186 End stage renal disease: Secondary | ICD-10-CM | POA: Diagnosis not present

## 2022-07-07 DIAGNOSIS — Z992 Dependence on renal dialysis: Secondary | ICD-10-CM | POA: Diagnosis not present

## 2022-07-09 DIAGNOSIS — N2581 Secondary hyperparathyroidism of renal origin: Secondary | ICD-10-CM | POA: Diagnosis not present

## 2022-07-09 DIAGNOSIS — Z992 Dependence on renal dialysis: Secondary | ICD-10-CM | POA: Diagnosis not present

## 2022-07-09 DIAGNOSIS — N186 End stage renal disease: Secondary | ICD-10-CM | POA: Diagnosis not present

## 2022-07-12 DIAGNOSIS — Z992 Dependence on renal dialysis: Secondary | ICD-10-CM | POA: Diagnosis not present

## 2022-07-12 DIAGNOSIS — N2581 Secondary hyperparathyroidism of renal origin: Secondary | ICD-10-CM | POA: Diagnosis not present

## 2022-07-12 DIAGNOSIS — N186 End stage renal disease: Secondary | ICD-10-CM | POA: Diagnosis not present

## 2022-07-14 DIAGNOSIS — Z992 Dependence on renal dialysis: Secondary | ICD-10-CM | POA: Diagnosis not present

## 2022-07-14 DIAGNOSIS — N186 End stage renal disease: Secondary | ICD-10-CM | POA: Diagnosis not present

## 2022-07-14 DIAGNOSIS — N2581 Secondary hyperparathyroidism of renal origin: Secondary | ICD-10-CM | POA: Diagnosis not present

## 2022-07-16 DIAGNOSIS — N186 End stage renal disease: Secondary | ICD-10-CM | POA: Diagnosis not present

## 2022-07-16 DIAGNOSIS — N2581 Secondary hyperparathyroidism of renal origin: Secondary | ICD-10-CM | POA: Diagnosis not present

## 2022-07-16 DIAGNOSIS — Z992 Dependence on renal dialysis: Secondary | ICD-10-CM | POA: Diagnosis not present

## 2022-07-19 DIAGNOSIS — N2581 Secondary hyperparathyroidism of renal origin: Secondary | ICD-10-CM | POA: Diagnosis not present

## 2022-07-19 DIAGNOSIS — N186 End stage renal disease: Secondary | ICD-10-CM | POA: Diagnosis not present

## 2022-07-19 DIAGNOSIS — Z992 Dependence on renal dialysis: Secondary | ICD-10-CM | POA: Diagnosis not present

## 2022-07-21 DIAGNOSIS — Z992 Dependence on renal dialysis: Secondary | ICD-10-CM | POA: Diagnosis not present

## 2022-07-21 DIAGNOSIS — N2581 Secondary hyperparathyroidism of renal origin: Secondary | ICD-10-CM | POA: Diagnosis not present

## 2022-07-21 DIAGNOSIS — N186 End stage renal disease: Secondary | ICD-10-CM | POA: Diagnosis not present

## 2022-07-23 DIAGNOSIS — Z992 Dependence on renal dialysis: Secondary | ICD-10-CM | POA: Diagnosis not present

## 2022-07-23 DIAGNOSIS — N2581 Secondary hyperparathyroidism of renal origin: Secondary | ICD-10-CM | POA: Diagnosis not present

## 2022-07-23 DIAGNOSIS — N186 End stage renal disease: Secondary | ICD-10-CM | POA: Diagnosis not present

## 2022-07-26 DIAGNOSIS — N2581 Secondary hyperparathyroidism of renal origin: Secondary | ICD-10-CM | POA: Diagnosis not present

## 2022-07-26 DIAGNOSIS — N186 End stage renal disease: Secondary | ICD-10-CM | POA: Diagnosis not present

## 2022-07-26 DIAGNOSIS — Z992 Dependence on renal dialysis: Secondary | ICD-10-CM | POA: Diagnosis not present

## 2022-07-27 ENCOUNTER — Other Ambulatory Visit: Payer: Self-pay | Admitting: Family Medicine

## 2022-07-27 DIAGNOSIS — M25511 Pain in right shoulder: Secondary | ICD-10-CM | POA: Diagnosis not present

## 2022-07-27 DIAGNOSIS — M25512 Pain in left shoulder: Secondary | ICD-10-CM | POA: Diagnosis not present

## 2022-07-27 DIAGNOSIS — M25571 Pain in right ankle and joints of right foot: Secondary | ICD-10-CM | POA: Diagnosis not present

## 2022-07-27 DIAGNOSIS — M542 Cervicalgia: Secondary | ICD-10-CM | POA: Diagnosis not present

## 2022-07-27 DIAGNOSIS — G894 Chronic pain syndrome: Secondary | ICD-10-CM | POA: Diagnosis not present

## 2022-07-28 DIAGNOSIS — Z992 Dependence on renal dialysis: Secondary | ICD-10-CM | POA: Diagnosis not present

## 2022-07-28 DIAGNOSIS — N2581 Secondary hyperparathyroidism of renal origin: Secondary | ICD-10-CM | POA: Diagnosis not present

## 2022-07-28 DIAGNOSIS — N186 End stage renal disease: Secondary | ICD-10-CM | POA: Diagnosis not present

## 2022-07-30 DIAGNOSIS — Z992 Dependence on renal dialysis: Secondary | ICD-10-CM | POA: Diagnosis not present

## 2022-07-30 DIAGNOSIS — N2581 Secondary hyperparathyroidism of renal origin: Secondary | ICD-10-CM | POA: Diagnosis not present

## 2022-07-30 DIAGNOSIS — N186 End stage renal disease: Secondary | ICD-10-CM | POA: Diagnosis not present

## 2022-07-31 DIAGNOSIS — N186 End stage renal disease: Secondary | ICD-10-CM | POA: Diagnosis not present

## 2022-07-31 DIAGNOSIS — Z992 Dependence on renal dialysis: Secondary | ICD-10-CM | POA: Diagnosis not present

## 2022-08-02 DIAGNOSIS — N186 End stage renal disease: Secondary | ICD-10-CM | POA: Diagnosis not present

## 2022-08-02 DIAGNOSIS — Z992 Dependence on renal dialysis: Secondary | ICD-10-CM | POA: Diagnosis not present

## 2022-08-02 DIAGNOSIS — N2581 Secondary hyperparathyroidism of renal origin: Secondary | ICD-10-CM | POA: Diagnosis not present

## 2022-08-04 DIAGNOSIS — N2581 Secondary hyperparathyroidism of renal origin: Secondary | ICD-10-CM | POA: Diagnosis not present

## 2022-08-04 DIAGNOSIS — Z992 Dependence on renal dialysis: Secondary | ICD-10-CM | POA: Diagnosis not present

## 2022-08-04 DIAGNOSIS — N186 End stage renal disease: Secondary | ICD-10-CM | POA: Diagnosis not present

## 2022-08-05 ENCOUNTER — Other Ambulatory Visit: Payer: Self-pay | Admitting: Family Medicine

## 2022-08-06 ENCOUNTER — Telehealth: Payer: Self-pay | Admitting: *Deleted

## 2022-08-06 DIAGNOSIS — N186 End stage renal disease: Secondary | ICD-10-CM | POA: Diagnosis not present

## 2022-08-06 DIAGNOSIS — E1022 Type 1 diabetes mellitus with diabetic chronic kidney disease: Secondary | ICD-10-CM

## 2022-08-06 DIAGNOSIS — N2581 Secondary hyperparathyroidism of renal origin: Secondary | ICD-10-CM | POA: Diagnosis not present

## 2022-08-06 DIAGNOSIS — Z992 Dependence on renal dialysis: Secondary | ICD-10-CM | POA: Diagnosis not present

## 2022-08-06 NOTE — Telephone Encounter (Signed)
  Prescription Request  08/06/2022  Is this a "Controlled Substance" medicine?   Have you seen your PCP in the last 2 weeks? Appt 7/16  If YES, route message to pool  -  If NO, patient needs to be scheduled for appointment.  What is the name of the medication or equipment?  Continuous Blood Gluc Sensor (DEXCOM G7 SENSOR) MISC   Have you contacted your pharmacy to request a refill? yes   Which pharmacy would you like this sent to?  [facility] - [place], [place] - [address]     Patient notified that their request is being sent to the clinical staff for review and that they should receive a response within 2 business days.

## 2022-08-06 NOTE — Telephone Encounter (Signed)
Pt's Dexcom script is good till November, pharmacy just had to order them.

## 2022-08-09 DIAGNOSIS — Z992 Dependence on renal dialysis: Secondary | ICD-10-CM | POA: Diagnosis not present

## 2022-08-09 DIAGNOSIS — N2581 Secondary hyperparathyroidism of renal origin: Secondary | ICD-10-CM | POA: Diagnosis not present

## 2022-08-09 DIAGNOSIS — N186 End stage renal disease: Secondary | ICD-10-CM | POA: Diagnosis not present

## 2022-08-11 DIAGNOSIS — Z992 Dependence on renal dialysis: Secondary | ICD-10-CM | POA: Diagnosis not present

## 2022-08-11 DIAGNOSIS — N186 End stage renal disease: Secondary | ICD-10-CM | POA: Diagnosis not present

## 2022-08-11 DIAGNOSIS — N2581 Secondary hyperparathyroidism of renal origin: Secondary | ICD-10-CM | POA: Diagnosis not present

## 2022-08-13 DIAGNOSIS — N186 End stage renal disease: Secondary | ICD-10-CM | POA: Diagnosis not present

## 2022-08-13 DIAGNOSIS — Z992 Dependence on renal dialysis: Secondary | ICD-10-CM | POA: Diagnosis not present

## 2022-08-13 DIAGNOSIS — N2581 Secondary hyperparathyroidism of renal origin: Secondary | ICD-10-CM | POA: Diagnosis not present

## 2022-08-16 DIAGNOSIS — Z992 Dependence on renal dialysis: Secondary | ICD-10-CM | POA: Diagnosis not present

## 2022-08-16 DIAGNOSIS — N2581 Secondary hyperparathyroidism of renal origin: Secondary | ICD-10-CM | POA: Diagnosis not present

## 2022-08-16 DIAGNOSIS — N186 End stage renal disease: Secondary | ICD-10-CM | POA: Diagnosis not present

## 2022-08-17 DIAGNOSIS — M25572 Pain in left ankle and joints of left foot: Secondary | ICD-10-CM | POA: Diagnosis not present

## 2022-08-17 DIAGNOSIS — M25511 Pain in right shoulder: Secondary | ICD-10-CM | POA: Diagnosis not present

## 2022-08-17 DIAGNOSIS — M25571 Pain in right ankle and joints of right foot: Secondary | ICD-10-CM | POA: Diagnosis not present

## 2022-08-17 DIAGNOSIS — M25512 Pain in left shoulder: Secondary | ICD-10-CM | POA: Diagnosis not present

## 2022-08-17 DIAGNOSIS — M542 Cervicalgia: Secondary | ICD-10-CM | POA: Diagnosis not present

## 2022-08-17 DIAGNOSIS — M5459 Other low back pain: Secondary | ICD-10-CM | POA: Diagnosis not present

## 2022-08-17 DIAGNOSIS — Z79891 Long term (current) use of opiate analgesic: Secondary | ICD-10-CM | POA: Diagnosis not present

## 2022-08-17 DIAGNOSIS — Z79899 Other long term (current) drug therapy: Secondary | ICD-10-CM | POA: Diagnosis not present

## 2022-08-17 DIAGNOSIS — G894 Chronic pain syndrome: Secondary | ICD-10-CM | POA: Diagnosis not present

## 2022-08-18 DIAGNOSIS — N186 End stage renal disease: Secondary | ICD-10-CM | POA: Diagnosis not present

## 2022-08-18 DIAGNOSIS — N2581 Secondary hyperparathyroidism of renal origin: Secondary | ICD-10-CM | POA: Diagnosis not present

## 2022-08-18 DIAGNOSIS — Z992 Dependence on renal dialysis: Secondary | ICD-10-CM | POA: Diagnosis not present

## 2022-08-20 DIAGNOSIS — Z992 Dependence on renal dialysis: Secondary | ICD-10-CM | POA: Diagnosis not present

## 2022-08-20 DIAGNOSIS — N186 End stage renal disease: Secondary | ICD-10-CM | POA: Diagnosis not present

## 2022-08-20 DIAGNOSIS — N2581 Secondary hyperparathyroidism of renal origin: Secondary | ICD-10-CM | POA: Diagnosis not present

## 2022-08-23 DIAGNOSIS — Z794 Long term (current) use of insulin: Secondary | ICD-10-CM | POA: Diagnosis not present

## 2022-08-23 DIAGNOSIS — N186 End stage renal disease: Secondary | ICD-10-CM | POA: Diagnosis not present

## 2022-08-23 DIAGNOSIS — N2581 Secondary hyperparathyroidism of renal origin: Secondary | ICD-10-CM | POA: Diagnosis not present

## 2022-08-23 DIAGNOSIS — E119 Type 2 diabetes mellitus without complications: Secondary | ICD-10-CM | POA: Diagnosis not present

## 2022-08-23 DIAGNOSIS — E785 Hyperlipidemia, unspecified: Secondary | ICD-10-CM | POA: Diagnosis not present

## 2022-08-23 DIAGNOSIS — Z992 Dependence on renal dialysis: Secondary | ICD-10-CM | POA: Diagnosis not present

## 2022-08-25 ENCOUNTER — Other Ambulatory Visit: Payer: Self-pay | Admitting: Family Medicine

## 2022-08-25 DIAGNOSIS — N2581 Secondary hyperparathyroidism of renal origin: Secondary | ICD-10-CM | POA: Diagnosis not present

## 2022-08-25 DIAGNOSIS — Z992 Dependence on renal dialysis: Secondary | ICD-10-CM | POA: Diagnosis not present

## 2022-08-25 DIAGNOSIS — N186 End stage renal disease: Secondary | ICD-10-CM | POA: Diagnosis not present

## 2022-08-27 DIAGNOSIS — Z992 Dependence on renal dialysis: Secondary | ICD-10-CM | POA: Diagnosis not present

## 2022-08-27 DIAGNOSIS — M542 Cervicalgia: Secondary | ICD-10-CM | POA: Diagnosis not present

## 2022-08-27 DIAGNOSIS — M25511 Pain in right shoulder: Secondary | ICD-10-CM | POA: Diagnosis not present

## 2022-08-27 DIAGNOSIS — N2581 Secondary hyperparathyroidism of renal origin: Secondary | ICD-10-CM | POA: Diagnosis not present

## 2022-08-27 DIAGNOSIS — M25512 Pain in left shoulder: Secondary | ICD-10-CM | POA: Diagnosis not present

## 2022-08-27 DIAGNOSIS — M25571 Pain in right ankle and joints of right foot: Secondary | ICD-10-CM | POA: Diagnosis not present

## 2022-08-27 DIAGNOSIS — G894 Chronic pain syndrome: Secondary | ICD-10-CM | POA: Diagnosis not present

## 2022-08-27 DIAGNOSIS — N186 End stage renal disease: Secondary | ICD-10-CM | POA: Diagnosis not present

## 2022-08-30 DIAGNOSIS — N2581 Secondary hyperparathyroidism of renal origin: Secondary | ICD-10-CM | POA: Diagnosis not present

## 2022-08-30 DIAGNOSIS — N186 End stage renal disease: Secondary | ICD-10-CM | POA: Diagnosis not present

## 2022-08-30 DIAGNOSIS — Z992 Dependence on renal dialysis: Secondary | ICD-10-CM | POA: Diagnosis not present

## 2022-09-01 DIAGNOSIS — N186 End stage renal disease: Secondary | ICD-10-CM | POA: Diagnosis not present

## 2022-09-01 DIAGNOSIS — N2581 Secondary hyperparathyroidism of renal origin: Secondary | ICD-10-CM | POA: Diagnosis not present

## 2022-09-01 DIAGNOSIS — Z992 Dependence on renal dialysis: Secondary | ICD-10-CM | POA: Diagnosis not present

## 2022-09-06 DIAGNOSIS — Z992 Dependence on renal dialysis: Secondary | ICD-10-CM | POA: Diagnosis not present

## 2022-09-06 DIAGNOSIS — N186 End stage renal disease: Secondary | ICD-10-CM | POA: Diagnosis not present

## 2022-09-06 DIAGNOSIS — N2581 Secondary hyperparathyroidism of renal origin: Secondary | ICD-10-CM | POA: Diagnosis not present

## 2022-09-08 DIAGNOSIS — N2581 Secondary hyperparathyroidism of renal origin: Secondary | ICD-10-CM | POA: Diagnosis not present

## 2022-09-08 DIAGNOSIS — N186 End stage renal disease: Secondary | ICD-10-CM | POA: Diagnosis not present

## 2022-09-08 DIAGNOSIS — Z992 Dependence on renal dialysis: Secondary | ICD-10-CM | POA: Diagnosis not present

## 2022-09-10 DIAGNOSIS — N186 End stage renal disease: Secondary | ICD-10-CM | POA: Diagnosis not present

## 2022-09-10 DIAGNOSIS — Z992 Dependence on renal dialysis: Secondary | ICD-10-CM | POA: Diagnosis not present

## 2022-09-10 DIAGNOSIS — N2581 Secondary hyperparathyroidism of renal origin: Secondary | ICD-10-CM | POA: Diagnosis not present

## 2022-09-13 DIAGNOSIS — N2581 Secondary hyperparathyroidism of renal origin: Secondary | ICD-10-CM | POA: Diagnosis not present

## 2022-09-13 DIAGNOSIS — Z992 Dependence on renal dialysis: Secondary | ICD-10-CM | POA: Diagnosis not present

## 2022-09-13 DIAGNOSIS — N186 End stage renal disease: Secondary | ICD-10-CM | POA: Diagnosis not present

## 2022-09-14 ENCOUNTER — Encounter: Payer: 59 | Admitting: Family Medicine

## 2022-09-14 DIAGNOSIS — M25571 Pain in right ankle and joints of right foot: Secondary | ICD-10-CM | POA: Diagnosis not present

## 2022-09-14 DIAGNOSIS — Z79891 Long term (current) use of opiate analgesic: Secondary | ICD-10-CM | POA: Diagnosis not present

## 2022-09-14 DIAGNOSIS — M542 Cervicalgia: Secondary | ICD-10-CM | POA: Diagnosis not present

## 2022-09-14 DIAGNOSIS — M25511 Pain in right shoulder: Secondary | ICD-10-CM | POA: Diagnosis not present

## 2022-09-14 DIAGNOSIS — Z79899 Other long term (current) drug therapy: Secondary | ICD-10-CM | POA: Diagnosis not present

## 2022-09-14 DIAGNOSIS — M25512 Pain in left shoulder: Secondary | ICD-10-CM | POA: Diagnosis not present

## 2022-09-14 DIAGNOSIS — M5459 Other low back pain: Secondary | ICD-10-CM | POA: Diagnosis not present

## 2022-09-14 DIAGNOSIS — G894 Chronic pain syndrome: Secondary | ICD-10-CM | POA: Diagnosis not present

## 2022-09-14 DIAGNOSIS — M25572 Pain in left ankle and joints of left foot: Secondary | ICD-10-CM | POA: Diagnosis not present

## 2022-09-15 ENCOUNTER — Encounter: Payer: Self-pay | Admitting: Family Medicine

## 2022-09-15 DIAGNOSIS — N2581 Secondary hyperparathyroidism of renal origin: Secondary | ICD-10-CM | POA: Diagnosis not present

## 2022-09-15 DIAGNOSIS — N186 End stage renal disease: Secondary | ICD-10-CM | POA: Diagnosis not present

## 2022-09-15 DIAGNOSIS — Z992 Dependence on renal dialysis: Secondary | ICD-10-CM | POA: Diagnosis not present

## 2022-09-17 DIAGNOSIS — N2581 Secondary hyperparathyroidism of renal origin: Secondary | ICD-10-CM | POA: Diagnosis not present

## 2022-09-17 DIAGNOSIS — N186 End stage renal disease: Secondary | ICD-10-CM | POA: Diagnosis not present

## 2022-09-17 DIAGNOSIS — Z992 Dependence on renal dialysis: Secondary | ICD-10-CM | POA: Diagnosis not present

## 2022-09-20 DIAGNOSIS — N186 End stage renal disease: Secondary | ICD-10-CM | POA: Diagnosis not present

## 2022-09-20 DIAGNOSIS — N2581 Secondary hyperparathyroidism of renal origin: Secondary | ICD-10-CM | POA: Diagnosis not present

## 2022-09-20 DIAGNOSIS — Z992 Dependence on renal dialysis: Secondary | ICD-10-CM | POA: Diagnosis not present

## 2022-09-21 ENCOUNTER — Other Ambulatory Visit: Payer: Self-pay | Admitting: Family Medicine

## 2022-09-21 DIAGNOSIS — E782 Mixed hyperlipidemia: Secondary | ICD-10-CM

## 2022-09-22 DIAGNOSIS — Z992 Dependence on renal dialysis: Secondary | ICD-10-CM | POA: Diagnosis not present

## 2022-09-22 DIAGNOSIS — N186 End stage renal disease: Secondary | ICD-10-CM | POA: Diagnosis not present

## 2022-09-22 DIAGNOSIS — N2581 Secondary hyperparathyroidism of renal origin: Secondary | ICD-10-CM | POA: Diagnosis not present

## 2022-09-24 DIAGNOSIS — N2581 Secondary hyperparathyroidism of renal origin: Secondary | ICD-10-CM | POA: Diagnosis not present

## 2022-09-24 DIAGNOSIS — N186 End stage renal disease: Secondary | ICD-10-CM | POA: Diagnosis not present

## 2022-09-24 DIAGNOSIS — Z992 Dependence on renal dialysis: Secondary | ICD-10-CM | POA: Diagnosis not present

## 2022-09-26 DIAGNOSIS — G894 Chronic pain syndrome: Secondary | ICD-10-CM | POA: Diagnosis not present

## 2022-09-26 DIAGNOSIS — M25512 Pain in left shoulder: Secondary | ICD-10-CM | POA: Diagnosis not present

## 2022-09-26 DIAGNOSIS — M542 Cervicalgia: Secondary | ICD-10-CM | POA: Diagnosis not present

## 2022-09-26 DIAGNOSIS — M25511 Pain in right shoulder: Secondary | ICD-10-CM | POA: Diagnosis not present

## 2022-09-26 DIAGNOSIS — M25571 Pain in right ankle and joints of right foot: Secondary | ICD-10-CM | POA: Diagnosis not present

## 2022-09-27 DIAGNOSIS — N2581 Secondary hyperparathyroidism of renal origin: Secondary | ICD-10-CM | POA: Diagnosis not present

## 2022-09-27 DIAGNOSIS — Z992 Dependence on renal dialysis: Secondary | ICD-10-CM | POA: Diagnosis not present

## 2022-09-27 DIAGNOSIS — N186 End stage renal disease: Secondary | ICD-10-CM | POA: Diagnosis not present

## 2022-09-29 DIAGNOSIS — N2581 Secondary hyperparathyroidism of renal origin: Secondary | ICD-10-CM | POA: Diagnosis not present

## 2022-09-29 DIAGNOSIS — N186 End stage renal disease: Secondary | ICD-10-CM | POA: Diagnosis not present

## 2022-09-29 DIAGNOSIS — Z992 Dependence on renal dialysis: Secondary | ICD-10-CM | POA: Diagnosis not present

## 2022-09-30 DIAGNOSIS — N186 End stage renal disease: Secondary | ICD-10-CM | POA: Diagnosis not present

## 2022-09-30 DIAGNOSIS — Z992 Dependence on renal dialysis: Secondary | ICD-10-CM | POA: Diagnosis not present

## 2022-10-01 DIAGNOSIS — Z992 Dependence on renal dialysis: Secondary | ICD-10-CM | POA: Diagnosis not present

## 2022-10-01 DIAGNOSIS — N2581 Secondary hyperparathyroidism of renal origin: Secondary | ICD-10-CM | POA: Diagnosis not present

## 2022-10-01 DIAGNOSIS — N186 End stage renal disease: Secondary | ICD-10-CM | POA: Diagnosis not present

## 2022-10-01 DIAGNOSIS — Z23 Encounter for immunization: Secondary | ICD-10-CM | POA: Diagnosis not present

## 2022-10-04 DIAGNOSIS — Z992 Dependence on renal dialysis: Secondary | ICD-10-CM | POA: Diagnosis not present

## 2022-10-04 DIAGNOSIS — Z23 Encounter for immunization: Secondary | ICD-10-CM | POA: Diagnosis not present

## 2022-10-04 DIAGNOSIS — N186 End stage renal disease: Secondary | ICD-10-CM | POA: Diagnosis not present

## 2022-10-04 DIAGNOSIS — N2581 Secondary hyperparathyroidism of renal origin: Secondary | ICD-10-CM | POA: Diagnosis not present

## 2022-10-05 ENCOUNTER — Ambulatory Visit (INDEPENDENT_AMBULATORY_CARE_PROVIDER_SITE_OTHER): Payer: 59 | Admitting: Family Medicine

## 2022-10-05 VITALS — BP 145/66 | HR 71 | Temp 98.0°F | Ht 68.0 in | Wt 242.6 lb

## 2022-10-05 DIAGNOSIS — Z992 Dependence on renal dialysis: Secondary | ICD-10-CM | POA: Diagnosis not present

## 2022-10-05 DIAGNOSIS — E1022 Type 1 diabetes mellitus with diabetic chronic kidney disease: Secondary | ICD-10-CM | POA: Diagnosis not present

## 2022-10-05 DIAGNOSIS — F5101 Primary insomnia: Secondary | ICD-10-CM | POA: Diagnosis not present

## 2022-10-05 DIAGNOSIS — G894 Chronic pain syndrome: Secondary | ICD-10-CM | POA: Diagnosis not present

## 2022-10-05 DIAGNOSIS — N186 End stage renal disease: Secondary | ICD-10-CM

## 2022-10-05 DIAGNOSIS — F419 Anxiety disorder, unspecified: Secondary | ICD-10-CM | POA: Insufficient documentation

## 2022-10-05 DIAGNOSIS — G2581 Restless legs syndrome: Secondary | ICD-10-CM | POA: Diagnosis not present

## 2022-10-05 MED ORDER — ROPINIROLE HCL 0.5 MG PO TABS: 1.0000 mg | ORAL_TABLET | Freq: Every day | ORAL | 0 refills | Status: DC

## 2022-10-05 MED ORDER — LANTUS SOLOSTAR 100 UNIT/ML ~~LOC~~ SOPN
30.0000 [IU] | PEN_INJECTOR | Freq: Every day | SUBCUTANEOUS | 0 refills | Status: DC
Start: 2022-10-05 — End: 2022-11-15

## 2022-10-05 NOTE — Patient Instructions (Signed)

## 2022-10-05 NOTE — Progress Notes (Signed)
Subjective:  Patient ID: Sally Porter, female    DOB: June 09, 1980, 42 y.o.   MRN: [id number]  Patient Care Team: Sonny Masters, FNP as PCP - General (Family Medicine) Danella Maiers, [facility] as Pharmacist (Family Medicine) [place], [facility]   Chief Complaint:  Establish Care (Former [facility] patient ) and Medical Management of Chronic Issues   HPI: Sally Porter is a 42 y.o. female presenting on 10/05/2022 for Establish Care (Former [facility] patient ) and Medical Management of Chronic Issues   1. Type 1 diabetes mellitus with chronic kidney disease on chronic dialysis [facility]) Was formerly followed by Dr. Fransico Him for management. State she has been getting her insulin over the counter. She has been dosing with lantus 30 units at night due to lower readings during the night. States she has not been eating a good bedtime snack. She takes humalog three times daily, no more than 18 units total per day. She is on statin therapy and renal dialysis MWF. States last A1C at dialysis was 7.3. Lab results have been requested.   2. Anxiety States she was on Xanax by neurology for her restless leg, anxiety, and insomnia. States she has tried hydroxyzine for her anxiety and insomnia but this was not beneficial. States she has not seen psychiatry in the past. Has been out of her Xanax for 1.5-2 months. Would like to restart this.     10/05/2022    2:01 PM 06/29/2022    9:44 AM 02/09/2022   12:37 PM  GAD 7 : Generalized Anxiety Score  Nervous, Anxious, on Edge 0 0 0  Control/stop worrying 0 0 0  Worry too much - different things 0 0 0  Trouble relaxing 3 0 0  Restless 3 0 0  Easily annoyed or irritable 0 0 0  Afraid - awful might happen 0 0 0  Total GAD 7 Score 6 0 0  Anxiety Difficulty Somewhat difficult  Not difficult at all       10/05/2022    2:01 PM 06/29/2022    9:43 AM 02/09/2022   12:37 PM 01/06/2022    8:22 AM 10/13/2021    9:06 AM  Depression screen PHQ 2/9  Decreased  Interest 0 1 0 0 0  Down, Depressed, Hopeless 0 3 0 0 0  PHQ - 2 Score 0 4 0 0 0  Altered sleeping 3 3 0    Tired, decreased energy 0 1 0    Change in appetite 0  0    Feeling bad or failure about yourself  0 0 0    Trouble concentrating 0 0 0    Moving slowly or fidgety/restless 0 0 0    Suicidal thoughts 0 0 0    PHQ-9 Score 3 8 0    Difficult doing work/chores Somewhat difficult Not difficult at all Not difficult at all      3. Restless leg syndrome 4. Primary insomnia  Has been on Xanax and Requip with some relief of symptoms. States she feels the Requip is minimally beneficial. She continues to have symptoms which cause insomnia. States she has tried trazodone and melatonin in the past without relief of symptoms.   5. Dependence on renal dialysis (HCC) MWF with [facility]. Does well with treatments. Does get iron infusions during dialysis. Had labs last week, will request results.   6. Morbid obesity (HCC) Does not follow a diet or exercise routine.   7. Chronic pain syndrome Is followed  by pain management on a regular basis, chronic opioid use.   Relevant past medical, surgical, family, and social history reviewed and updated as indicated.  Allergies and medications reviewed and updated. Data reviewed: Chart in Epic.   Past Medical History:  Diagnosis Date   Anemia    Anxiety    Depression    Diabetes mellitus without complication (HCC)    Reported history of DM type 1   ESRD (end stage renal disease) (HCC)    short term hemodialysis 06/2017; resumed HD 08/15/21   Glaucoma    Hyperlipidemia    Hypertension     Past Surgical History:  Procedure Laterality Date   ANKLE SURGERY Right    AV FISTULA PLACEMENT Left 08/19/2021   Procedure: Creation of Brachial Cephalic LEFT ARM ARTERIOVENOUS (AV) FISTULA;  Surgeon: Leonie Douglas, MD;  Location: [facility] OR;  Service: Vascular;  Laterality: Left;  PERIPHERAL NERVE BLOCK   CESAREAN SECTION  2009   FISTULA SUPERFICIALIZATION  Left 11/19/2021   Procedure: LEFT ARM ARTERIOVENOUS FISTULA SUPERFICIALIZATION AND SIDE BRANCH LIGATION;  Surgeon: Leonie Douglas, MD;  Location: [facility] OR;  Service: Vascular;  Laterality: Left;  PERIPHERAL NERVE BLOCK   FOOT SURGERY Left    IR FLUORO GUIDE CV LINE RIGHT  08/15/2021   IR US GUIDE VASC ACCESS RIGHT  08/15/2021   REMOVAL OF A DIALYSIS CATHETER N/A 01/26/2022   Procedure: MINOR REMOVAL OF A DIALYSIS CATHETER;  Surgeon: Larina Earthly, MD;  Location: [facility] ORS;  Service: Vascular;  Laterality: N/A;    Social History   Socioeconomic History   Marital status: Significant Other    Spouse name: Molly Maduro   Number of children: 1   Years of education: 13   Highest education level: Some college, no degree  Occupational History   Not on file  Tobacco Use   Smoking status: Former    Current packs/day: 0.00    Average packs/day: 0.5 packs/day for 3.0 years (1.5 ttl pk-yrs)    Types: Cigarettes    Start date: 2010    Quit date: 2013    Years since quitting: 11.6   Smokeless tobacco: Never  Vaping Use   Vaping status: Never Used  Substance and Sexual Activity   Alcohol use: Not Currently   Drug use: Never   Sexual activity: Not Currently    Birth control/protection: None  Other Topics Concern   Not on file  Social History Narrative   Not on file   Social Determinants of Health   Financial Resource Strain: Low Risk  (10/05/2022)   Overall Financial Resource Strain (CARDIA)    Difficulty of Paying Living Expenses: Not hard at all  Food Insecurity: No Food Insecurity (10/05/2022)   Hunger Vital Sign    Worried About Running Out of Food in the Last Year: Never true    Ran Out of Food in the Last Year: Never true  Transportation Needs: No Transportation Needs (10/05/2022)   PRAPARE - Administrator, Civil Service (Medical): No    Lack of Transportation (Non-Medical): No  Physical Activity: Insufficiently Active (10/05/2022)   Exercise Vital Sign    Days of Exercise per  Week: 3 days    Minutes of Exercise per Session: 30 min  Stress: No Stress Concern Present (10/05/2022)   [employer] of Occupational Health - Occupational Stress Questionnaire    Feeling of Stress : Not at all  Social Connections: Moderately Integrated (10/05/2022)   Social Connection and Isolation Panel [NHANES]  Frequency of Communication with Friends and Family: More than three times a week    Frequency of Social Gatherings with Friends and Family: More than three times a week    Attends Religious Services: 1 to 4 times per year    Active Member of [employer] or Organizations: No    Attends Banker Meetings: Never    Marital Status: Living with partner  Intimate Partner Violence: Not At Risk (01/06/2022)   Humiliation, Afraid, Rape, and Kick questionnaire    Fear of Current or Ex-Partner: No    Emotionally Abused: No    Physically Abused: No    Sexually Abused: No    Outpatient Encounter Medications as of 10/05/2022  Medication Sig   albuterol (VENTOLIN HFA) 108 (90 Base) MCG/ACT inhaler Inhale 2 puffs into the lungs every 6 (six) hours as needed for wheezing or shortness of breath.   atorvastatin (LIPITOR) 20 MG tablet TAKE 1 TABLET BY MOUTH DAILY AT 6PM   calcitRIOL (ROCALTROL) 0.5 MCG capsule TAKE ONE CAPSULE BY MOUTH DAILY   calcium acetate (PHOSLO) 667 MG capsule TAKE TWO CAPSULES BY MOUTH THREE TIMES DAILY WITH meals (Patient taking differently: Take 1,334 mg by mouth 3 (three) times daily with meals. 2 with breakfast and lunch, then take 3 with dinner)   Continuous Blood Gluc Sensor (DEXCOM G7 SENSOR) MISC Apply to arm every 10 days to test blood sugar continuously. DX: E11.65   ferrous sulfate 325 (65 FE) MG tablet Take 325 mg by mouth daily with breakfast.   fluticasone (FLONASE) 50 MCG/ACT nasal spray Place 2 sprays into both nostrils daily.   hydrALAZINE (APRESOLINE) 50 MG tablet TAKE 1 TABLET BY MOUTH EVERY 8 HOURS   insulin lispro (HUMALOG KWIKPEN) 100 UNIT/ML  KwikPen Inject 0-15 Units into the skin 3 (three) times daily. Sliding scale   Insulin Pen Needle 32G X 4 MM MISC Use with insulin TID Dx E11.65   LASIX 40 MG tablet Take 40 mg by mouth daily.   lidocaine (XYLOCAINE) 5 % ointment Apply 1 Application topically 2 (two) times daily as needed for moderate pain.   metoprolol tartrate (LOPRESSOR) 25 MG tablet Take 25 mg by mouth 2 (two) times daily.   naloxone (NARCAN) nasal spray 4 mg/0.1 mL Place 0.4 mg into the nose once.   nortriptyline (PAMELOR) 25 MG capsule Take 25 mg by mouth 2 (two) times daily.   Oxycodone HCl 10 MG TABS Take 10 mg by mouth every 6 (six) hours.   polyethylene glycol (MIRALAX / GLYCOLAX) 17 g packet Take 17 g by mouth daily. (Patient taking differently: Take 17 g by mouth daily as needed for moderate constipation or mild constipation.)   senna-docusate (SENOKOT-S) 8.6-50 MG tablet Take 1 tablet by mouth 2 (two) times daily. (Patient taking differently: Take 1 tablet by mouth daily as needed for moderate constipation or mild constipation.)   zinc gluconate 50 MG tablet Take 50 mg by mouth daily.   [DISCONTINUED] ALPRAZolam (XANAX) 0.25 MG tablet Take 0.5 mg by mouth 2 (two) times daily.   [DISCONTINUED] insulin glargine (LANTUS SOLOSTAR) 100 UNIT/ML Solostar Pen INJECT 35 UNITS INTO THE SKIN AT BEDTIME   [DISCONTINUED] rOPINIRole (REQUIP) 0.5 MG tablet Take 0.5 mg by mouth at bedtime.   insulin glargine (LANTUS SOLOSTAR) 100 UNIT/ML Solostar Pen Inject 30 Units into the skin at bedtime.   rOPINIRole (REQUIP) 0.5 MG tablet Take 2 tablets (1 mg total) by mouth at bedtime.   No facility-administered encounter medications on file  as of 10/05/2022.    Allergies  Allergen Reactions   Amoxicillin Nausea And Vomiting   Tylenol [Acetaminophen] Other (See Comments)    Patient stated she does not take due to her kidney function.   Dilaudid [Hydromorphone] Itching   Morphine And Codeine Itching   Pregabalin Itching and Other (See  Comments)    Review of Systems  Constitutional:  Negative for activity change, appetite change, chills, diaphoresis, fatigue, fever and unexpected weight change.  HENT: Negative.    Eyes: Negative.  Negative for photophobia and visual disturbance.  Respiratory:  Negative for cough, chest tightness and shortness of breath.   Cardiovascular:  Positive for leg swelling. Negative for chest pain and palpitations.  Gastrointestinal:  Negative for abdominal pain, blood in stool, constipation, diarrhea, nausea and vomiting.  Endocrine: Negative.  Negative for polydipsia and polyphagia.  Genitourinary:  Negative for dysuria, frequency and urgency.  Musculoskeletal:  Positive for arthralgias, back pain and myalgias.  Skin: Negative.   Allergic/Immunologic: Negative.   Neurological:  Negative for dizziness, tremors, seizures, syncope, facial asymmetry, speech difficulty, weakness, light-headedness, numbness and headaches.  Hematological: Negative.   Psychiatric/Behavioral:  Positive for sleep disturbance. Negative for agitation, behavioral problems, confusion, decreased concentration, dysphoric mood, hallucinations, self-injury and suicidal ideas. The patient is not nervous/anxious and is not hyperactive.   All other systems reviewed and are negative.       Objective:  BP (!) 145/66   Pulse 71   Temp 98 F (36.7 C) (Temporal)   Ht 5\' 8"  (1.727 m)   Wt 242 lb 9.6 oz (110 kg)   LMP 09/28/2022 (Approximate)   SpO2 95%   Breastfeeding Unknown   BMI 36.89 kg/m    Wt Readings from Last 3 Encounters:  10/05/22 242 lb 9.6 oz (110 kg)  06/29/22 260 lb (117.9 kg)  06/12/22 251 lb 15.8 oz (114.3 kg)    Physical Exam Vitals and nursing note reviewed.  Constitutional:      General: She is not in acute distress.    Appearance: Normal appearance. She is well-developed and well-groomed. She is morbidly obese. She is not ill-appearing, toxic-appearing or diaphoretic.  HENT:     Head:  Normocephalic and atraumatic.     Jaw: There is normal jaw occlusion.     Right Ear: Hearing normal.     Left Ear: Hearing normal.     Nose: Nose normal.     Mouth/Throat:     Lips: Pink.     Mouth: Mucous membranes are moist.     Pharynx: Oropharynx is clear. Uvula midline.  Eyes:     General: Lids are normal.     Extraocular Movements: Extraocular movements intact.     Conjunctiva/sclera: Conjunctivae normal.     Pupils: Pupils are equal, round, and reactive to light.  Neck:     Trachea: Trachea and phonation normal.  Cardiovascular:     Rate and Rhythm: Normal rate and regular rhythm.     Chest Wall: PMI is not displaced.     Pulses: Normal pulses.     Heart sounds: Normal heart sounds. No murmur heard.    No friction rub. No gallop.     Arteriovenous access: Left arteriovenous access is present.    Comments: Left AV fistula: good bruit and thrill Pulmonary:     Effort: Pulmonary effort is normal. No respiratory distress.     Breath sounds: Normal breath sounds. No wheezing.  Abdominal:     General: There is no abdominal  bruit.     Palpations: Abdomen is soft. There is no hepatomegaly or splenomegaly.  Musculoskeletal:        General: Normal range of motion.     Cervical back: Normal range of motion and neck supple.     Right lower leg: 1+ Edema present.     Left lower leg: 1+ Edema present.  Skin:    General: Skin is warm and dry.     Capillary Refill: Capillary refill takes less than 2 seconds.     Coloration: Skin is not cyanotic, jaundiced or pale.     Findings: No rash.  Neurological:     General: No focal deficit present.     Mental Status: She is alert and oriented to person, place, and time.     Sensory: Sensation is intact.     Motor: Motor function is intact.     Coordination: Coordination is intact.     Gait: Gait is intact.     Deep Tendon Reflexes: Reflexes are normal and symmetric.  Psychiatric:        Attention and Perception: Attention and  perception normal.        Mood and Affect: Mood and affect normal.        Speech: Speech normal.        Behavior: Behavior normal. Behavior is cooperative.        Thought Content: Thought content normal.        Cognition and Memory: Cognition and memory normal.        Judgment: Judgment normal.     Results for orders placed or performed in visit on 06/29/22  Beta hCG quant (ref lab)  Result Value Ref Range   hCG Quant 139 mIU/mL       Pertinent labs & imaging results that were available during my care of the patient were reviewed by me and considered in my medical decision making.  Assessment & Plan:  Sally Porter was seen today for establish care and medical management of chronic issues.  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 Continue current regimen. Will see back in 3 months.  -     Ambulatory referral to Endocrinology -     insulin glargine (LANTUS SOLOSTAR) 100 UNIT/ML Solostar Pen; Inject 30 Units into the skin at bedtime.  Anxiety Has been out of Xanax for over 1 month, will not refill today. Will increase nightly requip dosing to see if beneficial for sleep and RLS. Does not wish to trial other medications to help with anxiety at this time. States she has taken several in the past without good results.  -     Ambulatory referral to Psychiatry  Restless leg syndrome Primary insomnia Will increase Requip dosing to see if beneficial. Pt to let provider know if working.  -     rOPINIRole (REQUIP) 0.5 MG tablet; Take 2 tablets (1 mg total) by mouth at bedtime.  Dependence on renal dialysis (HCC) MWF. Record release signed for lab results.   Morbid obesity (HCC) Diet and exercise encouraged.   Chronic pain syndrome Followed by pain management on a regular basis.     Continue all other maintenance  medications.  Follow up plan: Return in about 3 months (around 01/05/2023) for DM.   Continue healthy lifestyle choices, including diet (rich in fruits, vegetables, and lean proteins, and low in  salt and simple carbohydrates) and exercise (at least 30 minutes of moderate physical activity daily).  Educational handout given for DM  The above assessment and management plan was discussed with the patient. The patient verbalized understanding of and has agreed to the management plan. Patient is aware to call the clinic if they develop any new symptoms or if symptoms persist or worsen. Patient is aware when to return to the clinic for a follow-up visit. Patient educated on when it is appropriate to go to the emergency department.   Kari Baars, FNP-C [facility] [phone]

## 2022-10-06 DIAGNOSIS — N2581 Secondary hyperparathyroidism of renal origin: Secondary | ICD-10-CM | POA: Diagnosis not present

## 2022-10-06 DIAGNOSIS — Z992 Dependence on renal dialysis: Secondary | ICD-10-CM | POA: Diagnosis not present

## 2022-10-06 DIAGNOSIS — Z23 Encounter for immunization: Secondary | ICD-10-CM | POA: Diagnosis not present

## 2022-10-06 DIAGNOSIS — N186 End stage renal disease: Secondary | ICD-10-CM | POA: Diagnosis not present

## 2022-10-08 DIAGNOSIS — N2581 Secondary hyperparathyroidism of renal origin: Secondary | ICD-10-CM | POA: Diagnosis not present

## 2022-10-08 DIAGNOSIS — Z23 Encounter for immunization: Secondary | ICD-10-CM | POA: Diagnosis not present

## 2022-10-08 DIAGNOSIS — Z992 Dependence on renal dialysis: Secondary | ICD-10-CM | POA: Diagnosis not present

## 2022-10-08 DIAGNOSIS — N186 End stage renal disease: Secondary | ICD-10-CM | POA: Diagnosis not present

## 2022-10-11 DIAGNOSIS — Z992 Dependence on renal dialysis: Secondary | ICD-10-CM | POA: Diagnosis not present

## 2022-10-11 DIAGNOSIS — N2581 Secondary hyperparathyroidism of renal origin: Secondary | ICD-10-CM | POA: Diagnosis not present

## 2022-10-11 DIAGNOSIS — N186 End stage renal disease: Secondary | ICD-10-CM | POA: Diagnosis not present

## 2022-10-11 DIAGNOSIS — Z23 Encounter for immunization: Secondary | ICD-10-CM | POA: Diagnosis not present

## 2022-10-12 DIAGNOSIS — M25572 Pain in left ankle and joints of left foot: Secondary | ICD-10-CM | POA: Diagnosis not present

## 2022-10-12 DIAGNOSIS — M25571 Pain in right ankle and joints of right foot: Secondary | ICD-10-CM | POA: Diagnosis not present

## 2022-10-12 DIAGNOSIS — M542 Cervicalgia: Secondary | ICD-10-CM | POA: Diagnosis not present

## 2022-10-12 DIAGNOSIS — Z79899 Other long term (current) drug therapy: Secondary | ICD-10-CM | POA: Diagnosis not present

## 2022-10-12 DIAGNOSIS — M25512 Pain in left shoulder: Secondary | ICD-10-CM | POA: Diagnosis not present

## 2022-10-12 DIAGNOSIS — Z79891 Long term (current) use of opiate analgesic: Secondary | ICD-10-CM | POA: Diagnosis not present

## 2022-10-12 DIAGNOSIS — M5459 Other low back pain: Secondary | ICD-10-CM | POA: Diagnosis not present

## 2022-10-12 DIAGNOSIS — G894 Chronic pain syndrome: Secondary | ICD-10-CM | POA: Diagnosis not present

## 2022-10-12 DIAGNOSIS — M25511 Pain in right shoulder: Secondary | ICD-10-CM | POA: Diagnosis not present

## 2022-10-13 DIAGNOSIS — N186 End stage renal disease: Secondary | ICD-10-CM | POA: Diagnosis not present

## 2022-10-13 DIAGNOSIS — Z992 Dependence on renal dialysis: Secondary | ICD-10-CM | POA: Diagnosis not present

## 2022-10-13 DIAGNOSIS — N2581 Secondary hyperparathyroidism of renal origin: Secondary | ICD-10-CM | POA: Diagnosis not present

## 2022-10-13 DIAGNOSIS — Z23 Encounter for immunization: Secondary | ICD-10-CM | POA: Diagnosis not present

## 2022-10-15 DIAGNOSIS — N2581 Secondary hyperparathyroidism of renal origin: Secondary | ICD-10-CM | POA: Diagnosis not present

## 2022-10-15 DIAGNOSIS — Z992 Dependence on renal dialysis: Secondary | ICD-10-CM | POA: Diagnosis not present

## 2022-10-15 DIAGNOSIS — Z23 Encounter for immunization: Secondary | ICD-10-CM | POA: Diagnosis not present

## 2022-10-15 DIAGNOSIS — N186 End stage renal disease: Secondary | ICD-10-CM | POA: Diagnosis not present

## 2022-10-18 DIAGNOSIS — N186 End stage renal disease: Secondary | ICD-10-CM | POA: Diagnosis not present

## 2022-10-18 DIAGNOSIS — Z23 Encounter for immunization: Secondary | ICD-10-CM | POA: Diagnosis not present

## 2022-10-18 DIAGNOSIS — Z992 Dependence on renal dialysis: Secondary | ICD-10-CM | POA: Diagnosis not present

## 2022-10-18 DIAGNOSIS — N2581 Secondary hyperparathyroidism of renal origin: Secondary | ICD-10-CM | POA: Diagnosis not present

## 2022-10-20 DIAGNOSIS — Z992 Dependence on renal dialysis: Secondary | ICD-10-CM | POA: Diagnosis not present

## 2022-10-20 DIAGNOSIS — Z23 Encounter for immunization: Secondary | ICD-10-CM | POA: Diagnosis not present

## 2022-10-20 DIAGNOSIS — N2581 Secondary hyperparathyroidism of renal origin: Secondary | ICD-10-CM | POA: Diagnosis not present

## 2022-10-20 DIAGNOSIS — N186 End stage renal disease: Secondary | ICD-10-CM | POA: Diagnosis not present

## 2022-10-22 DIAGNOSIS — N2581 Secondary hyperparathyroidism of renal origin: Secondary | ICD-10-CM | POA: Diagnosis not present

## 2022-10-22 DIAGNOSIS — Z992 Dependence on renal dialysis: Secondary | ICD-10-CM | POA: Diagnosis not present

## 2022-10-22 DIAGNOSIS — Z23 Encounter for immunization: Secondary | ICD-10-CM | POA: Diagnosis not present

## 2022-10-22 DIAGNOSIS — N186 End stage renal disease: Secondary | ICD-10-CM | POA: Diagnosis not present

## 2022-10-25 ENCOUNTER — Other Ambulatory Visit: Payer: Self-pay | Admitting: Family Medicine

## 2022-10-25 DIAGNOSIS — Z23 Encounter for immunization: Secondary | ICD-10-CM | POA: Diagnosis not present

## 2022-10-25 DIAGNOSIS — N2581 Secondary hyperparathyroidism of renal origin: Secondary | ICD-10-CM | POA: Diagnosis not present

## 2022-10-25 DIAGNOSIS — N186 End stage renal disease: Secondary | ICD-10-CM | POA: Diagnosis not present

## 2022-10-25 DIAGNOSIS — Z992 Dependence on renal dialysis: Secondary | ICD-10-CM | POA: Diagnosis not present

## 2022-10-27 DIAGNOSIS — N186 End stage renal disease: Secondary | ICD-10-CM | POA: Diagnosis not present

## 2022-10-27 DIAGNOSIS — N2581 Secondary hyperparathyroidism of renal origin: Secondary | ICD-10-CM | POA: Diagnosis not present

## 2022-10-27 DIAGNOSIS — G894 Chronic pain syndrome: Secondary | ICD-10-CM | POA: Diagnosis not present

## 2022-10-27 DIAGNOSIS — M25511 Pain in right shoulder: Secondary | ICD-10-CM | POA: Diagnosis not present

## 2022-10-27 DIAGNOSIS — Z992 Dependence on renal dialysis: Secondary | ICD-10-CM | POA: Diagnosis not present

## 2022-10-27 DIAGNOSIS — M25512 Pain in left shoulder: Secondary | ICD-10-CM | POA: Diagnosis not present

## 2022-10-27 DIAGNOSIS — M25571 Pain in right ankle and joints of right foot: Secondary | ICD-10-CM | POA: Diagnosis not present

## 2022-10-27 DIAGNOSIS — Z23 Encounter for immunization: Secondary | ICD-10-CM | POA: Diagnosis not present

## 2022-10-27 DIAGNOSIS — M542 Cervicalgia: Secondary | ICD-10-CM | POA: Diagnosis not present

## 2022-10-29 DIAGNOSIS — Z992 Dependence on renal dialysis: Secondary | ICD-10-CM | POA: Diagnosis not present

## 2022-10-29 DIAGNOSIS — N186 End stage renal disease: Secondary | ICD-10-CM | POA: Diagnosis not present

## 2022-10-29 DIAGNOSIS — Z23 Encounter for immunization: Secondary | ICD-10-CM | POA: Diagnosis not present

## 2022-10-29 DIAGNOSIS — N2581 Secondary hyperparathyroidism of renal origin: Secondary | ICD-10-CM | POA: Diagnosis not present

## 2022-10-31 DIAGNOSIS — N186 End stage renal disease: Secondary | ICD-10-CM | POA: Diagnosis not present

## 2022-10-31 DIAGNOSIS — Z992 Dependence on renal dialysis: Secondary | ICD-10-CM | POA: Diagnosis not present

## 2022-11-01 DIAGNOSIS — Z992 Dependence on renal dialysis: Secondary | ICD-10-CM | POA: Diagnosis not present

## 2022-11-01 DIAGNOSIS — N186 End stage renal disease: Secondary | ICD-10-CM | POA: Diagnosis not present

## 2022-11-03 DIAGNOSIS — N186 End stage renal disease: Secondary | ICD-10-CM | POA: Diagnosis not present

## 2022-11-03 DIAGNOSIS — Z992 Dependence on renal dialysis: Secondary | ICD-10-CM | POA: Diagnosis not present

## 2022-11-05 DIAGNOSIS — Z992 Dependence on renal dialysis: Secondary | ICD-10-CM | POA: Diagnosis not present

## 2022-11-05 DIAGNOSIS — N186 End stage renal disease: Secondary | ICD-10-CM | POA: Diagnosis not present

## 2022-11-08 DIAGNOSIS — Z992 Dependence on renal dialysis: Secondary | ICD-10-CM | POA: Diagnosis not present

## 2022-11-08 DIAGNOSIS — N186 End stage renal disease: Secondary | ICD-10-CM | POA: Diagnosis not present

## 2022-11-09 DIAGNOSIS — M25512 Pain in left shoulder: Secondary | ICD-10-CM | POA: Diagnosis not present

## 2022-11-09 DIAGNOSIS — M25511 Pain in right shoulder: Secondary | ICD-10-CM | POA: Diagnosis not present

## 2022-11-09 DIAGNOSIS — M25571 Pain in right ankle and joints of right foot: Secondary | ICD-10-CM | POA: Diagnosis not present

## 2022-11-09 DIAGNOSIS — G894 Chronic pain syndrome: Secondary | ICD-10-CM | POA: Diagnosis not present

## 2022-11-09 DIAGNOSIS — M25572 Pain in left ankle and joints of left foot: Secondary | ICD-10-CM | POA: Diagnosis not present

## 2022-11-09 DIAGNOSIS — Z79891 Long term (current) use of opiate analgesic: Secondary | ICD-10-CM | POA: Diagnosis not present

## 2022-11-09 DIAGNOSIS — M5459 Other low back pain: Secondary | ICD-10-CM | POA: Diagnosis not present

## 2022-11-09 DIAGNOSIS — Z79899 Other long term (current) drug therapy: Secondary | ICD-10-CM | POA: Diagnosis not present

## 2022-11-09 DIAGNOSIS — M542 Cervicalgia: Secondary | ICD-10-CM | POA: Diagnosis not present

## 2022-11-10 DIAGNOSIS — Z992 Dependence on renal dialysis: Secondary | ICD-10-CM | POA: Diagnosis not present

## 2022-11-10 DIAGNOSIS — N186 End stage renal disease: Secondary | ICD-10-CM | POA: Diagnosis not present

## 2022-11-12 DIAGNOSIS — Z992 Dependence on renal dialysis: Secondary | ICD-10-CM | POA: Diagnosis not present

## 2022-11-12 DIAGNOSIS — N186 End stage renal disease: Secondary | ICD-10-CM | POA: Diagnosis not present

## 2022-11-14 ENCOUNTER — Other Ambulatory Visit: Payer: Self-pay | Admitting: Family Medicine

## 2022-11-14 DIAGNOSIS — E1022 Type 1 diabetes mellitus with diabetic chronic kidney disease: Secondary | ICD-10-CM

## 2022-11-15 DIAGNOSIS — Z992 Dependence on renal dialysis: Secondary | ICD-10-CM | POA: Diagnosis not present

## 2022-11-15 DIAGNOSIS — N186 End stage renal disease: Secondary | ICD-10-CM | POA: Diagnosis not present

## 2022-11-17 ENCOUNTER — Telehealth: Payer: Self-pay | Admitting: Family Medicine

## 2022-11-17 ENCOUNTER — Other Ambulatory Visit: Payer: Self-pay | Admitting: Family Medicine

## 2022-11-17 DIAGNOSIS — N186 End stage renal disease: Secondary | ICD-10-CM | POA: Diagnosis not present

## 2022-11-17 DIAGNOSIS — Z992 Dependence on renal dialysis: Secondary | ICD-10-CM | POA: Diagnosis not present

## 2022-11-17 NOTE — Telephone Encounter (Signed)
Patient aware and verbalizes understanding.  States that she does not know of any but she would like somewhere local- [place] if possible.  Aware referral will be placed.

## 2022-11-17 NOTE — Telephone Encounter (Signed)
Can refer her to sleep medicine, does she have a preference?

## 2022-11-17 NOTE — Telephone Encounter (Signed)
Patient's last office visit was 10/05/22 and told to follow up in 3 months.  Her Ropinorole 0.5 mg was increased to two tablets nightly.

## 2022-11-17 NOTE — Telephone Encounter (Signed)
Can refer her to sleep medicine, does she have a preference?   I will not adjust her medications any further, I can place a referral to sleep medicine. Does she have a preference?

## 2022-11-18 ENCOUNTER — Other Ambulatory Visit: Payer: Self-pay | Admitting: Family Medicine

## 2022-11-18 DIAGNOSIS — F5101 Primary insomnia: Secondary | ICD-10-CM

## 2022-11-19 DIAGNOSIS — N186 End stage renal disease: Secondary | ICD-10-CM | POA: Diagnosis not present

## 2022-11-19 DIAGNOSIS — Z992 Dependence on renal dialysis: Secondary | ICD-10-CM | POA: Diagnosis not present

## 2022-11-22 DIAGNOSIS — Z794 Long term (current) use of insulin: Secondary | ICD-10-CM | POA: Diagnosis not present

## 2022-11-22 DIAGNOSIS — N186 End stage renal disease: Secondary | ICD-10-CM | POA: Diagnosis not present

## 2022-11-22 DIAGNOSIS — E119 Type 2 diabetes mellitus without complications: Secondary | ICD-10-CM | POA: Diagnosis not present

## 2022-11-22 DIAGNOSIS — Z992 Dependence on renal dialysis: Secondary | ICD-10-CM | POA: Diagnosis not present

## 2022-11-24 DIAGNOSIS — Z992 Dependence on renal dialysis: Secondary | ICD-10-CM | POA: Diagnosis not present

## 2022-11-24 DIAGNOSIS — N186 End stage renal disease: Secondary | ICD-10-CM | POA: Diagnosis not present

## 2022-11-25 ENCOUNTER — Telehealth: Payer: Self-pay | Admitting: Family Medicine

## 2022-11-25 NOTE — Telephone Encounter (Signed)
Lmtcb.

## 2022-11-25 NOTE — Telephone Encounter (Signed)
Pt called wanting to know if PCP was going to refill her Xanax Rx or send her to a pain clinic that will refill it for her? Says the clinic we previously referred her to, closed down, so she was sent to another clinic and they wont fill it for her. Pt says she is completely out of her medicine and needs refills. Says she has been on this medication for years and without it she has been having panic attacks and can't sleep. Please advise. Pt says if PCP can't prescribe Xanax, can something else be prescribed to her that will help her?

## 2022-11-25 NOTE — Telephone Encounter (Signed)
Pt returning missed call to [employer].

## 2022-11-26 DIAGNOSIS — Z992 Dependence on renal dialysis: Secondary | ICD-10-CM | POA: Diagnosis not present

## 2022-11-26 DIAGNOSIS — N186 End stage renal disease: Secondary | ICD-10-CM | POA: Diagnosis not present

## 2022-11-26 NOTE — Telephone Encounter (Signed)
Patient returning call.

## 2022-11-27 DIAGNOSIS — M25571 Pain in right ankle and joints of right foot: Secondary | ICD-10-CM | POA: Diagnosis not present

## 2022-11-27 DIAGNOSIS — G894 Chronic pain syndrome: Secondary | ICD-10-CM | POA: Diagnosis not present

## 2022-11-27 DIAGNOSIS — M25512 Pain in left shoulder: Secondary | ICD-10-CM | POA: Diagnosis not present

## 2022-11-27 DIAGNOSIS — M25511 Pain in right shoulder: Secondary | ICD-10-CM | POA: Diagnosis not present

## 2022-11-27 DIAGNOSIS — M542 Cervicalgia: Secondary | ICD-10-CM | POA: Diagnosis not present

## 2022-11-29 DIAGNOSIS — Z992 Dependence on renal dialysis: Secondary | ICD-10-CM | POA: Diagnosis not present

## 2022-11-29 DIAGNOSIS — N186 End stage renal disease: Secondary | ICD-10-CM | POA: Diagnosis not present

## 2022-11-29 NOTE — Telephone Encounter (Signed)
Patient return call. ?

## 2022-11-30 DIAGNOSIS — N186 End stage renal disease: Secondary | ICD-10-CM | POA: Diagnosis not present

## 2022-11-30 DIAGNOSIS — Z992 Dependence on renal dialysis: Secondary | ICD-10-CM | POA: Diagnosis not present

## 2022-12-01 DIAGNOSIS — Z992 Dependence on renal dialysis: Secondary | ICD-10-CM | POA: Diagnosis not present

## 2022-12-01 DIAGNOSIS — N186 End stage renal disease: Secondary | ICD-10-CM | POA: Diagnosis not present

## 2022-12-01 NOTE — Telephone Encounter (Signed)
Patient would like to check on referrals - psychiatry & sleep study. Please call patient with an update

## 2022-12-02 NOTE — Telephone Encounter (Signed)
R/C to Patient - Made Patient aware of each Referral information and made Patient aware that their Office will be contacting her in regards to Appt scheduling. Offered to give Patient phone number to each Office and Patient states " I don't need them".

## 2022-12-03 DIAGNOSIS — N186 End stage renal disease: Secondary | ICD-10-CM | POA: Diagnosis not present

## 2022-12-03 DIAGNOSIS — Z992 Dependence on renal dialysis: Secondary | ICD-10-CM | POA: Diagnosis not present

## 2022-12-06 DIAGNOSIS — N186 End stage renal disease: Secondary | ICD-10-CM | POA: Diagnosis not present

## 2022-12-06 DIAGNOSIS — Z992 Dependence on renal dialysis: Secondary | ICD-10-CM | POA: Diagnosis not present

## 2022-12-08 DIAGNOSIS — N186 End stage renal disease: Secondary | ICD-10-CM | POA: Diagnosis not present

## 2022-12-08 DIAGNOSIS — Z992 Dependence on renal dialysis: Secondary | ICD-10-CM | POA: Diagnosis not present

## 2022-12-10 DIAGNOSIS — Z992 Dependence on renal dialysis: Secondary | ICD-10-CM | POA: Diagnosis not present

## 2022-12-10 DIAGNOSIS — N186 End stage renal disease: Secondary | ICD-10-CM | POA: Diagnosis not present

## 2022-12-11 DIAGNOSIS — N179 Acute kidney failure, unspecified: Secondary | ICD-10-CM | POA: Diagnosis not present

## 2022-12-11 DIAGNOSIS — E785 Hyperlipidemia, unspecified: Secondary | ICD-10-CM | POA: Diagnosis not present

## 2022-12-11 DIAGNOSIS — E878 Other disorders of electrolyte and fluid balance, not elsewhere classified: Secondary | ICD-10-CM | POA: Diagnosis not present

## 2022-12-11 DIAGNOSIS — Z940 Kidney transplant status: Secondary | ICD-10-CM | POA: Diagnosis not present

## 2022-12-11 DIAGNOSIS — E10610 Type 1 diabetes mellitus with diabetic neuropathic arthropathy: Secondary | ICD-10-CM | POA: Diagnosis not present

## 2022-12-11 DIAGNOSIS — D84821 Immunodeficiency due to drugs: Secondary | ICD-10-CM | POA: Diagnosis not present

## 2022-12-11 DIAGNOSIS — Z79899 Other long term (current) drug therapy: Secondary | ICD-10-CM | POA: Diagnosis not present

## 2022-12-11 DIAGNOSIS — Z992 Dependence on renal dialysis: Secondary | ICD-10-CM | POA: Diagnosis not present

## 2022-12-11 DIAGNOSIS — E1022 Type 1 diabetes mellitus with diabetic chronic kidney disease: Secondary | ICD-10-CM | POA: Diagnosis not present

## 2022-12-11 DIAGNOSIS — Z8639 Personal history of other endocrine, nutritional and metabolic disease: Secondary | ICD-10-CM | POA: Diagnosis not present

## 2022-12-11 DIAGNOSIS — I10 Essential (primary) hypertension: Secondary | ICD-10-CM | POA: Diagnosis not present

## 2022-12-11 DIAGNOSIS — J45909 Unspecified asthma, uncomplicated: Secondary | ICD-10-CM | POA: Diagnosis not present

## 2022-12-11 DIAGNOSIS — T8619 Other complication of kidney transplant: Secondary | ICD-10-CM | POA: Diagnosis not present

## 2022-12-11 DIAGNOSIS — G4733 Obstructive sleep apnea (adult) (pediatric): Secondary | ICD-10-CM | POA: Diagnosis not present

## 2022-12-11 DIAGNOSIS — E1065 Type 1 diabetes mellitus with hyperglycemia: Secondary | ICD-10-CM | POA: Diagnosis not present

## 2022-12-11 DIAGNOSIS — Z01818 Encounter for other preprocedural examination: Secondary | ICD-10-CM | POA: Diagnosis not present

## 2022-12-11 DIAGNOSIS — N186 End stage renal disease: Secondary | ICD-10-CM | POA: Diagnosis not present

## 2022-12-11 DIAGNOSIS — G894 Chronic pain syndrome: Secondary | ICD-10-CM | POA: Diagnosis not present

## 2022-12-12 DIAGNOSIS — Z992 Dependence on renal dialysis: Secondary | ICD-10-CM | POA: Diagnosis not present

## 2022-12-12 DIAGNOSIS — N186 End stage renal disease: Secondary | ICD-10-CM | POA: Diagnosis not present

## 2022-12-12 DIAGNOSIS — G894 Chronic pain syndrome: Secondary | ICD-10-CM | POA: Diagnosis not present

## 2022-12-12 DIAGNOSIS — E1065 Type 1 diabetes mellitus with hyperglycemia: Secondary | ICD-10-CM | POA: Diagnosis not present

## 2022-12-12 DIAGNOSIS — Z940 Kidney transplant status: Secondary | ICD-10-CM | POA: Diagnosis not present

## 2022-12-12 DIAGNOSIS — E878 Other disorders of electrolyte and fluid balance, not elsewhere classified: Secondary | ICD-10-CM | POA: Diagnosis not present

## 2022-12-12 DIAGNOSIS — E10610 Type 1 diabetes mellitus with diabetic neuropathic arthropathy: Secondary | ICD-10-CM | POA: Diagnosis not present

## 2022-12-12 DIAGNOSIS — Z01818 Encounter for other preprocedural examination: Secondary | ICD-10-CM | POA: Diagnosis not present

## 2022-12-12 DIAGNOSIS — Z8639 Personal history of other endocrine, nutritional and metabolic disease: Secondary | ICD-10-CM | POA: Diagnosis not present

## 2022-12-12 DIAGNOSIS — J45909 Unspecified asthma, uncomplicated: Secondary | ICD-10-CM | POA: Diagnosis not present

## 2022-12-12 DIAGNOSIS — N179 Acute kidney failure, unspecified: Secondary | ICD-10-CM | POA: Diagnosis not present

## 2022-12-12 DIAGNOSIS — G4733 Obstructive sleep apnea (adult) (pediatric): Secondary | ICD-10-CM | POA: Diagnosis not present

## 2022-12-12 DIAGNOSIS — D84821 Immunodeficiency due to drugs: Secondary | ICD-10-CM | POA: Diagnosis not present

## 2022-12-12 DIAGNOSIS — E785 Hyperlipidemia, unspecified: Secondary | ICD-10-CM | POA: Diagnosis not present

## 2022-12-12 DIAGNOSIS — I10 Essential (primary) hypertension: Secondary | ICD-10-CM | POA: Diagnosis not present

## 2022-12-12 DIAGNOSIS — T8619 Other complication of kidney transplant: Secondary | ICD-10-CM | POA: Diagnosis not present

## 2022-12-12 DIAGNOSIS — Z79899 Other long term (current) drug therapy: Secondary | ICD-10-CM | POA: Diagnosis not present

## 2022-12-12 DIAGNOSIS — E1022 Type 1 diabetes mellitus with diabetic chronic kidney disease: Secondary | ICD-10-CM | POA: Diagnosis not present

## 2022-12-14 DIAGNOSIS — Z01818 Encounter for other preprocedural examination: Secondary | ICD-10-CM | POA: Diagnosis not present

## 2022-12-14 DIAGNOSIS — N179 Acute kidney failure, unspecified: Secondary | ICD-10-CM | POA: Diagnosis not present

## 2022-12-14 DIAGNOSIS — I10 Essential (primary) hypertension: Secondary | ICD-10-CM | POA: Diagnosis not present

## 2022-12-14 DIAGNOSIS — Z940 Kidney transplant status: Secondary | ICD-10-CM | POA: Diagnosis not present

## 2022-12-14 DIAGNOSIS — T8619 Other complication of kidney transplant: Secondary | ICD-10-CM | POA: Diagnosis not present

## 2022-12-15 DIAGNOSIS — Z8639 Personal history of other endocrine, nutritional and metabolic disease: Secondary | ICD-10-CM | POA: Diagnosis not present

## 2022-12-15 DIAGNOSIS — Z940 Kidney transplant status: Secondary | ICD-10-CM | POA: Diagnosis not present

## 2022-12-15 DIAGNOSIS — Z79899 Other long term (current) drug therapy: Secondary | ICD-10-CM | POA: Diagnosis not present

## 2022-12-16 ENCOUNTER — Other Ambulatory Visit: Payer: Self-pay | Admitting: Family Medicine

## 2022-12-16 ENCOUNTER — Ambulatory Visit: Payer: 59 | Admitting: Nurse Practitioner

## 2022-12-16 DIAGNOSIS — E782 Mixed hyperlipidemia: Secondary | ICD-10-CM

## 2022-12-17 DIAGNOSIS — N170 Acute kidney failure with tubular necrosis: Secondary | ICD-10-CM | POA: Diagnosis not present

## 2022-12-17 DIAGNOSIS — I152 Hypertension secondary to endocrine disorders: Secondary | ICD-10-CM | POA: Diagnosis not present

## 2022-12-17 DIAGNOSIS — D529 Folate deficiency anemia, unspecified: Secondary | ICD-10-CM | POA: Diagnosis not present

## 2022-12-17 DIAGNOSIS — E785 Hyperlipidemia, unspecified: Secondary | ICD-10-CM | POA: Diagnosis not present

## 2022-12-17 DIAGNOSIS — E1165 Type 2 diabetes mellitus with hyperglycemia: Secondary | ICD-10-CM | POA: Diagnosis not present

## 2022-12-17 DIAGNOSIS — E1065 Type 1 diabetes mellitus with hyperglycemia: Secondary | ICD-10-CM | POA: Diagnosis not present

## 2022-12-17 DIAGNOSIS — I10 Essential (primary) hypertension: Secondary | ICD-10-CM | POA: Diagnosis not present

## 2022-12-17 DIAGNOSIS — Z5181 Encounter for therapeutic drug level monitoring: Secondary | ICD-10-CM | POA: Diagnosis not present

## 2022-12-17 DIAGNOSIS — E1022 Type 1 diabetes mellitus with diabetic chronic kidney disease: Secondary | ICD-10-CM | POA: Diagnosis not present

## 2022-12-17 DIAGNOSIS — E8770 Fluid overload, unspecified: Secondary | ICD-10-CM | POA: Diagnosis not present

## 2022-12-17 DIAGNOSIS — E1122 Type 2 diabetes mellitus with diabetic chronic kidney disease: Secondary | ICD-10-CM | POA: Diagnosis not present

## 2022-12-17 DIAGNOSIS — D631 Anemia in chronic kidney disease: Secondary | ICD-10-CM | POA: Diagnosis not present

## 2022-12-17 DIAGNOSIS — D84821 Immunodeficiency due to drugs: Secondary | ICD-10-CM | POA: Diagnosis not present

## 2022-12-17 DIAGNOSIS — N186 End stage renal disease: Secondary | ICD-10-CM | POA: Diagnosis not present

## 2022-12-17 DIAGNOSIS — Z794 Long term (current) use of insulin: Secondary | ICD-10-CM | POA: Diagnosis not present

## 2022-12-17 DIAGNOSIS — Z940 Kidney transplant status: Secondary | ICD-10-CM | POA: Diagnosis not present

## 2022-12-17 DIAGNOSIS — K5900 Constipation, unspecified: Secondary | ICD-10-CM | POA: Diagnosis not present

## 2022-12-17 DIAGNOSIS — Z79891 Long term (current) use of opiate analgesic: Secondary | ICD-10-CM | POA: Diagnosis not present

## 2022-12-17 DIAGNOSIS — Z79621 Long term (current) use of calcineurin inhibitor: Secondary | ICD-10-CM | POA: Diagnosis not present

## 2022-12-17 DIAGNOSIS — R188 Other ascites: Secondary | ICD-10-CM | POA: Diagnosis not present

## 2022-12-17 DIAGNOSIS — Z7982 Long term (current) use of aspirin: Secondary | ICD-10-CM | POA: Diagnosis not present

## 2022-12-17 DIAGNOSIS — T8619 Other complication of kidney transplant: Secondary | ICD-10-CM | POA: Diagnosis not present

## 2022-12-17 DIAGNOSIS — Z7960 Long term (current) use of unspecified immunomodulators and immunosuppressants: Secondary | ICD-10-CM | POA: Diagnosis not present

## 2022-12-17 DIAGNOSIS — Z4822 Encounter for aftercare following kidney transplant: Secondary | ICD-10-CM | POA: Diagnosis not present

## 2022-12-17 DIAGNOSIS — Z992 Dependence on renal dialysis: Secondary | ICD-10-CM | POA: Diagnosis not present

## 2022-12-17 DIAGNOSIS — E871 Hypo-osmolality and hyponatremia: Secondary | ICD-10-CM | POA: Diagnosis not present

## 2022-12-23 ENCOUNTER — Telehealth: Payer: Self-pay

## 2022-12-23 NOTE — Transitions of Care (Post Inpatient/ED Visit) (Signed)
12/23/2022  Name: Sally Porter MRN: [id number] DOB: 04-02-1980  Today's TOC FU Call Status: Today's TOC FU Call Status:: Unsuccessful Call (1st Attempt) Unsuccessful Call (1st Attempt) Date: 12/23/22  Attempted to reach the patient regarding the most recent Inpatient/ED visit.  Follow Up Plan: Additional outreach attempts will be made to reach the patient to complete the Transitions of Care (Post Inpatient/ED visit) call.   Deidre Ala, RN Medical illustrator [facility] [phone]

## 2022-12-24 ENCOUNTER — Telehealth: Payer: Self-pay

## 2022-12-24 DIAGNOSIS — D509 Iron deficiency anemia, unspecified: Secondary | ICD-10-CM | POA: Diagnosis not present

## 2022-12-24 DIAGNOSIS — D529 Folate deficiency anemia, unspecified: Secondary | ICD-10-CM | POA: Diagnosis not present

## 2022-12-24 DIAGNOSIS — E871 Hypo-osmolality and hyponatremia: Secondary | ICD-10-CM | POA: Diagnosis not present

## 2022-12-24 DIAGNOSIS — E1065 Type 1 diabetes mellitus with hyperglycemia: Secondary | ICD-10-CM | POA: Diagnosis not present

## 2022-12-24 DIAGNOSIS — N186 End stage renal disease: Secondary | ICD-10-CM | POA: Diagnosis not present

## 2022-12-24 DIAGNOSIS — D84821 Immunodeficiency due to drugs: Secondary | ICD-10-CM | POA: Diagnosis not present

## 2022-12-24 DIAGNOSIS — Z5181 Encounter for therapeutic drug level monitoring: Secondary | ICD-10-CM | POA: Diagnosis not present

## 2022-12-24 DIAGNOSIS — I10 Essential (primary) hypertension: Secondary | ICD-10-CM | POA: Diagnosis not present

## 2022-12-24 DIAGNOSIS — R600 Localized edema: Secondary | ICD-10-CM | POA: Diagnosis not present

## 2022-12-24 DIAGNOSIS — R188 Other ascites: Secondary | ICD-10-CM | POA: Diagnosis not present

## 2022-12-24 DIAGNOSIS — Z4822 Encounter for aftercare following kidney transplant: Secondary | ICD-10-CM | POA: Diagnosis not present

## 2022-12-24 DIAGNOSIS — E785 Hyperlipidemia, unspecified: Secondary | ICD-10-CM | POA: Diagnosis not present

## 2022-12-24 DIAGNOSIS — I120 Hypertensive chronic kidney disease with stage 5 chronic kidney disease or end stage renal disease: Secondary | ICD-10-CM | POA: Diagnosis not present

## 2022-12-24 DIAGNOSIS — Z79621 Long term (current) use of calcineurin inhibitor: Secondary | ICD-10-CM | POA: Diagnosis not present

## 2022-12-24 DIAGNOSIS — Z79899 Other long term (current) drug therapy: Secondary | ICD-10-CM | POA: Diagnosis not present

## 2022-12-24 DIAGNOSIS — Z940 Kidney transplant status: Secondary | ICD-10-CM | POA: Diagnosis not present

## 2022-12-24 DIAGNOSIS — D649 Anemia, unspecified: Secondary | ICD-10-CM | POA: Diagnosis not present

## 2022-12-24 DIAGNOSIS — R609 Edema, unspecified: Secondary | ICD-10-CM | POA: Diagnosis not present

## 2022-12-24 NOTE — Transitions of Care (Post Inpatient/ED Visit) (Signed)
12/24/2022  Name: Sally Porter MRN: [id number] DOB: 1980-11-29  Today's TOC FU Call Status: Today's TOC FU Call Status:: Successful TOC FU Call Completed TOC FU Call Complete Date: 12/24/22 Patient's Name and Date of Birth confirmed.  Transition Care Management Follow-up Telephone Call Date of Discharge: 12/22/22 Discharge Facility: Other ([facility] Facility) Name of Other ([facility]) Discharge Facility: [facility] Type of Discharge: Inpatient Admission Primary Inpatient Discharge Diagnosis:: Post Kidney Transplant How have you been since you were released from the hospital?: Same Any questions or concerns?: No  Items Reviewed: Did you receive and understand the discharge instructions provided?: Yes Medications obtained,verified, and reconciled?: Yes (Medications Reviewed) Any new allergies since your discharge?: No Dietary orders reviewed?: NA Do you have support at home?: Yes People in Home: parent(s) Name of Support/Comfort Primary Source: Lajuana Matte  Medications Reviewed Today: Medications Reviewed Today     Reviewed by Redge Gainer, RN (Case Manager) on 12/24/22 at 1124  Med List Status: <None>   Medication Order Taking? Sig Documenting Provider Last Dose Status Informant  albuterol (VENTOLIN HFA) 108 (90 Base) MCG/ACT inhaler [id number] No Inhale 2 puffs into the lungs every 6 (six) hours as needed for wheezing or shortness of breath. Daryll Drown, NP Taking Active Self  atorvastatin (LIPITOR) 20 MG tablet [id number]  TAKE 1 TABLET BY MOUTH DAILY AT Ellis Savage, FNP  Active   calcitRIOL (ROCALTROL) 0.5 MCG capsule [id number] No TAKE ONE CAPSULE BY MOUTH DAILY Rakes, Doralee Albino, FNP Taking Active   calcium acetate (PHOSLO) 667 MG capsule [id number] No TAKE TWO CAPSULES BY MOUTH THREE TIMES DAILY WITH meals  Patient taking differently: Take 1,334 mg by mouth 3 (three) times daily with meals. 2 with breakfast and lunch, then take 3  with dinner   Rakes, Doralee Albino, FNP Taking Active Self  Continuous Blood Gluc Sensor (DEXCOM G7 SENSOR) MISC [id number] No Apply to arm every 10 days to test blood sugar continuously. DX: E11.65 Daryll Drown, NP Taking Active Self  ferrous sulfate 325 (65 FE) MG tablet [id number] No Take 325 mg by mouth daily with breakfast. [provider] Taking Active Self  fluticasone (FLONASE) 50 MCG/ACT nasal spray [id number] No Place 2 sprays into both nostrils daily. Daphine Deutscher, Mary-Margaret, FNP Taking Active Self  hydrALAZINE (APRESOLINE) 50 MG tablet [id number]  TAKE 1 TABLET BY MOUTH EVERY 8 HOURS Rakes, Doralee Albino, FNP  Active   insulin lispro (HUMALOG KWIKPEN) 100 UNIT/ML KwikPen [id number] No Inject 0-15 Units into the skin 3 (three) times daily. Sliding scale Daryll Drown, NP Taking Active Self           Med Note Donn Pierini Mar 04, 2022 10:12 AM) 8-18units meal dependent  Insulin Pen Needle 32G X 4 MM MISC [id number] No Use with insulin TID Dx E11.65 Daryll Drown, NP Taking Active Self  LANTUS SOLOSTAR 100 UNIT/ML Solostar Pen [id number]  INJECT 30 units into THE SKIN AT BEDTIME Sonny Masters, FNP  Active   LASIX 40 MG tablet [id number] No Take 40 mg by mouth daily. [provider] Taking Active Self  lidocaine (XYLOCAINE) 5 % ointment [id number] No Apply 1 Application topically 2 (two) times daily as needed for moderate pain. [provider] Taking Active Self  metoprolol tartrate (LOPRESSOR) 25 MG tablet [id number] No Take 25 mg by mouth 2 (two) times daily. [provider] Taking Active Self  naloxone [facility] nasal spray 4 mg/0.1 mL [id number] No  Place 0.4 mg into the nose once. [provider] Taking Active Self  nortriptyline (PAMELOR) 25 MG capsule [id number]  TAKE ONE CAPSULE BY MOUTH TWICE DAILY Rakes, Doralee Albino, FNP  Active   Oxycodone HCl 10 MG TABS [id number] No Take 10 mg by mouth every 6 (six) hours. [provider] Taking Active Self   polyethylene glycol (MIRALAX / GLYCOLAX) 17 g packet [id number] No Take 17 g by mouth daily.  Patient taking differently: Take 17 g by mouth daily as needed for moderate constipation or mild constipation.   Burnadette Pop, MD Taking Active Self  rOPINIRole (REQUIP) 0.5 MG tablet [id number]  Take 2 tablets (1 mg total) by mouth at bedtime. Sonny Masters, FNP  Active   senna-docusate (SENOKOT-S) 8.6-50 MG tablet [id number] No Take 1 tablet by mouth 2 (two) times daily.  Patient taking differently: Take 1 tablet by mouth daily as needed for moderate constipation or mild constipation.   Burnadette Pop, MD Taking Active Self  zinc gluconate 50 MG tablet [id number] No Take 50 mg by mouth daily. [provider] Taking Active Self  Med List Note Dianah Field 10/29/21 1053): Dialysis M-W-F            Home Care and Equipment/Supplies: Were Home Health Services Ordered?: NA Any new equipment or medical supplies ordered?: NA  Functional Questionnaire: Do you need assistance with bathing/showering or dressing?: No Do you need assistance with meal preparation?: No Do you need assistance with eating?: No Do you have difficulty maintaining continence: No Do you need assistance with getting out of bed/getting out of a chair/moving?: No Do you have difficulty managing or taking your medications?: No  Follow up appointments reviewed: PCP Follow-up appointment confirmed?: Yes Date of PCP follow-up appointment?: 01/04/23 Follow-up Provider: Luisa Hart Follow-up appointment confirmed?: Yes Date of Specialist follow-up appointment?: 12/24/22 Follow-Up Specialty Provider:: Kidney Transplant Team Do you need transportation to your follow-up appointment?: No Do you understand care options if your condition(s) worsen?: Yes-patient verbalized understanding  SDOH Interventions Today    Flowsheet Row Most Recent Value  SDOH Interventions   Food Insecurity  Interventions Intervention Not Indicated  Transportation Interventions Intervention Not Indicated      RNCM was able to make contact with the patient but she was short on the phone and did not want to talk much. She continues with her follow up with the Kidney transplant team through [facility]. She is unclear if she can make the appointment on 01/04/23 but states she will call and reschedule.  No further phone calls will be needed  Deidre Ala, RN RN Care Manager [facility] [phone]

## 2022-12-27 ENCOUNTER — Other Ambulatory Visit: Payer: Self-pay | Admitting: Family Medicine

## 2022-12-27 DIAGNOSIS — E1021 Type 1 diabetes mellitus with diabetic nephropathy: Secondary | ICD-10-CM | POA: Diagnosis not present

## 2022-12-27 DIAGNOSIS — M542 Cervicalgia: Secondary | ICD-10-CM | POA: Diagnosis not present

## 2022-12-27 DIAGNOSIS — E10649 Type 1 diabetes mellitus with hypoglycemia without coma: Secondary | ICD-10-CM | POA: Diagnosis not present

## 2022-12-27 DIAGNOSIS — R609 Edema, unspecified: Secondary | ICD-10-CM | POA: Diagnosis not present

## 2022-12-27 DIAGNOSIS — D509 Iron deficiency anemia, unspecified: Secondary | ICD-10-CM | POA: Diagnosis not present

## 2022-12-27 DIAGNOSIS — M25512 Pain in left shoulder: Secondary | ICD-10-CM | POA: Diagnosis not present

## 2022-12-27 DIAGNOSIS — M25511 Pain in right shoulder: Secondary | ICD-10-CM | POA: Diagnosis not present

## 2022-12-27 DIAGNOSIS — Z940 Kidney transplant status: Secondary | ICD-10-CM | POA: Diagnosis not present

## 2022-12-27 DIAGNOSIS — Z4822 Encounter for aftercare following kidney transplant: Secondary | ICD-10-CM | POA: Diagnosis not present

## 2022-12-27 DIAGNOSIS — G894 Chronic pain syndrome: Secondary | ICD-10-CM | POA: Diagnosis not present

## 2022-12-27 DIAGNOSIS — I517 Cardiomegaly: Secondary | ICD-10-CM | POA: Diagnosis not present

## 2022-12-27 DIAGNOSIS — G2581 Restless legs syndrome: Secondary | ICD-10-CM

## 2022-12-27 DIAGNOSIS — Z79899 Other long term (current) drug therapy: Secondary | ICD-10-CM | POA: Diagnosis not present

## 2022-12-27 DIAGNOSIS — F5101 Primary insomnia: Secondary | ICD-10-CM

## 2022-12-27 DIAGNOSIS — M25571 Pain in right ankle and joints of right foot: Secondary | ICD-10-CM | POA: Diagnosis not present

## 2022-12-27 DIAGNOSIS — I361 Nonrheumatic tricuspid (valve) insufficiency: Secondary | ICD-10-CM | POA: Diagnosis not present

## 2022-12-27 DIAGNOSIS — I10 Essential (primary) hypertension: Secondary | ICD-10-CM | POA: Diagnosis not present

## 2022-12-29 ENCOUNTER — Encounter: Payer: 59 | Admitting: Family Medicine

## 2022-12-29 ENCOUNTER — Other Ambulatory Visit: Payer: Self-pay | Admitting: Family Medicine

## 2022-12-30 DIAGNOSIS — D84821 Immunodeficiency due to drugs: Secondary | ICD-10-CM | POA: Diagnosis not present

## 2022-12-30 DIAGNOSIS — R935 Abnormal findings on diagnostic imaging of other abdominal regions, including retroperitoneum: Secondary | ICD-10-CM | POA: Diagnosis not present

## 2022-12-30 DIAGNOSIS — Z79899 Other long term (current) drug therapy: Secondary | ICD-10-CM | POA: Diagnosis not present

## 2022-12-30 DIAGNOSIS — T888XXA Other specified complications of surgical and medical care, not elsewhere classified, initial encounter: Secondary | ICD-10-CM | POA: Diagnosis not present

## 2022-12-30 DIAGNOSIS — E1065 Type 1 diabetes mellitus with hyperglycemia: Secondary | ICD-10-CM | POA: Diagnosis not present

## 2022-12-30 DIAGNOSIS — Z4822 Encounter for aftercare following kidney transplant: Secondary | ICD-10-CM | POA: Diagnosis not present

## 2022-12-30 DIAGNOSIS — I10 Essential (primary) hypertension: Secondary | ICD-10-CM | POA: Diagnosis not present

## 2022-12-30 DIAGNOSIS — Z794 Long term (current) use of insulin: Secondary | ICD-10-CM | POA: Diagnosis not present

## 2022-12-30 DIAGNOSIS — Z940 Kidney transplant status: Secondary | ICD-10-CM | POA: Diagnosis not present

## 2022-12-30 DIAGNOSIS — Z79621 Long term (current) use of calcineurin inhibitor: Secondary | ICD-10-CM | POA: Diagnosis not present

## 2022-12-30 DIAGNOSIS — B259 Cytomegaloviral disease, unspecified: Secondary | ICD-10-CM | POA: Diagnosis not present

## 2022-12-30 DIAGNOSIS — E871 Hypo-osmolality and hyponatremia: Secondary | ICD-10-CM | POA: Diagnosis not present

## 2022-12-30 DIAGNOSIS — Z7952 Long term (current) use of systemic steroids: Secondary | ICD-10-CM | POA: Diagnosis not present

## 2022-12-30 DIAGNOSIS — R188 Other ascites: Secondary | ICD-10-CM | POA: Diagnosis not present

## 2022-12-30 DIAGNOSIS — Z79624 Long term (current) use of inhibitors of nucleotide synthesis: Secondary | ICD-10-CM | POA: Diagnosis not present

## 2022-12-30 DIAGNOSIS — E8770 Fluid overload, unspecified: Secondary | ICD-10-CM | POA: Diagnosis not present

## 2022-12-30 DIAGNOSIS — Z5181 Encounter for therapeutic drug level monitoring: Secondary | ICD-10-CM | POA: Diagnosis not present

## 2022-12-30 DIAGNOSIS — E785 Hyperlipidemia, unspecified: Secondary | ICD-10-CM | POA: Diagnosis not present

## 2022-12-30 DIAGNOSIS — D529 Folate deficiency anemia, unspecified: Secondary | ICD-10-CM | POA: Diagnosis not present

## 2022-12-30 DIAGNOSIS — I340 Nonrheumatic mitral (valve) insufficiency: Secondary | ICD-10-CM | POA: Diagnosis not present

## 2023-01-04 ENCOUNTER — Encounter: Payer: 59 | Admitting: Family Medicine

## 2023-01-04 DIAGNOSIS — Z7952 Long term (current) use of systemic steroids: Secondary | ICD-10-CM | POA: Diagnosis not present

## 2023-01-04 DIAGNOSIS — R188 Other ascites: Secondary | ICD-10-CM | POA: Diagnosis not present

## 2023-01-04 DIAGNOSIS — Z794 Long term (current) use of insulin: Secondary | ICD-10-CM | POA: Diagnosis not present

## 2023-01-04 DIAGNOSIS — I071 Rheumatic tricuspid insufficiency: Secondary | ICD-10-CM | POA: Diagnosis not present

## 2023-01-04 DIAGNOSIS — E871 Hypo-osmolality and hyponatremia: Secondary | ICD-10-CM | POA: Diagnosis not present

## 2023-01-04 DIAGNOSIS — Z940 Kidney transplant status: Secondary | ICD-10-CM | POA: Diagnosis not present

## 2023-01-04 DIAGNOSIS — T8619 Other complication of kidney transplant: Secondary | ICD-10-CM | POA: Diagnosis not present

## 2023-01-04 DIAGNOSIS — D849 Immunodeficiency, unspecified: Secondary | ICD-10-CM | POA: Diagnosis not present

## 2023-01-04 DIAGNOSIS — Z4822 Encounter for aftercare following kidney transplant: Secondary | ICD-10-CM | POA: Diagnosis not present

## 2023-01-04 DIAGNOSIS — N2889 Other specified disorders of kidney and ureter: Secondary | ICD-10-CM | POA: Diagnosis not present

## 2023-01-04 DIAGNOSIS — Z79621 Long term (current) use of calcineurin inhibitor: Secondary | ICD-10-CM | POA: Diagnosis not present

## 2023-01-04 DIAGNOSIS — E1065 Type 1 diabetes mellitus with hyperglycemia: Secondary | ICD-10-CM | POA: Diagnosis not present

## 2023-01-04 DIAGNOSIS — Z5181 Encounter for therapeutic drug level monitoring: Secondary | ICD-10-CM | POA: Diagnosis not present

## 2023-01-04 DIAGNOSIS — I10 Essential (primary) hypertension: Secondary | ICD-10-CM | POA: Diagnosis not present

## 2023-01-04 DIAGNOSIS — E785 Hyperlipidemia, unspecified: Secondary | ICD-10-CM | POA: Diagnosis not present

## 2023-01-04 DIAGNOSIS — Z79899 Other long term (current) drug therapy: Secondary | ICD-10-CM | POA: Diagnosis not present

## 2023-01-06 ENCOUNTER — Ambulatory Visit: Payer: 59 | Admitting: Family Medicine

## 2023-01-07 DIAGNOSIS — Z4822 Encounter for aftercare following kidney transplant: Secondary | ICD-10-CM | POA: Diagnosis not present

## 2023-01-07 DIAGNOSIS — Z940 Kidney transplant status: Secondary | ICD-10-CM | POA: Diagnosis not present

## 2023-01-07 DIAGNOSIS — N2889 Other specified disorders of kidney and ureter: Secondary | ICD-10-CM | POA: Diagnosis not present

## 2023-01-07 DIAGNOSIS — Z79621 Long term (current) use of calcineurin inhibitor: Secondary | ICD-10-CM | POA: Diagnosis not present

## 2023-01-07 DIAGNOSIS — T8619 Other complication of kidney transplant: Secondary | ICD-10-CM | POA: Diagnosis not present

## 2023-01-07 DIAGNOSIS — E875 Hyperkalemia: Secondary | ICD-10-CM | POA: Diagnosis not present

## 2023-01-07 DIAGNOSIS — I071 Rheumatic tricuspid insufficiency: Secondary | ICD-10-CM | POA: Diagnosis not present

## 2023-01-07 DIAGNOSIS — Z5181 Encounter for therapeutic drug level monitoring: Secondary | ICD-10-CM | POA: Diagnosis not present

## 2023-01-11 ENCOUNTER — Ambulatory Visit: Payer: 59

## 2023-01-11 VITALS — Ht 68.0 in | Wt 222.0 lb

## 2023-01-11 DIAGNOSIS — D649 Anemia, unspecified: Secondary | ICD-10-CM | POA: Diagnosis not present

## 2023-01-11 DIAGNOSIS — Z940 Kidney transplant status: Secondary | ICD-10-CM | POA: Diagnosis not present

## 2023-01-11 DIAGNOSIS — Z79899 Other long term (current) drug therapy: Secondary | ICD-10-CM | POA: Diagnosis not present

## 2023-01-11 DIAGNOSIS — Z0000 Encounter for general adult medical examination without abnormal findings: Secondary | ICD-10-CM | POA: Diagnosis not present

## 2023-01-11 NOTE — Progress Notes (Signed)
Subjective:   Sally Porter is a 42 y.o. female who presents for Medicare Annual (Subsequent) preventive examination.  Visit Complete: Virtual I connected with  Sally Porter on 01/11/23 by a audio enabled telemedicine application and verified that I am speaking with the correct person using two identifiers.  Patient Location: Home  Provider Location: Home Office  I discussed the limitations of evaluation and management by telemedicine. The patient expressed understanding and agreed to proceed.  Vital Signs: Because this visit was a virtual/telehealth visit, some criteria may be missing or patient reported. Any vitals not documented were not able to be obtained and vitals that have been documented are patient reported.  Patient Medicare AWV questionnaire was completed by the patient on 01/11/2023; I have confirmed that all information answered by patient is correct and no changes since this date.  Cardiac Risk Factors include: diabetes mellitus;dyslipidemia;hypertension     Objective:    Today's Vitals   01/11/23 1536  Weight: 222 lb (100.7 kg)  Height: 5\' 8"  (1.727 m)   Body mass index is 33.75 kg/m.     01/11/2023    3:49 PM 06/12/2022    5:20 PM 06/12/2022    5:17 PM 01/06/2022    8:21 AM 11/18/2021   11:17 AM 08/14/2021   11:47 AM 12/24/2014    9:05 AM  Advanced Directives  Does Patient Have a Medical Advance Directive? No No No No No No No  Copy of Healthcare Power of Attorney in Chart?     No - copy requested    Would patient like information on creating a medical advance directive? Yes (MAU/Ambulatory/Procedural Areas - Information given) No - Patient declined  No - Patient declined No - Patient declined No - Patient declined     Current Medications (verified) Outpatient Encounter Medications as of 01/11/2023  Medication Sig   acetaminophen (TYLENOL) 325 MG tablet Take 650 mg by mouth every 6 (six) hours as needed.   albuterol (VENTOLIN HFA) 108 (90 Base)  MCG/ACT inhaler Inhale 2 puffs into the lungs every 6 (six) hours as needed for wheezing or shortness of breath.   atorvastatin (LIPITOR) 20 MG tablet TAKE 1 TABLET BY MOUTH DAILY AT 6PM   calcitRIOL (ROCALTROL) 0.5 MCG capsule TAKE ONE CAPSULE BY MOUTH DAILY   calcium acetate (PHOSLO) 667 MG capsule TAKE TWO CAPSULES BY MOUTH THREE TIMES DAILY WITH meals (Patient taking differently: Take 1,334 mg by mouth 3 (three) times daily with meals. 2 with breakfast and lunch, then take 3 with dinner)   Continuous Blood Gluc Sensor (DEXCOM G7 SENSOR) MISC Apply to arm every 10 days to test blood sugar continuously. DX: E11.65   famotidine (PEPCID) 20 MG tablet Take by mouth.   ferrous sulfate 325 (65 FE) MG tablet Take 325 mg by mouth daily with breakfast.   ferrous sulfate ER (SLOW FE) 142 (45 Fe) MG TBCR tablet Take by mouth.   fluconazole (DIFLUCAN) 50 MG tablet Take 1 tablet by mouth daily.   fluticasone (FLONASE) 50 MCG/ACT nasal spray Place 2 sprays into both nostrils daily.   folic acid (FOLVITE) 1 MG tablet Take 1 tablet by mouth daily.   hydrALAZINE (APRESOLINE) 50 MG tablet TAKE 1 TABLET BY MOUTH EVERY 8 HOURS   insulin lispro (HUMALOG KWIKPEN) 100 UNIT/ML KwikPen Inject 0-15 Units into the skin 3 (three) times daily. Sliding scale   Insulin Pen Needle 32G X 4 MM MISC Use with insulin TID Dx E11.65   LANTUS SOLOSTAR 100 UNIT/ML Solostar  Pen INJECT 30 units into THE SKIN AT BEDTIME   LASIX 40 MG tablet Take 40 mg by mouth daily.   lidocaine (XYLOCAINE) 5 % ointment Apply 1 Application topically 2 (two) times daily as needed for moderate pain.   methocarbamol (ROBAXIN) 500 MG tablet Take by mouth.   metoprolol tartrate (LOPRESSOR) 25 MG tablet TAKE 1 TABLET BY MOUTH TWICE DAILY   mycophenolate (MYFORTIC) 180 MG EC tablet Take by mouth.   naloxone (NARCAN) nasal spray 4 mg/0.1 mL Place 0.4 mg into the nose once.   nortriptyline (PAMELOR) 25 MG capsule TAKE ONE CAPSULE BY MOUTH TWICE DAILY    Oxycodone HCl 10 MG TABS Take 10 mg by mouth every 6 (six) hours.   polyethylene glycol (MIRALAX / GLYCOLAX) 17 g packet Take 17 g by mouth daily. (Patient taking differently: Take 17 g by mouth daily as needed for moderate constipation or mild constipation.)   rOPINIRole (REQUIP) 0.5 MG tablet TAKE 2 TABLETS BY MOUTH AT BEDTIME   rOPINIRole (REQUIP) 0.5 MG tablet Take by mouth.   senna-docusate (SENOKOT-S) 8.6-50 MG tablet Take 1 tablet by mouth 2 (two) times daily. (Patient taking differently: Take 1 tablet by mouth daily as needed for moderate constipation or mild constipation.)   senna-docusate (SENOKOT-S) 8.6-50 MG tablet Take by mouth.   sulfamethoxazole-trimethoprim (BACTRIM) 400-80 MG tablet Take by mouth.   tacrolimus (PROGRAF) 1 MG capsule Take by mouth.   valGANciclovir (VALCYTE) 450 MG tablet Take by mouth.   zinc gluconate 50 MG tablet Take 50 mg by mouth daily.   predniSONE (DELTASONE) 5 MG tablet Take 5 mg by mouth daily.   No facility-administered encounter medications on file as of 01/11/2023.    Allergies (verified) Amoxicillin, Tylenol [acetaminophen], Dilaudid [hydromorphone], Morphine and codeine, and Pregabalin   History: Past Medical History:  Diagnosis Date   Anemia    Anxiety    Depression    Diabetes mellitus without complication (HCC)    Reported history of DM type 1   ESRD (end stage renal disease) (HCC)    short term hemodialysis 06/2017; resumed HD 08/15/21   Glaucoma    Hyperlipidemia    Hypertension    Past Surgical History:  Procedure Laterality Date   ANKLE SURGERY Right    AV FISTULA PLACEMENT Left 08/19/2021   Procedure: Creation of Brachial Cephalic LEFT ARM ARTERIOVENOUS (AV) FISTULA;  Surgeon: Leonie Douglas, MD;  Location: [facility] OR;  Service: Vascular;  Laterality: Left;  PERIPHERAL NERVE BLOCK   CESAREAN SECTION  2009   FISTULA SUPERFICIALIZATION Left 11/19/2021   Procedure: LEFT ARM ARTERIOVENOUS FISTULA SUPERFICIALIZATION AND SIDE BRANCH  LIGATION;  Surgeon: Leonie Douglas, MD;  Location: [facility] OR;  Service: Vascular;  Laterality: Left;  PERIPHERAL NERVE BLOCK   FOOT SURGERY Left    IR FLUORO GUIDE CV LINE RIGHT  08/15/2021   IR US GUIDE VASC ACCESS RIGHT  08/15/2021   REMOVAL OF A DIALYSIS CATHETER N/A 01/26/2022   Procedure: MINOR REMOVAL OF A DIALYSIS CATHETER;  Surgeon: Larina Earthly, MD;  Location: [facility];  Service: Vascular;  Laterality: N/A;   Family History  Problem Relation Age of Onset   Arthritis Father    Alcohol abuse Father    Kidney disease Brother    Diabetes Daughter        Type 2   Asthma Daughter    Social History   Socioeconomic History   Marital status: Significant Other    Spouse name: Molly Maduro   Number  of children: 1   Years of education: 13   Highest education level: Some college, no degree  Occupational History   Not on file  Tobacco Use   Smoking status: Former    Current packs/day: 0.00    Average packs/day: 0.5 packs/day for 3.0 years (1.5 ttl pk-yrs)    Types: Cigarettes    Start date: 2010    Quit date: 2013    Years since quitting: 11.8   Smokeless tobacco: Never  Vaping Use   Vaping status: Never Used  Substance and Sexual Activity   Alcohol use: Not Currently   Drug use: Never   Sexual activity: Not Currently    Birth control/protection: None  Other Topics Concern   Not on file  Social History Narrative   Not on file   Social Determinants of Health   Financial Resource Strain: Low Risk  (01/11/2023)   Overall Financial Resource Strain (CARDIA)    Difficulty of Paying Living Expenses: Not hard at all  Food Insecurity: No Food Insecurity (01/11/2023)   Hunger Vital Sign    Worried About Running Out of Food in the Last Year: Never true    Ran Out of Food in the Last Year: Never true  Transportation Needs: No Transportation Needs (01/11/2023)   PRAPARE - Administrator, Civil Service (Medical): No    Lack of Transportation (Non-Medical): No  Physical  Activity: Insufficiently Active (01/11/2023)   Exercise Vital Sign    Days of Exercise per Week: 3 days    Minutes of Exercise per Session: 30 min  Stress: No Stress Concern Present (01/11/2023)   [employer] of Occupational Health - Occupational Stress Questionnaire    Feeling of Stress : Not at all  Social Connections: Moderately Integrated (01/11/2023)   Social Connection and Isolation Panel [NHANES]    Frequency of Communication with Friends and Family: More than three times a week    Frequency of Social Gatherings with Friends and Family: More than three times a week    Attends Religious Services: 1 to 4 times per year    Active Member of [employer] or Organizations: No    Attends Engineer, structural: Never    Marital Status: Living with partner    Tobacco Counseling Counseling given: Not Answered   Clinical Intake:  Pre-visit preparation completed: Yes  Pain : No/denies pain     Nutritional Risks: None Diabetes: Yes CBG done?: No Did pt. bring in CBG monitor from home?: No  How often do you need to have someone help you when you read instructions, pamphlets, or other written materials from your doctor or pharmacy?: 1 - Never  Interpreter Needed?: No  Information entered by :: Renie Ora, LPN   Activities of Daily Living    01/11/2023    3:49 PM  In your present state of health, do you have any difficulty performing the following activities:  Hearing? 0  Vision? 0  Difficulty concentrating or making decisions? 0  Walking or climbing stairs? 0  Dressing or bathing? 0  Doing errands, shopping? 0  Preparing Food and eating ? N  Using the Toilet? N  In the past six months, have you accidently leaked urine? N  Do you have problems with loss of bowel control? N  Managing your Medications? N  Managing your Finances? N  Housekeeping or managing your Housekeeping? N    Patient Care Team: Sonny Masters, FNP as PCP - General (Family  Medicine) Vanice Sarah  D, RPH as Pharmacist (Family Medicine) [place], [facility]  Indicate any recent Medical Services you may have received from other than [facility] providers in the past year (date may be approximate).     Assessment:   This is a routine wellness examination for Sally Porter.  Hearing/Vision screen Vision Screening - Comments:: Wears rx glasses - up to date with routine eye exams with  Dr.Le    Goals Addressed             This Visit's Progress    DIET - EAT MORE FRUITS AND VEGETABLES         Depression Screen    01/11/2023    3:48 PM 10/05/2022    2:01 PM 06/29/2022    9:43 AM 02/09/2022   12:37 PM 01/06/2022    8:22 AM 10/13/2021    9:06 AM  PHQ 2/9 Scores  PHQ - 2 Score 0 0 4 0 0 0  PHQ- 9 Score  3 8 0      Fall Risk    01/11/2023    3:37 PM 10/05/2022    2:01 PM 06/29/2022    9:43 AM 02/09/2022   12:45 PM 01/06/2022    8:22 AM  Fall Risk   Falls in the past year? 0 0 0 0 0  Number falls in past yr: 0  0 0   Injury with Fall? 0  0 0   Risk for fall due to : No Fall Risks   No Fall Risks   Follow up Falls prevention discussed   Education provided     MEDICARE RISK AT HOME: Medicare Risk at Home Any stairs in or around the home?: No If so, are there any without handrails?: No Home free of loose throw rugs in walkways, pet beds, electrical cords, etc?: Yes Adequate lighting in your home to reduce risk of falls?: Yes Life alert?: No Use of a cane, walker or w/c?: No Grab bars in the bathroom?: Yes Shower chair or bench in shower?: Yes Elevated toilet seat or a handicapped toilet?: Yes  TIMED UP AND GO:  Was the test performed?  No    Cognitive Function:        01/11/2023    3:50 PM 01/06/2022    8:25 AM  6CIT Screen  What Year? 0 points 0 points  What month? 0 points 0 points  What time? 0 points 0 points  Count back from 20 0 points 0 points  Months in reverse 0 points 0 points  Repeat phrase 0 points 0 points   Total Score 0 points 0 points    Immunizations Immunization History  Administered Date(s) Administered   PPD Test 08/18/2021   Pneumococcal Polysaccharide-23 07/07/2017   Tdap 07/07/2017   Unspecified SARS-COV-2 Vaccination 12/24/2019, 01/14/2020    TDAP status: Up to date  Flu Vaccine status: Due, Education has been provided regarding the importance of this vaccine. Advised may receive this vaccine at local pharmacy or Health Dept. Aware to provide a copy of the vaccination record if obtained from local pharmacy or Health Dept. Verbalized acceptance and understanding.  Pneumococcal vaccine status: Up to date  Covid-19 vaccine status: Completed vaccines  Qualifies for Shingles Vaccine? No   Zostavax completed No   Shingrix Completed?: No.    Education has been provided regarding the importance of this vaccine. Patient has been advised to call insurance company to determine out of pocket expense if they have not yet received this vaccine. Advised  may also receive vaccine at local pharmacy or Health Dept. Verbalized acceptance and understanding.  Screening Tests Health Maintenance  Topic Date Due   Cervical Cancer Screening (HPV/Pap Cotest)  04/20/2011   HEMOGLOBIN A1C  02/13/2022   FOOT EXAM  07/15/2022   OPHTHALMOLOGY EXAM  07/15/2022   COVID-19 Vaccine (2 - Mixed Product risk series) 01/27/2023 (Originally 02/11/2020)   INFLUENZA VACCINE  05/30/2023 (Originally 09/30/2022)   Medicare Annual Wellness (AWV)  01/11/2024   Hepatitis C Screening  Completed   HIV Screening  Completed   HPV VACCINES  Aged Out   DTaP/Tdap/Td  Discontinued    Health Maintenance  Health Maintenance Due  Topic Date Due   Cervical Cancer Screening (HPV/Pap Cotest)  04/20/2011   HEMOGLOBIN A1C  02/13/2022   FOOT EXAM  07/15/2022   OPHTHALMOLOGY EXAM  07/15/2022    Colorectal cancer screening: No longer required.   Mammogram status: Completed 02/17/2022. Repeat every year  Bone Density  status: Ordered not of age . Pt provided with contact info and advised to call to schedule appt.  Lung Cancer Screening: (Low Dose CT Chest recommended if Age 61-80 years, 20 pack-year currently smoking OR have quit w/in 15years.) does not qualify.   Lung Cancer Screening Referral: n/a  Additional Screening:  Hepatitis C Screening: does not qualify; Completed 08/15/2021  Vision Screening: Recommended annual ophthalmology exams for early detection of glaucoma and other disorders of the eye. Is the patient up to date with their annual eye exam?  Yes  Who is the provider or what is the name of the office in which the patient attends annual eye exams? Dr.Le  If pt is not established with a provider, would they like to be referred to a provider to establish care? No .   Dental Screening: Recommended annual dental exams for proper oral hygiene  Diabetic Foot Exam: Diabetic Foot Exam: Overdue, Pt has been advised about the importance in completing this exam. Pt is scheduled for diabetic foot exam on next office visit .  Community Resource Referral / Chronic Care Management: CRR required this visit?  No   CCM required this visit?  No     Plan:     I have personally reviewed and noted the following in the patient's chart:   Medical and social history Use of alcohol, tobacco or illicit drugs  Current medications and supplements including opioid prescriptions. Patient is currently taking opioid prescriptions. Information provided to patient regarding non-opioid alternatives. Patient advised to discuss non-opioid treatment plan with their provider. Functional ability and status Nutritional status Physical activity Advanced directives List of other physicians Hospitalizations, surgeries, and ER visits in previous 12 months Vitals Screenings to include cognitive, depression, and falls Referrals and appointments  In addition, I have reviewed and discussed with patient certain preventive  protocols, quality metrics, and best practice recommendations. A written personalized care plan for preventive services as well as general preventive health recommendations were provided to patient.     Lorrene Reid, LPN   [id number]   After Visit Summary: (MyChart) Due to this being a telephonic visit, the after visit summary with patients personalized plan was offered to patient via MyChart   Nurse Notes: none

## 2023-01-11 NOTE — Patient Instructions (Signed)
Sally Porter , Thank you for taking time to come for your Medicare Wellness Visit. I appreciate your ongoing commitment to your health goals. Please review the following plan we discussed and let me know if I can assist you in the future.   Referrals/Orders/Follow-Ups/Clinician Recommendations: Aim for 30 minutes of exercise or brisk walking, 6-8 glasses of water, and 5 servings of fruits and vegetables each day.   This is a list of the screening recommended for you and due dates:  Health Maintenance  Topic Date Due   Pap with HPV screening  04/20/2011   COVID-19 Vaccine (2 - Mixed Product risk series) 02/11/2020   Hemoglobin A1C  02/13/2022   Complete foot exam   07/15/2022   Eye exam for diabetics  07/15/2022   Flu Shot  05/30/2023*   Medicare Annual Wellness Visit  01/11/2024   Hepatitis C Screening  Completed   HIV Screening  Completed   HPV Vaccine  Aged Out   DTaP/Tdap/Td vaccine  Discontinued  *Topic was postponed. The date shown is not the original due date.    Advanced directives: (Provided) Advance directive discussed with you today. I have provided a copy for you to complete at home and have notarized. Once this is complete, please bring a copy in to our office so we can scan it into your chart. Information on Advanced Care Planning can be found at [facility] Advance Health Care Directives Advance Health Care Directives ([email or web address]    Next Medicare Annual Wellness Visit scheduled for next year: Yes insert Preventive Care Attachment Reference

## 2023-01-12 DIAGNOSIS — T8619 Other complication of kidney transplant: Secondary | ICD-10-CM | POA: Diagnosis not present

## 2023-01-14 DIAGNOSIS — Z940 Kidney transplant status: Secondary | ICD-10-CM | POA: Diagnosis not present

## 2023-01-14 DIAGNOSIS — E1021 Type 1 diabetes mellitus with diabetic nephropathy: Secondary | ICD-10-CM | POA: Diagnosis not present

## 2023-01-14 DIAGNOSIS — Z5181 Encounter for therapeutic drug level monitoring: Secondary | ICD-10-CM | POA: Diagnosis not present

## 2023-01-14 DIAGNOSIS — Z4822 Encounter for aftercare following kidney transplant: Secondary | ICD-10-CM | POA: Diagnosis not present

## 2023-01-14 DIAGNOSIS — I340 Nonrheumatic mitral (valve) insufficiency: Secondary | ICD-10-CM | POA: Diagnosis not present

## 2023-01-14 DIAGNOSIS — E875 Hyperkalemia: Secondary | ICD-10-CM | POA: Diagnosis not present

## 2023-01-14 DIAGNOSIS — N2889 Other specified disorders of kidney and ureter: Secondary | ICD-10-CM | POA: Diagnosis not present

## 2023-01-14 DIAGNOSIS — E1065 Type 1 diabetes mellitus with hyperglycemia: Secondary | ICD-10-CM | POA: Diagnosis not present

## 2023-01-14 DIAGNOSIS — T888XXA Other specified complications of surgical and medical care, not elsewhere classified, initial encounter: Secondary | ICD-10-CM | POA: Diagnosis not present

## 2023-01-14 DIAGNOSIS — T8619 Other complication of kidney transplant: Secondary | ICD-10-CM | POA: Diagnosis not present

## 2023-01-14 DIAGNOSIS — D508 Other iron deficiency anemias: Secondary | ICD-10-CM | POA: Diagnosis not present

## 2023-01-14 DIAGNOSIS — Z79621 Long term (current) use of calcineurin inhibitor: Secondary | ICD-10-CM | POA: Diagnosis not present

## 2023-01-14 DIAGNOSIS — I071 Rheumatic tricuspid insufficiency: Secondary | ICD-10-CM | POA: Diagnosis not present

## 2023-01-16 ENCOUNTER — Other Ambulatory Visit: Payer: Self-pay | Admitting: Family Medicine

## 2023-01-17 DIAGNOSIS — M25512 Pain in left shoulder: Secondary | ICD-10-CM | POA: Diagnosis not present

## 2023-01-17 DIAGNOSIS — M542 Cervicalgia: Secondary | ICD-10-CM | POA: Diagnosis not present

## 2023-01-17 DIAGNOSIS — Z79891 Long term (current) use of opiate analgesic: Secondary | ICD-10-CM | POA: Diagnosis not present

## 2023-01-17 DIAGNOSIS — M25572 Pain in left ankle and joints of left foot: Secondary | ICD-10-CM | POA: Diagnosis not present

## 2023-01-17 DIAGNOSIS — M25571 Pain in right ankle and joints of right foot: Secondary | ICD-10-CM | POA: Diagnosis not present

## 2023-01-17 DIAGNOSIS — M5459 Other low back pain: Secondary | ICD-10-CM | POA: Diagnosis not present

## 2023-01-17 DIAGNOSIS — Z79899 Other long term (current) drug therapy: Secondary | ICD-10-CM | POA: Diagnosis not present

## 2023-01-17 DIAGNOSIS — G894 Chronic pain syndrome: Secondary | ICD-10-CM | POA: Diagnosis not present

## 2023-01-17 DIAGNOSIS — M25511 Pain in right shoulder: Secondary | ICD-10-CM | POA: Diagnosis not present

## 2023-01-21 DIAGNOSIS — Z4822 Encounter for aftercare following kidney transplant: Secondary | ICD-10-CM | POA: Diagnosis not present

## 2023-01-21 DIAGNOSIS — D849 Immunodeficiency, unspecified: Secondary | ICD-10-CM | POA: Diagnosis not present

## 2023-01-21 DIAGNOSIS — E1021 Type 1 diabetes mellitus with diabetic nephropathy: Secondary | ICD-10-CM | POA: Diagnosis not present

## 2023-01-21 DIAGNOSIS — I10 Essential (primary) hypertension: Secondary | ICD-10-CM | POA: Diagnosis not present

## 2023-01-21 DIAGNOSIS — Z48298 Encounter for aftercare following other organ transplant: Secondary | ICD-10-CM | POA: Diagnosis not present

## 2023-01-21 DIAGNOSIS — Z940 Kidney transplant status: Secondary | ICD-10-CM | POA: Diagnosis not present

## 2023-01-24 DIAGNOSIS — Z940 Kidney transplant status: Secondary | ICD-10-CM | POA: Diagnosis not present

## 2023-01-24 DIAGNOSIS — E875 Hyperkalemia: Secondary | ICD-10-CM | POA: Diagnosis not present

## 2023-01-24 DIAGNOSIS — Z4822 Encounter for aftercare following kidney transplant: Secondary | ICD-10-CM | POA: Diagnosis not present

## 2023-01-24 DIAGNOSIS — I361 Nonrheumatic tricuspid (valve) insufficiency: Secondary | ICD-10-CM | POA: Diagnosis not present

## 2023-01-24 DIAGNOSIS — N179 Acute kidney failure, unspecified: Secondary | ICD-10-CM | POA: Diagnosis not present

## 2023-01-24 DIAGNOSIS — I10 Essential (primary) hypertension: Secondary | ICD-10-CM | POA: Diagnosis not present

## 2023-01-25 DIAGNOSIS — E875 Hyperkalemia: Secondary | ICD-10-CM | POA: Diagnosis not present

## 2023-01-25 DIAGNOSIS — Z79899 Other long term (current) drug therapy: Secondary | ICD-10-CM | POA: Diagnosis not present

## 2023-01-25 DIAGNOSIS — Z5189 Encounter for other specified aftercare: Secondary | ICD-10-CM | POA: Diagnosis not present

## 2023-01-25 DIAGNOSIS — D509 Iron deficiency anemia, unspecified: Secondary | ICD-10-CM | POA: Diagnosis not present

## 2023-01-25 DIAGNOSIS — Z940 Kidney transplant status: Secondary | ICD-10-CM | POA: Diagnosis not present

## 2023-01-25 DIAGNOSIS — E538 Deficiency of other specified B group vitamins: Secondary | ICD-10-CM | POA: Diagnosis not present

## 2023-01-25 DIAGNOSIS — I3139 Other pericardial effusion (noninflammatory): Secondary | ICD-10-CM | POA: Diagnosis not present

## 2023-01-25 DIAGNOSIS — R188 Other ascites: Secondary | ICD-10-CM | POA: Diagnosis not present

## 2023-01-25 DIAGNOSIS — I071 Rheumatic tricuspid insufficiency: Secondary | ICD-10-CM | POA: Diagnosis not present

## 2023-01-25 DIAGNOSIS — R9431 Abnormal electrocardiogram [ECG] [EKG]: Secondary | ICD-10-CM | POA: Diagnosis not present

## 2023-01-25 DIAGNOSIS — I517 Cardiomegaly: Secondary | ICD-10-CM | POA: Diagnosis not present

## 2023-01-25 DIAGNOSIS — I361 Nonrheumatic tricuspid (valve) insufficiency: Secondary | ICD-10-CM | POA: Diagnosis not present

## 2023-01-25 DIAGNOSIS — D84821 Immunodeficiency due to drugs: Secondary | ICD-10-CM | POA: Diagnosis not present

## 2023-01-26 DIAGNOSIS — E875 Hyperkalemia: Secondary | ICD-10-CM | POA: Diagnosis not present

## 2023-01-27 DIAGNOSIS — M25512 Pain in left shoulder: Secondary | ICD-10-CM | POA: Diagnosis not present

## 2023-01-27 DIAGNOSIS — M542 Cervicalgia: Secondary | ICD-10-CM | POA: Diagnosis not present

## 2023-01-27 DIAGNOSIS — G894 Chronic pain syndrome: Secondary | ICD-10-CM | POA: Diagnosis not present

## 2023-01-27 DIAGNOSIS — M25571 Pain in right ankle and joints of right foot: Secondary | ICD-10-CM | POA: Diagnosis not present

## 2023-01-27 DIAGNOSIS — M25511 Pain in right shoulder: Secondary | ICD-10-CM | POA: Diagnosis not present

## 2023-01-28 DIAGNOSIS — I071 Rheumatic tricuspid insufficiency: Secondary | ICD-10-CM | POA: Diagnosis not present

## 2023-01-28 DIAGNOSIS — T888XXA Other specified complications of surgical and medical care, not elsewhere classified, initial encounter: Secondary | ICD-10-CM | POA: Diagnosis not present

## 2023-01-28 DIAGNOSIS — E538 Deficiency of other specified B group vitamins: Secondary | ICD-10-CM | POA: Diagnosis not present

## 2023-01-28 DIAGNOSIS — N2889 Other specified disorders of kidney and ureter: Secondary | ICD-10-CM | POA: Diagnosis not present

## 2023-01-28 DIAGNOSIS — T8619 Other complication of kidney transplant: Secondary | ICD-10-CM | POA: Diagnosis not present

## 2023-01-28 DIAGNOSIS — D649 Anemia, unspecified: Secondary | ICD-10-CM | POA: Diagnosis not present

## 2023-01-28 DIAGNOSIS — Z79899 Other long term (current) drug therapy: Secondary | ICD-10-CM | POA: Diagnosis not present

## 2023-01-28 DIAGNOSIS — D84821 Immunodeficiency due to drugs: Secondary | ICD-10-CM | POA: Diagnosis not present

## 2023-01-28 DIAGNOSIS — Z940 Kidney transplant status: Secondary | ICD-10-CM | POA: Diagnosis not present

## 2023-01-28 DIAGNOSIS — Z4822 Encounter for aftercare following kidney transplant: Secondary | ICD-10-CM | POA: Diagnosis not present

## 2023-01-28 DIAGNOSIS — R768 Other specified abnormal immunological findings in serum: Secondary | ICD-10-CM | POA: Diagnosis not present

## 2023-01-28 DIAGNOSIS — T8611 Kidney transplant rejection: Secondary | ICD-10-CM | POA: Diagnosis not present

## 2023-01-29 DIAGNOSIS — E1165 Type 2 diabetes mellitus with hyperglycemia: Secondary | ICD-10-CM | POA: Diagnosis not present

## 2023-01-29 DIAGNOSIS — R188 Other ascites: Secondary | ICD-10-CM | POA: Diagnosis not present

## 2023-01-29 DIAGNOSIS — D649 Anemia, unspecified: Secondary | ICD-10-CM | POA: Diagnosis not present

## 2023-01-29 DIAGNOSIS — R768 Other specified abnormal immunological findings in serum: Secondary | ICD-10-CM | POA: Diagnosis not present

## 2023-01-29 DIAGNOSIS — Z940 Kidney transplant status: Secondary | ICD-10-CM | POA: Diagnosis not present

## 2023-01-29 DIAGNOSIS — T888XXA Other specified complications of surgical and medical care, not elsewhere classified, initial encounter: Secondary | ICD-10-CM | POA: Diagnosis not present

## 2023-01-29 DIAGNOSIS — E538 Deficiency of other specified B group vitamins: Secondary | ICD-10-CM | POA: Diagnosis not present

## 2023-01-29 DIAGNOSIS — T8611 Kidney transplant rejection: Secondary | ICD-10-CM | POA: Diagnosis not present

## 2023-01-29 DIAGNOSIS — Z794 Long term (current) use of insulin: Secondary | ICD-10-CM | POA: Diagnosis not present

## 2023-01-29 DIAGNOSIS — I071 Rheumatic tricuspid insufficiency: Secondary | ICD-10-CM | POA: Diagnosis not present

## 2023-01-29 DIAGNOSIS — T8619 Other complication of kidney transplant: Secondary | ICD-10-CM | POA: Diagnosis not present

## 2023-01-29 DIAGNOSIS — D84821 Immunodeficiency due to drugs: Secondary | ICD-10-CM | POA: Diagnosis not present

## 2023-01-29 DIAGNOSIS — Z79899 Other long term (current) drug therapy: Secondary | ICD-10-CM | POA: Diagnosis not present

## 2023-01-29 DIAGNOSIS — N179 Acute kidney failure, unspecified: Secondary | ICD-10-CM | POA: Diagnosis not present

## 2023-01-29 DIAGNOSIS — N2889 Other specified disorders of kidney and ureter: Secondary | ICD-10-CM | POA: Diagnosis not present

## 2023-01-29 DIAGNOSIS — I340 Nonrheumatic mitral (valve) insufficiency: Secondary | ICD-10-CM | POA: Diagnosis not present

## 2023-01-29 DIAGNOSIS — Z4822 Encounter for aftercare following kidney transplant: Secondary | ICD-10-CM | POA: Diagnosis not present

## 2023-01-29 DIAGNOSIS — N186 End stage renal disease: Secondary | ICD-10-CM | POA: Diagnosis not present

## 2023-01-30 DIAGNOSIS — T888XXA Other specified complications of surgical and medical care, not elsewhere classified, initial encounter: Secondary | ICD-10-CM | POA: Diagnosis not present

## 2023-01-30 DIAGNOSIS — Z940 Kidney transplant status: Secondary | ICD-10-CM | POA: Diagnosis not present

## 2023-01-30 DIAGNOSIS — I340 Nonrheumatic mitral (valve) insufficiency: Secondary | ICD-10-CM | POA: Diagnosis not present

## 2023-01-30 DIAGNOSIS — Z794 Long term (current) use of insulin: Secondary | ICD-10-CM | POA: Diagnosis not present

## 2023-01-30 DIAGNOSIS — E1165 Type 2 diabetes mellitus with hyperglycemia: Secondary | ICD-10-CM | POA: Diagnosis not present

## 2023-01-30 DIAGNOSIS — E875 Hyperkalemia: Secondary | ICD-10-CM | POA: Diagnosis not present

## 2023-01-30 DIAGNOSIS — T8611 Kidney transplant rejection: Secondary | ICD-10-CM | POA: Diagnosis not present

## 2023-01-30 DIAGNOSIS — T8619 Other complication of kidney transplant: Secondary | ICD-10-CM | POA: Diagnosis not present

## 2023-01-30 DIAGNOSIS — N179 Acute kidney failure, unspecified: Secondary | ICD-10-CM | POA: Diagnosis not present

## 2023-01-30 DIAGNOSIS — Z4822 Encounter for aftercare following kidney transplant: Secondary | ICD-10-CM | POA: Diagnosis not present

## 2023-01-30 DIAGNOSIS — N2889 Other specified disorders of kidney and ureter: Secondary | ICD-10-CM | POA: Diagnosis not present

## 2023-01-31 DIAGNOSIS — D849 Immunodeficiency, unspecified: Secondary | ICD-10-CM | POA: Diagnosis not present

## 2023-01-31 DIAGNOSIS — N179 Acute kidney failure, unspecified: Secondary | ICD-10-CM | POA: Diagnosis not present

## 2023-01-31 DIAGNOSIS — T888XXS Other specified complications of surgical and medical care, not elsewhere classified, sequela: Secondary | ICD-10-CM | POA: Diagnosis not present

## 2023-01-31 DIAGNOSIS — Z4822 Encounter for aftercare following kidney transplant: Secondary | ICD-10-CM | POA: Diagnosis not present

## 2023-01-31 DIAGNOSIS — T8619 Other complication of kidney transplant: Secondary | ICD-10-CM | POA: Diagnosis not present

## 2023-01-31 DIAGNOSIS — Z940 Kidney transplant status: Secondary | ICD-10-CM | POA: Diagnosis not present

## 2023-02-01 DIAGNOSIS — Q2112 Patent foramen ovale: Secondary | ICD-10-CM | POA: Diagnosis not present

## 2023-02-01 DIAGNOSIS — R931 Abnormal findings on diagnostic imaging of heart and coronary circulation: Secondary | ICD-10-CM | POA: Diagnosis not present

## 2023-02-01 DIAGNOSIS — Z940 Kidney transplant status: Secondary | ICD-10-CM | POA: Diagnosis not present

## 2023-02-01 DIAGNOSIS — D849 Immunodeficiency, unspecified: Secondary | ICD-10-CM | POA: Diagnosis not present

## 2023-02-01 DIAGNOSIS — I517 Cardiomegaly: Secondary | ICD-10-CM | POA: Diagnosis not present

## 2023-02-01 DIAGNOSIS — I361 Nonrheumatic tricuspid (valve) insufficiency: Secondary | ICD-10-CM | POA: Diagnosis not present

## 2023-02-01 DIAGNOSIS — N179 Acute kidney failure, unspecified: Secondary | ICD-10-CM | POA: Diagnosis not present

## 2023-02-02 ENCOUNTER — Telehealth: Payer: Self-pay | Admitting: Family Medicine

## 2023-02-02 ENCOUNTER — Telehealth: Payer: Self-pay | Admitting: *Deleted

## 2023-02-02 DIAGNOSIS — E1022 Type 1 diabetes mellitus with diabetic chronic kidney disease: Secondary | ICD-10-CM

## 2023-02-02 MED ORDER — DEXCOM G7 SENSOR MISC: 3 refills | Status: DC

## 2023-02-02 NOTE — Telephone Encounter (Signed)
Msg sent to rx refill for refill on Continuous Blood Gluc Sensor (DEXCOM G7 SENSOR) MISC [

## 2023-02-02 NOTE — Telephone Encounter (Signed)
Aware refill sent to pharmacy ?

## 2023-02-02 NOTE — Transitions of Care (Post Inpatient/ED Visit) (Signed)
   02/02/2023  Name: Sally Porter MRN: [id number] DOB: 03-May-1980  Today's TOC FU Call Status: Today's TOC FU Call Status:: Unsuccessful Call (1st Attempt) Unsuccessful Call (1st Attempt) Date: 02/02/23  Attempted to reach the patient regarding the most recent Inpatient/ED visit.  Follow Up Plan: Additional outreach attempts will be made to reach the patient to complete the Transitions of Care (Post Inpatient/ED visit) call.   Irving Shows [facility], BSN RN Care Manager/ Transition of Care [place]/ [facility] [phone]

## 2023-02-02 NOTE — Telephone Encounter (Signed)
  Prescription Request  02/02/2023  Is this a "Controlled Substance" medicine? no  Have you seen your PCP in the last 2 weeks? No pt has appt in Feb with PCP  If YES, route message to pool  -  If NO, patient needs to be scheduled for appointment.  What is the name of the medication or equipment? Continuous Blood Gluc Sensor (DEXCOM G7 SENSOR) MISC   Have you contacted your pharmacy to request a refill? yes   Which pharmacy would you like this sent to? [facility]    Patient notified that their request is being sent to the clinical staff for review and that they should receive a response within 2 business days.

## 2023-02-02 NOTE — Telephone Encounter (Signed)
Routed to practice per protocol

## 2023-02-03 ENCOUNTER — Telehealth: Payer: Self-pay | Admitting: *Deleted

## 2023-02-03 DIAGNOSIS — G2581 Restless legs syndrome: Secondary | ICD-10-CM | POA: Diagnosis not present

## 2023-02-03 DIAGNOSIS — Z940 Kidney transplant status: Secondary | ICD-10-CM | POA: Diagnosis not present

## 2023-02-03 DIAGNOSIS — Z79624 Long term (current) use of inhibitors of nucleotide synthesis: Secondary | ICD-10-CM | POA: Diagnosis not present

## 2023-02-03 DIAGNOSIS — T8619 Other complication of kidney transplant: Secondary | ICD-10-CM | POA: Diagnosis not present

## 2023-02-03 DIAGNOSIS — T8611 Kidney transplant rejection: Secondary | ICD-10-CM | POA: Diagnosis not present

## 2023-02-03 DIAGNOSIS — D509 Iron deficiency anemia, unspecified: Secondary | ICD-10-CM | POA: Diagnosis not present

## 2023-02-03 DIAGNOSIS — E785 Hyperlipidemia, unspecified: Secondary | ICD-10-CM | POA: Diagnosis not present

## 2023-02-03 DIAGNOSIS — N179 Acute kidney failure, unspecified: Secondary | ICD-10-CM | POA: Diagnosis not present

## 2023-02-03 DIAGNOSIS — Z79621 Long term (current) use of calcineurin inhibitor: Secondary | ICD-10-CM | POA: Diagnosis not present

## 2023-02-03 DIAGNOSIS — G8929 Other chronic pain: Secondary | ICD-10-CM | POA: Diagnosis not present

## 2023-02-03 DIAGNOSIS — Z7952 Long term (current) use of systemic steroids: Secondary | ICD-10-CM | POA: Diagnosis not present

## 2023-02-03 DIAGNOSIS — Z4822 Encounter for aftercare following kidney transplant: Secondary | ICD-10-CM | POA: Diagnosis not present

## 2023-02-03 DIAGNOSIS — E8770 Fluid overload, unspecified: Secondary | ICD-10-CM | POA: Diagnosis not present

## 2023-02-03 DIAGNOSIS — M545 Low back pain, unspecified: Secondary | ICD-10-CM | POA: Diagnosis not present

## 2023-02-03 DIAGNOSIS — Z79899 Other long term (current) drug therapy: Secondary | ICD-10-CM | POA: Diagnosis not present

## 2023-02-03 DIAGNOSIS — N269 Renal sclerosis, unspecified: Secondary | ICD-10-CM | POA: Diagnosis not present

## 2023-02-03 DIAGNOSIS — N261 Atrophy of kidney (terminal): Secondary | ICD-10-CM | POA: Diagnosis not present

## 2023-02-03 DIAGNOSIS — I071 Rheumatic tricuspid insufficiency: Secondary | ICD-10-CM | POA: Diagnosis not present

## 2023-02-03 DIAGNOSIS — D84821 Immunodeficiency due to drugs: Secondary | ICD-10-CM | POA: Diagnosis not present

## 2023-02-03 DIAGNOSIS — E119 Type 2 diabetes mellitus without complications: Secondary | ICD-10-CM | POA: Diagnosis not present

## 2023-02-03 DIAGNOSIS — Z794 Long term (current) use of insulin: Secondary | ICD-10-CM | POA: Diagnosis not present

## 2023-02-03 DIAGNOSIS — Q2112 Patent foramen ovale: Secondary | ICD-10-CM | POA: Diagnosis not present

## 2023-02-03 NOTE — Transitions of Care (Post Inpatient/ED Visit) (Signed)
   02/03/2023  Name: Avaiyah Belveal MRN: [id number] DOB: 11/23/1980  Today's TOC FU Call Status: Today's TOC FU Call Status:: Unsuccessful Call (2nd Attempt) Unsuccessful Call (2nd Attempt) Date: 02/03/23  Attempted to reach the patient regarding the most recent Inpatient/ED visit.  Follow Up Plan: Additional outreach attempts will be made to reach the patient to complete the Transitions of Care (Post Inpatient/ED visit) call.   Irving Shows [facility], BSN RN Care Manager/ Transition of Care [place]/ [facility] [phone]

## 2023-02-04 ENCOUNTER — Telehealth: Payer: Self-pay

## 2023-02-04 DIAGNOSIS — Z79899 Other long term (current) drug therapy: Secondary | ICD-10-CM | POA: Diagnosis not present

## 2023-02-04 DIAGNOSIS — T8611 Kidney transplant rejection: Secondary | ICD-10-CM | POA: Diagnosis not present

## 2023-02-04 DIAGNOSIS — Z4822 Encounter for aftercare following kidney transplant: Secondary | ICD-10-CM | POA: Diagnosis not present

## 2023-02-04 DIAGNOSIS — N179 Acute kidney failure, unspecified: Secondary | ICD-10-CM | POA: Diagnosis not present

## 2023-02-04 DIAGNOSIS — Z940 Kidney transplant status: Secondary | ICD-10-CM | POA: Diagnosis not present

## 2023-02-04 DIAGNOSIS — I071 Rheumatic tricuspid insufficiency: Secondary | ICD-10-CM | POA: Diagnosis not present

## 2023-02-04 NOTE — Transitions of Care (Post Inpatient/ED Visit) (Signed)
   02/04/2023  Name: Sally Porter MRN: [id number] DOB: [birth year]  Today's TOC FU Call Status: Today's TOC FU Call Status:: Unsuccessful Call (3rd Attempt) Unsuccessful Call (3rd Attempt) Date: 02/04/23  Attempted to reach the patient regarding the most recent Inpatient/ED visit. Upon chart review noted that patient readmitted to hospital on 02/03/23.   Follow Up Plan: No further outreach attempts will be made at this time. We have been unable to contact the patient.    Antionette Fairy, RN,BSN,CCM RN Care Manager Transitions of Care  [place]  Direct Phone: [phone] Toll Free: [phone] Fax: [phone]

## 2023-02-06 DIAGNOSIS — E875 Hyperkalemia: Secondary | ICD-10-CM | POA: Diagnosis not present

## 2023-02-07 ENCOUNTER — Telehealth: Payer: Self-pay | Admitting: Family Medicine

## 2023-02-07 NOTE — Telephone Encounter (Signed)
I do not see any documentation in chart regarding this. Is this something you have knowledge of?

## 2023-02-07 NOTE — Telephone Encounter (Signed)
Please advise on paperwork to have service dog.  Copied from CRM [id number]. Topic: General - Other >> Feb 07, 2023  3:11 PM Whitney O wrote: Reason for CRM: patient is calling about some papers she requested concerning having a service dog. Wants to know if papers are in front office for patient. Call back number [phone]

## 2023-02-08 NOTE — Telephone Encounter (Signed)
Lmtcb to schedule an apt

## 2023-02-10 DIAGNOSIS — D631 Anemia in chronic kidney disease: Secondary | ICD-10-CM | POA: Diagnosis not present

## 2023-02-10 DIAGNOSIS — E1021 Type 1 diabetes mellitus with diabetic nephropathy: Secondary | ICD-10-CM | POA: Diagnosis not present

## 2023-02-10 DIAGNOSIS — D84821 Immunodeficiency due to drugs: Secondary | ICD-10-CM | POA: Diagnosis not present

## 2023-02-10 DIAGNOSIS — I071 Rheumatic tricuspid insufficiency: Secondary | ICD-10-CM | POA: Diagnosis not present

## 2023-02-10 DIAGNOSIS — I120 Hypertensive chronic kidney disease with stage 5 chronic kidney disease or end stage renal disease: Secondary | ICD-10-CM | POA: Diagnosis not present

## 2023-02-10 DIAGNOSIS — Z940 Kidney transplant status: Secondary | ICD-10-CM | POA: Diagnosis not present

## 2023-02-10 DIAGNOSIS — I340 Nonrheumatic mitral (valve) insufficiency: Secondary | ICD-10-CM | POA: Diagnosis not present

## 2023-02-10 DIAGNOSIS — I361 Nonrheumatic tricuspid (valve) insufficiency: Secondary | ICD-10-CM | POA: Diagnosis not present

## 2023-02-10 DIAGNOSIS — Z79899 Other long term (current) drug therapy: Secondary | ICD-10-CM | POA: Diagnosis not present

## 2023-02-10 DIAGNOSIS — N186 End stage renal disease: Secondary | ICD-10-CM | POA: Diagnosis not present

## 2023-02-16 ENCOUNTER — Other Ambulatory Visit: Payer: Self-pay | Admitting: Family Medicine

## 2023-02-18 DIAGNOSIS — Z940 Kidney transplant status: Secondary | ICD-10-CM | POA: Diagnosis not present

## 2023-02-18 DIAGNOSIS — R93421 Abnormal radiologic findings on diagnostic imaging of right kidney: Secondary | ICD-10-CM | POA: Diagnosis not present

## 2023-02-18 DIAGNOSIS — D849 Immunodeficiency, unspecified: Secondary | ICD-10-CM | POA: Diagnosis not present

## 2023-02-18 DIAGNOSIS — I129 Hypertensive chronic kidney disease with stage 1 through stage 4 chronic kidney disease, or unspecified chronic kidney disease: Secondary | ICD-10-CM | POA: Diagnosis not present

## 2023-02-18 DIAGNOSIS — Z48298 Encounter for aftercare following other organ transplant: Secondary | ICD-10-CM | POA: Diagnosis not present

## 2023-02-18 DIAGNOSIS — Z4822 Encounter for aftercare following kidney transplant: Secondary | ICD-10-CM | POA: Diagnosis not present

## 2023-02-18 DIAGNOSIS — D84821 Immunodeficiency due to drugs: Secondary | ICD-10-CM | POA: Diagnosis not present

## 2023-02-18 DIAGNOSIS — Z79621 Long term (current) use of calcineurin inhibitor: Secondary | ICD-10-CM | POA: Diagnosis not present

## 2023-02-18 DIAGNOSIS — Z79899 Other long term (current) drug therapy: Secondary | ICD-10-CM | POA: Diagnosis not present

## 2023-02-18 DIAGNOSIS — E1021 Type 1 diabetes mellitus with diabetic nephropathy: Secondary | ICD-10-CM | POA: Diagnosis not present

## 2023-02-18 DIAGNOSIS — Z5181 Encounter for therapeutic drug level monitoring: Secondary | ICD-10-CM | POA: Diagnosis not present

## 2023-02-18 DIAGNOSIS — I10 Essential (primary) hypertension: Secondary | ICD-10-CM | POA: Diagnosis not present

## 2023-02-18 DIAGNOSIS — E875 Hyperkalemia: Secondary | ICD-10-CM | POA: Diagnosis not present

## 2023-02-19 DIAGNOSIS — R944 Abnormal results of kidney function studies: Secondary | ICD-10-CM | POA: Diagnosis not present

## 2023-02-19 DIAGNOSIS — E875 Hyperkalemia: Secondary | ICD-10-CM | POA: Diagnosis not present

## 2023-02-19 DIAGNOSIS — R079 Chest pain, unspecified: Secondary | ICD-10-CM | POA: Diagnosis not present

## 2023-02-21 ENCOUNTER — Telehealth: Payer: Self-pay | Admitting: *Deleted

## 2023-02-21 DIAGNOSIS — Z940 Kidney transplant status: Secondary | ICD-10-CM | POA: Diagnosis not present

## 2023-02-21 NOTE — Transitions of Care (Post Inpatient/ED Visit) (Signed)
   02/21/2023  Name: Tayha Limone MRN: [id number] DOB: Mar 22, 1980  Today's TOC FU Call Status: Today's TOC FU Call Status:: Unsuccessful Call (1st Attempt) Unsuccessful Call (1st Attempt) Date: 02/21/23  Attempted to reach the patient regarding the most recent Inpatient/ED visit.  Follow Up Plan: Additional outreach attempts will be made to reach the patient to complete the Transitions of Care (Post Inpatient/ED visit) call.   Irving Shows [facility], BSN RN Care Manager/ Transition of Care [place]/ [facility] [phone]

## 2023-02-22 ENCOUNTER — Telehealth: Payer: Self-pay | Admitting: *Deleted

## 2023-02-22 ENCOUNTER — Other Ambulatory Visit: Payer: Self-pay | Admitting: Family Medicine

## 2023-02-22 DIAGNOSIS — N186 End stage renal disease: Secondary | ICD-10-CM

## 2023-02-22 NOTE — Transitions of Care (Post Inpatient/ED Visit) (Signed)
   02/22/2023  Name: Sally Porter MRN: [id number] DOB: 03/08/1980  Today's TOC FU Call Status: Today's TOC FU Call Status:: Unsuccessful Call (2nd Attempt) Unsuccessful Call (2nd Attempt) Date: 02/22/23  Attempted to reach the patient regarding the most recent Inpatient/ED visit.  Follow Up Plan: Additional outreach attempts will be made to reach the patient to complete the Transitions of Care (Post Inpatient/ED visit) call.   Irving Shows [facility], BSN RN Care Manager/ Transition of Care [place]/ [facility] [phone]

## 2023-02-25 ENCOUNTER — Telehealth: Payer: Self-pay

## 2023-02-25 DIAGNOSIS — D631 Anemia in chronic kidney disease: Secondary | ICD-10-CM | POA: Diagnosis not present

## 2023-02-25 DIAGNOSIS — Z940 Kidney transplant status: Secondary | ICD-10-CM | POA: Diagnosis not present

## 2023-02-25 DIAGNOSIS — Z4822 Encounter for aftercare following kidney transplant: Secondary | ICD-10-CM | POA: Diagnosis not present

## 2023-02-25 DIAGNOSIS — T8619 Other complication of kidney transplant: Secondary | ICD-10-CM | POA: Diagnosis not present

## 2023-02-25 DIAGNOSIS — D849 Immunodeficiency, unspecified: Secondary | ICD-10-CM | POA: Diagnosis not present

## 2023-02-25 DIAGNOSIS — I151 Hypertension secondary to other renal disorders: Secondary | ICD-10-CM | POA: Diagnosis not present

## 2023-02-25 DIAGNOSIS — N178 Other acute kidney failure: Secondary | ICD-10-CM | POA: Diagnosis not present

## 2023-02-25 DIAGNOSIS — N184 Chronic kidney disease, stage 4 (severe): Secondary | ICD-10-CM | POA: Diagnosis not present

## 2023-02-25 NOTE — Transitions of Care (Post Inpatient/ED Visit) (Signed)
   02/25/2023  Name: Sally Porter MRN: [id number] DOB: 1981-01-19  Today's TOC FU Call Status: Today's TOC FU Call Status:: Unsuccessful Call (3rd Attempt) Unsuccessful Call (3rd Attempt) Date: 02/25/23  Attempted to reach the patient regarding the most recent Inpatient/ED visit.  Follow Up Plan: No further outreach attempts will be made at this time. We have been unable to contact the patient.  Alyse Low, RN, BA, [facility], CRRN [facility] Coordinator, Transition of Care Ph # [phone]

## 2023-02-26 DIAGNOSIS — M25571 Pain in right ankle and joints of right foot: Secondary | ICD-10-CM | POA: Diagnosis not present

## 2023-02-26 DIAGNOSIS — M542 Cervicalgia: Secondary | ICD-10-CM | POA: Diagnosis not present

## 2023-02-26 DIAGNOSIS — M25511 Pain in right shoulder: Secondary | ICD-10-CM | POA: Diagnosis not present

## 2023-02-26 DIAGNOSIS — G894 Chronic pain syndrome: Secondary | ICD-10-CM | POA: Diagnosis not present

## 2023-02-26 DIAGNOSIS — M25512 Pain in left shoulder: Secondary | ICD-10-CM | POA: Diagnosis not present

## 2023-02-28 DIAGNOSIS — Z4822 Encounter for aftercare following kidney transplant: Secondary | ICD-10-CM | POA: Diagnosis not present

## 2023-02-28 DIAGNOSIS — Z940 Kidney transplant status: Secondary | ICD-10-CM | POA: Diagnosis not present

## 2023-02-28 DIAGNOSIS — D649 Anemia, unspecified: Secondary | ICD-10-CM | POA: Diagnosis not present

## 2023-02-28 DIAGNOSIS — D849 Immunodeficiency, unspecified: Secondary | ICD-10-CM | POA: Diagnosis not present

## 2023-02-28 DIAGNOSIS — E785 Hyperlipidemia, unspecified: Secondary | ICD-10-CM | POA: Diagnosis not present

## 2023-02-28 DIAGNOSIS — Z79621 Long term (current) use of calcineurin inhibitor: Secondary | ICD-10-CM | POA: Diagnosis not present

## 2023-02-28 DIAGNOSIS — L0390 Cellulitis, unspecified: Secondary | ICD-10-CM | POA: Diagnosis not present

## 2023-02-28 DIAGNOSIS — I10 Essential (primary) hypertension: Secondary | ICD-10-CM | POA: Diagnosis not present

## 2023-02-28 DIAGNOSIS — Z5181 Encounter for therapeutic drug level monitoring: Secondary | ICD-10-CM | POA: Diagnosis not present

## 2023-03-08 ENCOUNTER — Ambulatory Visit: Payer: 59 | Admitting: Family Medicine

## 2023-03-09 DIAGNOSIS — N179 Acute kidney failure, unspecified: Secondary | ICD-10-CM | POA: Diagnosis not present

## 2023-03-09 DIAGNOSIS — Z940 Kidney transplant status: Secondary | ICD-10-CM | POA: Diagnosis not present

## 2023-03-09 DIAGNOSIS — T8619 Other complication of kidney transplant: Secondary | ICD-10-CM | POA: Diagnosis not present

## 2023-03-09 DIAGNOSIS — I10 Essential (primary) hypertension: Secondary | ICD-10-CM | POA: Diagnosis not present

## 2023-03-09 DIAGNOSIS — Z79899 Other long term (current) drug therapy: Secondary | ICD-10-CM | POA: Diagnosis not present

## 2023-03-09 DIAGNOSIS — T888XXD Other specified complications of surgical and medical care, not elsewhere classified, subsequent encounter: Secondary | ICD-10-CM | POA: Diagnosis not present

## 2023-03-09 DIAGNOSIS — D849 Immunodeficiency, unspecified: Secondary | ICD-10-CM | POA: Diagnosis not present

## 2023-03-09 DIAGNOSIS — D509 Iron deficiency anemia, unspecified: Secondary | ICD-10-CM | POA: Diagnosis not present

## 2023-03-09 DIAGNOSIS — R188 Other ascites: Secondary | ICD-10-CM | POA: Diagnosis not present

## 2023-03-09 DIAGNOSIS — Z4822 Encounter for aftercare following kidney transplant: Secondary | ICD-10-CM | POA: Diagnosis not present

## 2023-03-15 ENCOUNTER — Ambulatory Visit: Payer: 59 | Admitting: Family Medicine

## 2023-03-15 DIAGNOSIS — E782 Mixed hyperlipidemia: Secondary | ICD-10-CM | POA: Diagnosis not present

## 2023-03-15 DIAGNOSIS — Z79621 Long term (current) use of calcineurin inhibitor: Secondary | ICD-10-CM | POA: Diagnosis not present

## 2023-03-15 DIAGNOSIS — D84821 Immunodeficiency due to drugs: Secondary | ICD-10-CM | POA: Diagnosis not present

## 2023-03-15 DIAGNOSIS — E1065 Type 1 diabetes mellitus with hyperglycemia: Secondary | ICD-10-CM | POA: Diagnosis not present

## 2023-03-15 DIAGNOSIS — N186 End stage renal disease: Secondary | ICD-10-CM | POA: Diagnosis not present

## 2023-03-15 DIAGNOSIS — N185 Chronic kidney disease, stage 5: Secondary | ICD-10-CM | POA: Diagnosis not present

## 2023-03-15 DIAGNOSIS — L02211 Cutaneous abscess of abdominal wall: Secondary | ICD-10-CM | POA: Diagnosis not present

## 2023-03-15 DIAGNOSIS — T8612 Kidney transplant failure: Secondary | ICD-10-CM | POA: Diagnosis not present

## 2023-03-15 DIAGNOSIS — D62 Acute posthemorrhagic anemia: Secondary | ICD-10-CM | POA: Diagnosis not present

## 2023-03-15 DIAGNOSIS — Z79899 Other long term (current) drug therapy: Secondary | ICD-10-CM | POA: Diagnosis not present

## 2023-03-15 DIAGNOSIS — I10 Essential (primary) hypertension: Secondary | ICD-10-CM | POA: Diagnosis not present

## 2023-03-15 DIAGNOSIS — D649 Anemia, unspecified: Secondary | ICD-10-CM | POA: Diagnosis not present

## 2023-03-15 DIAGNOSIS — D8489 Other immunodeficiencies: Secondary | ICD-10-CM | POA: Diagnosis not present

## 2023-03-15 DIAGNOSIS — E10610 Type 1 diabetes mellitus with diabetic neuropathic arthropathy: Secondary | ICD-10-CM | POA: Diagnosis not present

## 2023-03-15 DIAGNOSIS — N179 Acute kidney failure, unspecified: Secondary | ICD-10-CM | POA: Diagnosis not present

## 2023-03-15 DIAGNOSIS — Z940 Kidney transplant status: Secondary | ICD-10-CM | POA: Diagnosis not present

## 2023-03-15 DIAGNOSIS — E875 Hyperkalemia: Secondary | ICD-10-CM | POA: Diagnosis not present

## 2023-03-15 DIAGNOSIS — G2581 Restless legs syndrome: Secondary | ICD-10-CM | POA: Diagnosis not present

## 2023-03-15 DIAGNOSIS — N2889 Other specified disorders of kidney and ureter: Secondary | ICD-10-CM | POA: Diagnosis not present

## 2023-03-15 DIAGNOSIS — Z5181 Encounter for therapeutic drug level monitoring: Secondary | ICD-10-CM | POA: Diagnosis not present

## 2023-03-15 DIAGNOSIS — Z992 Dependence on renal dialysis: Secondary | ICD-10-CM | POA: Diagnosis not present

## 2023-03-15 DIAGNOSIS — T8141XA Infection following a procedure, superficial incisional surgical site, initial encounter: Secondary | ICD-10-CM | POA: Diagnosis not present

## 2023-03-15 DIAGNOSIS — S301XXA Contusion of abdominal wall, initial encounter: Secondary | ICD-10-CM | POA: Diagnosis not present

## 2023-03-15 DIAGNOSIS — Z4822 Encounter for aftercare following kidney transplant: Secondary | ICD-10-CM | POA: Diagnosis not present

## 2023-03-15 DIAGNOSIS — T8619 Other complication of kidney transplant: Secondary | ICD-10-CM | POA: Diagnosis not present

## 2023-03-15 DIAGNOSIS — T888XXD Other specified complications of surgical and medical care, not elsewhere classified, subsequent encounter: Secondary | ICD-10-CM | POA: Diagnosis not present

## 2023-03-15 DIAGNOSIS — E1122 Type 2 diabetes mellitus with diabetic chronic kidney disease: Secondary | ICD-10-CM | POA: Diagnosis not present

## 2023-03-15 DIAGNOSIS — Z794 Long term (current) use of insulin: Secondary | ICD-10-CM | POA: Diagnosis not present

## 2023-03-15 DIAGNOSIS — E8770 Fluid overload, unspecified: Secondary | ICD-10-CM | POA: Diagnosis not present

## 2023-03-17 ENCOUNTER — Other Ambulatory Visit: Payer: Self-pay | Admitting: Family Medicine

## 2023-03-17 DIAGNOSIS — G2581 Restless legs syndrome: Secondary | ICD-10-CM

## 2023-03-17 DIAGNOSIS — E782 Mixed hyperlipidemia: Secondary | ICD-10-CM

## 2023-03-17 DIAGNOSIS — F5101 Primary insomnia: Secondary | ICD-10-CM

## 2023-03-18 ENCOUNTER — Encounter: Payer: Self-pay | Admitting: Family Medicine

## 2023-03-21 ENCOUNTER — Telehealth: Payer: Self-pay | Admitting: *Deleted

## 2023-03-21 NOTE — Transitions of Care (Post Inpatient/ED Visit) (Signed)
   03/21/2023  Name: Sally Porter MRN: [id number] DOB: [birth year]  Today's TOC FU Call Status: Today's TOC FU Call Status:: Unsuccessful Call (1st Attempt) Unsuccessful Call (1st Attempt) Date: 03/21/23  Attempted to reach the patient regarding the most recent Inpatient/ED visit.  Follow Up Plan: Additional outreach attempts will be made to reach the patient to complete the Transitions of Care (Post Inpatient/ED visit) call.   Irving Shows [facility], BSN RN Care Manager/ Transition of Care / [facility] [phone]

## 2023-03-22 ENCOUNTER — Telehealth: Payer: Self-pay

## 2023-03-22 NOTE — Transitions of Care (Post Inpatient/ED Visit) (Signed)
   03/22/2023  Name: Keyannah Boodoo MRN: [id number] DOB: 11-17-1980  Today's TOC FU Call Status: Today's TOC FU Call Status:: Unsuccessful Call (2nd Attempt) Unsuccessful Call (2nd Attempt) Date: 03/22/23  Attempted to reach the patient regarding the most recent Inpatient/ED visit.  Follow Up Plan: Additional outreach attempts will be made to reach the patient to complete the Transitions of Care (Post Inpatient/ED visit) call.   Lonia Chimera, RN, BSN, CEN [employer]- Transition of Care Team.  [facility] [phone]

## 2023-03-23 ENCOUNTER — Telehealth: Payer: Self-pay

## 2023-03-23 NOTE — Transitions of Care (Post Inpatient/ED Visit) (Signed)
   03/23/2023  Name: Sally Porter MRN: [id number] DOB: 1980/10/15  Today's TOC FU Call Status: Today's TOC FU Call Status:: Unsuccessful Call (3rd Attempt) Unsuccessful Call (3rd Attempt) Date: 03/23/23  Attempted to reach the patient regarding the most recent Inpatient/ED visit.  Follow Up Plan: No further outreach attempts will be made at this time. We have been unable to contact the patient.  Lonia Chimera, RN, BSN, CEN [employer]- Transition of Care Team.  [facility] [phone]

## 2023-03-24 DIAGNOSIS — T8619 Other complication of kidney transplant: Secondary | ICD-10-CM | POA: Diagnosis not present

## 2023-03-24 DIAGNOSIS — D529 Folate deficiency anemia, unspecified: Secondary | ICD-10-CM | POA: Diagnosis not present

## 2023-03-24 DIAGNOSIS — Z4822 Encounter for aftercare following kidney transplant: Secondary | ICD-10-CM | POA: Diagnosis not present

## 2023-03-24 DIAGNOSIS — E8770 Fluid overload, unspecified: Secondary | ICD-10-CM | POA: Diagnosis not present

## 2023-03-24 DIAGNOSIS — N2889 Other specified disorders of kidney and ureter: Secondary | ICD-10-CM | POA: Diagnosis not present

## 2023-03-24 DIAGNOSIS — Z5181 Encounter for therapeutic drug level monitoring: Secondary | ICD-10-CM | POA: Diagnosis not present

## 2023-03-24 DIAGNOSIS — E109 Type 1 diabetes mellitus without complications: Secondary | ICD-10-CM | POA: Diagnosis not present

## 2023-03-24 DIAGNOSIS — D84821 Immunodeficiency due to drugs: Secondary | ICD-10-CM | POA: Diagnosis not present

## 2023-03-24 DIAGNOSIS — Z79621 Long term (current) use of calcineurin inhibitor: Secondary | ICD-10-CM | POA: Diagnosis not present

## 2023-03-24 DIAGNOSIS — I10 Essential (primary) hypertension: Secondary | ICD-10-CM | POA: Diagnosis not present

## 2023-03-24 DIAGNOSIS — Z940 Kidney transplant status: Secondary | ICD-10-CM | POA: Diagnosis not present

## 2023-03-29 DIAGNOSIS — M25571 Pain in right ankle and joints of right foot: Secondary | ICD-10-CM | POA: Diagnosis not present

## 2023-03-29 DIAGNOSIS — M25511 Pain in right shoulder: Secondary | ICD-10-CM | POA: Diagnosis not present

## 2023-03-29 DIAGNOSIS — M25512 Pain in left shoulder: Secondary | ICD-10-CM | POA: Diagnosis not present

## 2023-03-29 DIAGNOSIS — G894 Chronic pain syndrome: Secondary | ICD-10-CM | POA: Diagnosis not present

## 2023-03-29 DIAGNOSIS — M542 Cervicalgia: Secondary | ICD-10-CM | POA: Diagnosis not present

## 2023-04-01 DIAGNOSIS — D649 Anemia, unspecified: Secondary | ICD-10-CM | POA: Diagnosis not present

## 2023-04-01 DIAGNOSIS — Z4822 Encounter for aftercare following kidney transplant: Secondary | ICD-10-CM | POA: Diagnosis not present

## 2023-04-01 DIAGNOSIS — I071 Rheumatic tricuspid insufficiency: Secondary | ICD-10-CM | POA: Diagnosis not present

## 2023-04-01 DIAGNOSIS — Z940 Kidney transplant status: Secondary | ICD-10-CM | POA: Diagnosis not present

## 2023-04-01 DIAGNOSIS — N2889 Other specified disorders of kidney and ureter: Secondary | ICD-10-CM | POA: Diagnosis not present

## 2023-04-01 DIAGNOSIS — I152 Hypertension secondary to endocrine disorders: Secondary | ICD-10-CM | POA: Diagnosis not present

## 2023-04-01 DIAGNOSIS — T8619 Other complication of kidney transplant: Secondary | ICD-10-CM | POA: Diagnosis not present

## 2023-04-01 DIAGNOSIS — Z5181 Encounter for therapeutic drug level monitoring: Secondary | ICD-10-CM | POA: Diagnosis not present

## 2023-04-01 DIAGNOSIS — Z794 Long term (current) use of insulin: Secondary | ICD-10-CM | POA: Diagnosis not present

## 2023-04-01 DIAGNOSIS — Z79621 Long term (current) use of calcineurin inhibitor: Secondary | ICD-10-CM | POA: Diagnosis not present

## 2023-04-01 DIAGNOSIS — E119 Type 2 diabetes mellitus without complications: Secondary | ICD-10-CM | POA: Diagnosis not present

## 2023-04-08 DIAGNOSIS — Z940 Kidney transplant status: Secondary | ICD-10-CM | POA: Diagnosis not present

## 2023-04-08 DIAGNOSIS — Z79621 Long term (current) use of calcineurin inhibitor: Secondary | ICD-10-CM | POA: Diagnosis not present

## 2023-04-08 DIAGNOSIS — Z4822 Encounter for aftercare following kidney transplant: Secondary | ICD-10-CM | POA: Diagnosis not present

## 2023-04-08 DIAGNOSIS — Z5181 Encounter for therapeutic drug level monitoring: Secondary | ICD-10-CM | POA: Diagnosis not present

## 2023-04-08 DIAGNOSIS — E8770 Fluid overload, unspecified: Secondary | ICD-10-CM | POA: Diagnosis not present

## 2023-04-12 DIAGNOSIS — E119 Type 2 diabetes mellitus without complications: Secondary | ICD-10-CM | POA: Diagnosis not present

## 2023-04-12 DIAGNOSIS — R079 Chest pain, unspecified: Secondary | ICD-10-CM | POA: Diagnosis not present

## 2023-04-12 DIAGNOSIS — Z87891 Personal history of nicotine dependence: Secondary | ICD-10-CM | POA: Diagnosis not present

## 2023-04-12 DIAGNOSIS — E785 Hyperlipidemia, unspecified: Secondary | ICD-10-CM | POA: Diagnosis not present

## 2023-04-12 DIAGNOSIS — R509 Fever, unspecified: Secondary | ICD-10-CM | POA: Diagnosis not present

## 2023-04-12 DIAGNOSIS — I517 Cardiomegaly: Secondary | ICD-10-CM | POA: Diagnosis not present

## 2023-04-12 DIAGNOSIS — Z20822 Contact with and (suspected) exposure to covid-19: Secondary | ICD-10-CM | POA: Diagnosis not present

## 2023-04-12 DIAGNOSIS — N390 Urinary tract infection, site not specified: Secondary | ICD-10-CM | POA: Diagnosis not present

## 2023-04-12 DIAGNOSIS — J40 Bronchitis, not specified as acute or chronic: Secondary | ICD-10-CM | POA: Diagnosis not present

## 2023-04-12 DIAGNOSIS — I10 Essential (primary) hypertension: Secondary | ICD-10-CM | POA: Diagnosis not present

## 2023-04-13 DIAGNOSIS — R079 Chest pain, unspecified: Secondary | ICD-10-CM | POA: Diagnosis not present

## 2023-04-13 DIAGNOSIS — I517 Cardiomegaly: Secondary | ICD-10-CM | POA: Diagnosis not present

## 2023-04-13 DIAGNOSIS — J40 Bronchitis, not specified as acute or chronic: Secondary | ICD-10-CM | POA: Diagnosis not present

## 2023-04-19 DIAGNOSIS — G894 Chronic pain syndrome: Secondary | ICD-10-CM | POA: Diagnosis not present

## 2023-04-19 DIAGNOSIS — Z79899 Other long term (current) drug therapy: Secondary | ICD-10-CM | POA: Diagnosis not present

## 2023-04-19 DIAGNOSIS — M25571 Pain in right ankle and joints of right foot: Secondary | ICD-10-CM | POA: Diagnosis not present

## 2023-04-19 DIAGNOSIS — M5459 Other low back pain: Secondary | ICD-10-CM | POA: Diagnosis not present

## 2023-04-19 DIAGNOSIS — M542 Cervicalgia: Secondary | ICD-10-CM | POA: Diagnosis not present

## 2023-04-19 DIAGNOSIS — Z79891 Long term (current) use of opiate analgesic: Secondary | ICD-10-CM | POA: Diagnosis not present

## 2023-04-19 DIAGNOSIS — M25511 Pain in right shoulder: Secondary | ICD-10-CM | POA: Diagnosis not present

## 2023-04-19 DIAGNOSIS — M25512 Pain in left shoulder: Secondary | ICD-10-CM | POA: Diagnosis not present

## 2023-04-19 DIAGNOSIS — M25572 Pain in left ankle and joints of left foot: Secondary | ICD-10-CM | POA: Diagnosis not present

## 2023-04-22 DIAGNOSIS — Z79621 Long term (current) use of calcineurin inhibitor: Secondary | ICD-10-CM | POA: Diagnosis not present

## 2023-04-22 DIAGNOSIS — I071 Rheumatic tricuspid insufficiency: Secondary | ICD-10-CM | POA: Diagnosis not present

## 2023-04-22 DIAGNOSIS — I340 Nonrheumatic mitral (valve) insufficiency: Secondary | ICD-10-CM | POA: Diagnosis not present

## 2023-04-22 DIAGNOSIS — E1065 Type 1 diabetes mellitus with hyperglycemia: Secondary | ICD-10-CM | POA: Diagnosis not present

## 2023-04-22 DIAGNOSIS — E8770 Fluid overload, unspecified: Secondary | ICD-10-CM | POA: Diagnosis not present

## 2023-04-22 DIAGNOSIS — Z5181 Encounter for therapeutic drug level monitoring: Secondary | ICD-10-CM | POA: Diagnosis not present

## 2023-04-22 DIAGNOSIS — Z4822 Encounter for aftercare following kidney transplant: Secondary | ICD-10-CM | POA: Diagnosis not present

## 2023-04-22 DIAGNOSIS — Z940 Kidney transplant status: Secondary | ICD-10-CM | POA: Diagnosis not present

## 2023-04-27 ENCOUNTER — Encounter: Payer: Self-pay | Admitting: Family Medicine

## 2023-04-27 ENCOUNTER — Ambulatory Visit: Payer: 59 | Admitting: Family Medicine

## 2023-04-27 VITALS — BP 149/73 | HR 64 | Temp 97.2°F | Ht 68.0 in | Wt 223.8 lb

## 2023-04-27 DIAGNOSIS — E108 Type 1 diabetes mellitus with unspecified complications: Secondary | ICD-10-CM

## 2023-04-27 DIAGNOSIS — E1069 Type 1 diabetes mellitus with other specified complication: Secondary | ICD-10-CM

## 2023-04-27 DIAGNOSIS — Z940 Kidney transplant status: Secondary | ICD-10-CM | POA: Diagnosis not present

## 2023-04-27 DIAGNOSIS — Z794 Long term (current) use of insulin: Secondary | ICD-10-CM | POA: Diagnosis not present

## 2023-04-27 DIAGNOSIS — I152 Hypertension secondary to endocrine disorders: Secondary | ICD-10-CM

## 2023-04-27 DIAGNOSIS — E782 Mixed hyperlipidemia: Secondary | ICD-10-CM | POA: Diagnosis not present

## 2023-04-27 NOTE — Progress Notes (Signed)
 Subjective:  Patient ID: Sally Porter, female    DOB: October 15, 1980, 43 y.o.   MRN: [id number]  Patient Care Team: Sonny Masters, FNP as PCP - General (Family Medicine) Danella Maiers, [facility] as Pharmacist (Family Medicine) [place], [facility], Debarah Crape Tenny Craw, MD as Referring Physician [facility])   Chief Complaint:  letter for service dog   HPI: Sally Porter is a 43 y.o. female presenting on 04/27/2023 for letter for service dog   Discussed the use of AI scribe software for clinical note transcription with the patient, who gave verbal consent to proceed.  History of Present Illness   Sally Porter is a 43 year old female who presents for a service dog letter.  She has a miniature Duncan Dull that is two years old.  Following her kidney transplant, she experienced initial difficulties with medication adjustments, particularly with the rejection medicine, which required changes. She is currently receiving a monthly infusion called Bella, which she has had three injections of, and is doing well with it. She is dialysis-free and attends regular follow-up appointments. Initially, she had to visit twice a week, then once a week, and now she goes twice a month. She also sees cardiology regularly.  Regarding her diabetes management post-transplant, she has been unable to see her new endocrinologist due to frequent hospital visits for medication regulation. Her insulin is currently being managed by [facility], and she has an appointment scheduled with the new endocrinologist on the 23rd of next month. Her blood sugars spike after taking prednisone, which she takes once daily at a dose of 5 mg. This medication is expected to continue until October 13th, marking a year post-transplant. She manages the spikes with insulin and reports no significant symptoms like increased hunger or thirst.  Her blood pressure was previously affected by a steroid administered  during a hospital stay, leading to a change in her blood pressure medications. She is now on new medication and reports well-controlled blood pressure.          Relevant past medical, surgical, family, and social history reviewed and updated as indicated.  Allergies and medications reviewed and updated. Data reviewed: Chart in Epic.   Past Medical History:  Diagnosis Date   Anemia    Anxiety    Depression    Diabetes mellitus without complication (HCC)    Reported history of DM type 1   ESRD (end stage renal disease) (HCC)    short term hemodialysis 06/2017; resumed HD 08/15/21   Glaucoma    Hyperlipidemia    Hypertension     Past Surgical History:  Procedure Laterality Date   ANKLE SURGERY Right    AV FISTULA PLACEMENT Left 08/19/2021   Procedure: Creation of Brachial Cephalic LEFT ARM ARTERIOVENOUS (AV) FISTULA;  Surgeon: Leonie Douglas, MD;  Location: [facility] OR;  Service: Vascular;  Laterality: Left;  PERIPHERAL NERVE BLOCK   CESAREAN SECTION  2009   FISTULA SUPERFICIALIZATION Left 11/19/2021   Procedure: LEFT ARM ARTERIOVENOUS FISTULA SUPERFICIALIZATION AND SIDE BRANCH LIGATION;  Surgeon: Leonie Douglas, MD;  Location: [facility] OR;  Service: Vascular;  Laterality: Left;  PERIPHERAL NERVE BLOCK   FOOT SURGERY Left    IR FLUORO GUIDE CV LINE RIGHT  08/15/2021   IR US GUIDE VASC ACCESS RIGHT  08/15/2021   kidney transplant     REMOVAL OF A DIALYSIS CATHETER N/A 01/26/2022   Procedure: MINOR REMOVAL OF A DIALYSIS CATHETER;  Surgeon: Larina Earthly,  MD;  Location: [facility] ORS;  Service: Vascular;  Laterality: N/A;    Social History   Socioeconomic History   Marital status: Significant Other    Spouse name: Molly Maduro   Number of children: 1   Years of education: 13   Highest education level: Some college, no degree  Occupational History   Not on file  Tobacco Use   Smoking status: Former    Current packs/day: 0.00    Average packs/day: 0.5 packs/day for 3.0 years (1.5 ttl pk-yrs)     Types: Cigarettes    Start date: 2010    Quit date: 2013    Years since quitting: 12.1   Smokeless tobacco: Never  Vaping Use   Vaping status: Never Used  Substance and Sexual Activity   Alcohol use: Not Currently   Drug use: Never   Sexual activity: Not Currently    Birth control/protection: None  Other Topics Concern   Not on file  Social History Narrative   Not on file   Social Drivers of Health   Financial Resource Strain: Low Risk  (01/11/2023)   Overall Financial Resource Strain (CARDIA)    Difficulty of Paying Living Expenses: Not hard at all  Food Insecurity: Low Risk  (03/15/2023)   Received from [facility]   Hunger Vital Sign    Worried About Running Out of Food in the Last Year: Never true    Ran Out of Food in the Last Year: Never true  Transportation Needs: No Transportation Needs (03/15/2023)   Received from [facility]    In the past 12 months, has lack of reliable transportation kept you from medical appointments, meetings, work or from getting things needed for daily living? : No  Physical Activity: Insufficiently Active (01/11/2023)   Exercise Vital Sign    Days of Exercise per Week: 3 days    Minutes of Exercise per Session: 30 min  Stress: No Stress Concern Present (01/11/2023)   [employer] of Occupational Health - Occupational Stress Questionnaire    Feeling of Stress : Not at all  Social Connections: Moderately Integrated (01/11/2023)   Social Connection and Isolation Panel [NHANES]    Frequency of Communication with Friends and Family: More than three times a week    Frequency of Social Gatherings with Friends and Family: More than three times a week    Attends Religious Services: 1 to 4 times per year    Active Member of [employer] or Organizations: No    Attends Banker Meetings: Never    Marital Status: Living with partner  Intimate Partner Violence: Not At Risk (01/11/2023)   Humiliation, Afraid, Rape, and  Kick questionnaire    Fear of Current or Ex-Partner: No    Emotionally Abused: No    Physically Abused: No    Sexually Abused: No    Outpatient Encounter Medications as of 04/27/2023  Medication Sig   acetaminophen (TYLENOL) 325 MG tablet Take 650 mg by mouth every 6 (six) hours as needed.   albuterol (VENTOLIN HFA) 108 (90 Base) MCG/ACT inhaler Inhale 2 puffs into the lungs every 6 (six) hours as needed for wheezing or shortness of breath.   atorvastatin (LIPITOR) 20 MG tablet TAKE 1 TABLET BY MOUTH DAILY AT 6PM   Continuous Glucose Sensor (DEXCOM G7 SENSOR) MISC Apply to arm every 10 days to test blood sugar continuously. DX: E11.65   ferrous sulfate 325 (65 FE) MG tablet Take 325 mg by mouth daily with breakfast.  ferrous sulfate ER (SLOW FE) 142 (45 Fe) MG TBCR tablet Take by mouth.   fluticasone (FLONASE) 50 MCG/ACT nasal spray Place 2 sprays into both nostrils daily.   insulin glargine (LANTUS SOLOSTAR) 100 UNIT/ML Solostar Pen INJECT 30 units into THE SKIN AT BEDTIME   insulin lispro (HUMALOG KWIKPEN) 100 UNIT/ML KwikPen Inject 0-15 Units into the skin 3 (three) times daily. Sliding scale   Insulin Pen Needle 32G X 4 MM MISC Use with insulin TID Dx E11.65   LASIX 40 MG tablet Take 40 mg by mouth daily.   lidocaine (XYLOCAINE) 5 % ointment Apply 1 Application topically 2 (two) times daily as needed for moderate pain.   mycophenolate (MYFORTIC) 180 MG EC tablet Take by mouth.   naloxone (NARCAN) nasal spray 4 mg/0.1 mL Place 0.4 mg into the nose once.   nortriptyline (PAMELOR) 25 MG capsule TAKE ONE CAPSULE BY MOUTH TWICE DAILY   Oxycodone HCl 10 MG TABS Take 10 mg by mouth every 6 (six) hours.   polyethylene glycol (MIRALAX / GLYCOLAX) 17 g packet Take 17 g by mouth daily. (Patient taking differently: Take 17 g by mouth daily as needed for moderate constipation or mild constipation.)   predniSONE (DELTASONE) 5 MG tablet Take 5 mg by mouth daily.   senna-docusate (SENOKOT-S) 8.6-50 MG  tablet Take 1 tablet by mouth 2 (two) times daily. (Patient taking differently: Take 1 tablet by mouth daily as needed for moderate constipation or mild constipation.)   senna-docusate (SENOKOT-S) 8.6-50 MG tablet Take by mouth.   Sodium Zirconium Cyclosilicate (LOKELMA PO) Take by mouth.   tacrolimus (PROGRAF) 1 MG capsule Take by mouth.   zinc gluconate 50 MG tablet Take 50 mg by mouth daily.   methocarbamol (ROBAXIN) 500 MG tablet Take by mouth. (Patient not taking: Reported on 04/27/2023)   rOPINIRole (REQUIP) 0.5 MG tablet TAKE 2 TABLETS BY MOUTH AT BEDTIME (Patient not taking: Reported on 04/27/2023)   [DISCONTINUED] calcitRIOL (ROCALTROL) 0.5 MCG capsule TAKE ONE CAPSULE BY MOUTH DAILY   [DISCONTINUED] calcium acetate (PHOSLO) 667 MG capsule TAKE TWO CAPSULES BY MOUTH THREE TIMES DAILY WITH meals   [DISCONTINUED] hydrALAZINE (APRESOLINE) 50 MG tablet TAKE 1 TABLET BY MOUTH EVERY 8 HOURS   [DISCONTINUED] metoprolol tartrate (LOPRESSOR) 25 MG tablet TAKE 1 TABLET BY MOUTH TWICE DAILY   [DISCONTINUED] rOPINIRole (REQUIP) 0.5 MG tablet Take by mouth.   [DISCONTINUED] sulfamethoxazole-trimethoprim (BACTRIM) 400-80 MG tablet Take by mouth.   No facility-administered encounter medications on file as of 04/27/2023.    Allergies  Allergen Reactions   Ondansetron Other (See Comments)    Caused her more nausea-can take phenergan   Other Other (See Comments)    Burning skin   Amoxicillin Nausea And Vomiting   Tylenol [Acetaminophen] Other (See Comments)    Patient stated she does not take due to her kidney function.   Dilaudid [Hydromorphone] Itching   Morphine And Codeine Itching   Pregabalin Itching and Other (See Comments)    Pertinent ROS per HPI, otherwise unremarkable      Objective:  BP (!) 149/73   Pulse 64   Temp (!) 97.2 F (36.2 C)   Ht 5\' 8"  (1.727 m)   Wt 223 lb 12.8 oz (101.5 kg)   SpO2 100%   BMI 34.03 kg/m    Wt Readings from Last 3 Encounters:  04/27/23 223 lb  12.8 oz (101.5 kg)  01/11/23 222 lb (100.7 kg)  10/05/22 242 lb 9.6 oz (110 kg)  Physical Exam Vitals and nursing note reviewed.  Constitutional:      General: She is not in acute distress.    Appearance: Normal appearance. She is obese. She is not ill-appearing, toxic-appearing or diaphoretic.  HENT:     Head: Normocephalic and atraumatic.     Nose: Nose normal.     Mouth/Throat:     Mouth: Mucous membranes are moist.  Eyes:     Pupils: Pupils are equal, round, and reactive to light.  Cardiovascular:     Rate and Rhythm: Normal rate.  Pulmonary:     Effort: Pulmonary effort is normal.  Musculoskeletal:     Cervical back: Neck supple.  Skin:    General: Skin is warm and dry.     Capillary Refill: Capillary refill takes less than 2 seconds.  Neurological:     General: No focal deficit present.     Mental Status: She is alert and oriented to person, place, and time.  Psychiatric:        Mood and Affect: Mood normal.        Behavior: Behavior normal.        Thought Content: Thought content normal.        Judgment: Judgment normal.       Results for orders placed or performed in visit on 06/29/22  Beta hCG quant (ref lab)   Collection Time: 06/29/22 10:33 AM  Result Value Ref Range   hCG Quant 139 mIU/mL       Pertinent labs & imaging results that were available during my care of the patient were reviewed by me and considered in my medical decision making.  Assessment & Plan:  Kinesha was seen today for letter for service dog.  Diagnoses and all orders for this visit:  Type 1 diabetes mellitus with complications [facility]) Service dog form needed.  Renal transplant recipient Service dog form needed.   Hypertension associated with diabetes [facility]) Service dog form needed.   Mixed diabetic hyperlipidemia associated with type 1 diabetes mellitus (HCC) Service dog form needed.  Status Post Kidney Transplant Initial post-operative period involved medication  adjustments due to intolerance to rejection medications. Currently on monthly infusions of Bella, which are well-tolerated. No longer dialysis-dependent. Recovery has been good, with regular nephrology follow-ups and bi-monthly labs. - Update medical history to reflect renal transplant recipient and remove dialysis dependence - Continue monthly Bella infusions - Continue regular nephrology follow-ups with bi-monthly labs  Diabetes Mellitus Diabetes mellitus managed with insulin. Blood sugars elevated due to prednisone, which is expected to continue until October 13th. Upcoming endocrinology appointment on the 23rd of next month. Blood sugars spike after morning prednisone dose but are regulated for the rest of the day with insulin. - Continue current insulin regimen - Attend endocrinology appointment on the 23rd of next month  Hypertension Previously uncontrolled due to steroid use but now well-controlled with new medications. - Continue current antihypertensive medications  Service Dog Documentation Requests documentation for a service dog. Provided papers but needs a note from her doctor. - Type and print letter for service dog documentation.          Continue all other maintenance medications.  Follow up plan: Return in 8 months (on 12/25/2023), or if symptoms worsen or fail to improve, for Annual Physical.   Continue healthy lifestyle choices, including diet (rich in fruits, vegetables, and lean proteins, and low in salt and simple carbohydrates) and exercise (at least 30 minutes of moderate physical activity daily).  The above assessment and management plan was discussed with the patient. The patient verbalized understanding of and has agreed to the management plan. Patient is aware to call the clinic if they develop any new symptoms or if symptoms persist or worsen. Patient is aware when to return to the clinic for a follow-up visit. Patient educated on when it is appropriate to  go to the emergency department.   Kari Baars, FNP-C [facility] [phone]

## 2023-04-29 DIAGNOSIS — M25511 Pain in right shoulder: Secondary | ICD-10-CM | POA: Diagnosis not present

## 2023-04-29 DIAGNOSIS — M25571 Pain in right ankle and joints of right foot: Secondary | ICD-10-CM | POA: Diagnosis not present

## 2023-04-29 DIAGNOSIS — M25512 Pain in left shoulder: Secondary | ICD-10-CM | POA: Diagnosis not present

## 2023-04-29 DIAGNOSIS — G894 Chronic pain syndrome: Secondary | ICD-10-CM | POA: Diagnosis not present

## 2023-04-29 DIAGNOSIS — M542 Cervicalgia: Secondary | ICD-10-CM | POA: Diagnosis not present

## 2023-05-06 DIAGNOSIS — D84821 Immunodeficiency due to drugs: Secondary | ICD-10-CM | POA: Diagnosis not present

## 2023-05-06 DIAGNOSIS — Z4822 Encounter for aftercare following kidney transplant: Secondary | ICD-10-CM | POA: Diagnosis not present

## 2023-05-06 DIAGNOSIS — I129 Hypertensive chronic kidney disease with stage 1 through stage 4 chronic kidney disease, or unspecified chronic kidney disease: Secondary | ICD-10-CM | POA: Diagnosis not present

## 2023-05-06 DIAGNOSIS — Z5181 Encounter for therapeutic drug level monitoring: Secondary | ICD-10-CM | POA: Diagnosis not present

## 2023-05-06 DIAGNOSIS — I071 Rheumatic tricuspid insufficiency: Secondary | ICD-10-CM | POA: Diagnosis not present

## 2023-05-06 DIAGNOSIS — I340 Nonrheumatic mitral (valve) insufficiency: Secondary | ICD-10-CM | POA: Diagnosis not present

## 2023-05-06 DIAGNOSIS — Z940 Kidney transplant status: Secondary | ICD-10-CM | POA: Diagnosis not present

## 2023-05-06 DIAGNOSIS — I10 Essential (primary) hypertension: Secondary | ICD-10-CM | POA: Diagnosis not present

## 2023-05-06 DIAGNOSIS — Z79621 Long term (current) use of calcineurin inhibitor: Secondary | ICD-10-CM | POA: Diagnosis not present

## 2023-05-06 DIAGNOSIS — T8619 Other complication of kidney transplant: Secondary | ICD-10-CM | POA: Diagnosis not present

## 2023-05-06 DIAGNOSIS — E875 Hyperkalemia: Secondary | ICD-10-CM | POA: Diagnosis not present

## 2023-05-06 DIAGNOSIS — Z794 Long term (current) use of insulin: Secondary | ICD-10-CM | POA: Diagnosis not present

## 2023-05-06 DIAGNOSIS — E8770 Fluid overload, unspecified: Secondary | ICD-10-CM | POA: Diagnosis not present

## 2023-05-06 DIAGNOSIS — Z79899 Other long term (current) drug therapy: Secondary | ICD-10-CM | POA: Diagnosis not present

## 2023-05-06 DIAGNOSIS — E119 Type 2 diabetes mellitus without complications: Secondary | ICD-10-CM | POA: Diagnosis not present

## 2023-05-16 ENCOUNTER — Other Ambulatory Visit: Payer: Self-pay | Admitting: Nurse Practitioner

## 2023-05-17 DIAGNOSIS — Z79899 Other long term (current) drug therapy: Secondary | ICD-10-CM | POA: Diagnosis not present

## 2023-05-17 DIAGNOSIS — G894 Chronic pain syndrome: Secondary | ICD-10-CM | POA: Diagnosis not present

## 2023-05-17 DIAGNOSIS — M25512 Pain in left shoulder: Secondary | ICD-10-CM | POA: Diagnosis not present

## 2023-05-17 DIAGNOSIS — M25511 Pain in right shoulder: Secondary | ICD-10-CM | POA: Diagnosis not present

## 2023-05-17 DIAGNOSIS — Z79891 Long term (current) use of opiate analgesic: Secondary | ICD-10-CM | POA: Diagnosis not present

## 2023-05-17 DIAGNOSIS — M5459 Other low back pain: Secondary | ICD-10-CM | POA: Diagnosis not present

## 2023-05-17 DIAGNOSIS — M25572 Pain in left ankle and joints of left foot: Secondary | ICD-10-CM | POA: Diagnosis not present

## 2023-05-17 DIAGNOSIS — M542 Cervicalgia: Secondary | ICD-10-CM | POA: Diagnosis not present

## 2023-05-17 DIAGNOSIS — M25571 Pain in right ankle and joints of right foot: Secondary | ICD-10-CM | POA: Diagnosis not present

## 2023-05-20 DIAGNOSIS — Z940 Kidney transplant status: Secondary | ICD-10-CM | POA: Diagnosis not present

## 2023-06-01 DIAGNOSIS — M25511 Pain in right shoulder: Secondary | ICD-10-CM | POA: Diagnosis not present

## 2023-06-01 DIAGNOSIS — M25571 Pain in right ankle and joints of right foot: Secondary | ICD-10-CM | POA: Diagnosis not present

## 2023-06-01 DIAGNOSIS — G894 Chronic pain syndrome: Secondary | ICD-10-CM | POA: Diagnosis not present

## 2023-06-01 DIAGNOSIS — M542 Cervicalgia: Secondary | ICD-10-CM | POA: Diagnosis not present

## 2023-06-01 DIAGNOSIS — M25512 Pain in left shoulder: Secondary | ICD-10-CM | POA: Diagnosis not present

## 2023-06-06 DIAGNOSIS — Z13220 Encounter for screening for lipoid disorders: Secondary | ICD-10-CM | POA: Diagnosis not present

## 2023-06-06 DIAGNOSIS — Z940 Kidney transplant status: Secondary | ICD-10-CM | POA: Diagnosis not present

## 2023-06-16 ENCOUNTER — Other Ambulatory Visit: Payer: Self-pay | Admitting: Family Medicine

## 2023-06-16 DIAGNOSIS — E782 Mixed hyperlipidemia: Secondary | ICD-10-CM

## 2023-06-16 DIAGNOSIS — F5101 Primary insomnia: Secondary | ICD-10-CM

## 2023-06-16 DIAGNOSIS — G2581 Restless legs syndrome: Secondary | ICD-10-CM

## 2023-06-20 DIAGNOSIS — Z940 Kidney transplant status: Secondary | ICD-10-CM | POA: Diagnosis not present

## 2023-07-01 DIAGNOSIS — M25571 Pain in right ankle and joints of right foot: Secondary | ICD-10-CM | POA: Diagnosis not present

## 2023-07-01 DIAGNOSIS — G894 Chronic pain syndrome: Secondary | ICD-10-CM | POA: Diagnosis not present

## 2023-07-01 DIAGNOSIS — M25512 Pain in left shoulder: Secondary | ICD-10-CM | POA: Diagnosis not present

## 2023-07-01 DIAGNOSIS — M542 Cervicalgia: Secondary | ICD-10-CM | POA: Diagnosis not present

## 2023-07-01 DIAGNOSIS — M25511 Pain in right shoulder: Secondary | ICD-10-CM | POA: Diagnosis not present

## 2023-07-12 DIAGNOSIS — M542 Cervicalgia: Secondary | ICD-10-CM | POA: Diagnosis not present

## 2023-07-12 DIAGNOSIS — G894 Chronic pain syndrome: Secondary | ICD-10-CM | POA: Diagnosis not present

## 2023-07-12 DIAGNOSIS — M25512 Pain in left shoulder: Secondary | ICD-10-CM | POA: Diagnosis not present

## 2023-07-12 DIAGNOSIS — Z79899 Other long term (current) drug therapy: Secondary | ICD-10-CM | POA: Diagnosis not present

## 2023-07-12 DIAGNOSIS — M5459 Other low back pain: Secondary | ICD-10-CM | POA: Diagnosis not present

## 2023-07-12 DIAGNOSIS — Z79891 Long term (current) use of opiate analgesic: Secondary | ICD-10-CM | POA: Diagnosis not present

## 2023-07-18 DIAGNOSIS — Z940 Kidney transplant status: Secondary | ICD-10-CM | POA: Diagnosis not present

## 2023-07-22 ENCOUNTER — Telehealth: Payer: Self-pay | Admitting: Family Medicine

## 2023-07-22 NOTE — Telephone Encounter (Signed)
 Patient was identified as falling into the [place] Measure - Diabetes.   Patient was: Patient is not currently using our practice.

## 2023-07-27 ENCOUNTER — Telehealth: Payer: Self-pay | Admitting: Family Medicine

## 2023-07-27 NOTE — Telephone Encounter (Signed)
 Verified with lab that the order are in [facility] system. Called pt and got her scheduled for lab appt next week.

## 2023-07-27 NOTE — Telephone Encounter (Signed)
 Copied from CRM [id number]. Topic: General - Other >> Jul 27, 2023 12:03 PM Felizardo Hotter wrote: Reason for CRM: Received call from [facility] per Jan ph: [phone] returning Chelsea's call regarding pt MRN: [id number]. [facility] results have been sent requistions # [id number].

## 2023-08-01 DIAGNOSIS — M25511 Pain in right shoulder: Secondary | ICD-10-CM | POA: Diagnosis not present

## 2023-08-01 DIAGNOSIS — M25571 Pain in right ankle and joints of right foot: Secondary | ICD-10-CM | POA: Diagnosis not present

## 2023-08-01 DIAGNOSIS — M542 Cervicalgia: Secondary | ICD-10-CM | POA: Diagnosis not present

## 2023-08-01 DIAGNOSIS — M25512 Pain in left shoulder: Secondary | ICD-10-CM | POA: Diagnosis not present

## 2023-08-01 DIAGNOSIS — G894 Chronic pain syndrome: Secondary | ICD-10-CM | POA: Diagnosis not present

## 2023-08-02 ENCOUNTER — Other Ambulatory Visit

## 2023-08-02 DIAGNOSIS — Z940 Kidney transplant status: Secondary | ICD-10-CM | POA: Diagnosis not present

## 2023-08-14 DIAGNOSIS — N390 Urinary tract infection, site not specified: Secondary | ICD-10-CM | POA: Diagnosis not present

## 2023-08-14 DIAGNOSIS — R0789 Other chest pain: Secondary | ICD-10-CM | POA: Diagnosis not present

## 2023-08-14 DIAGNOSIS — Z87440 Personal history of urinary (tract) infections: Secondary | ICD-10-CM | POA: Diagnosis not present

## 2023-08-14 DIAGNOSIS — Z87891 Personal history of nicotine dependence: Secondary | ICD-10-CM | POA: Diagnosis not present

## 2023-08-14 DIAGNOSIS — I129 Hypertensive chronic kidney disease with stage 1 through stage 4 chronic kidney disease, or unspecified chronic kidney disease: Secondary | ICD-10-CM | POA: Diagnosis not present

## 2023-08-14 DIAGNOSIS — M549 Dorsalgia, unspecified: Secondary | ICD-10-CM | POA: Diagnosis not present

## 2023-08-14 DIAGNOSIS — R739 Hyperglycemia, unspecified: Secondary | ICD-10-CM | POA: Diagnosis not present

## 2023-08-14 DIAGNOSIS — R10814 Left lower quadrant abdominal tenderness: Secondary | ICD-10-CM | POA: Diagnosis not present

## 2023-08-14 DIAGNOSIS — I4519 Other right bundle-branch block: Secondary | ICD-10-CM | POA: Diagnosis not present

## 2023-08-14 DIAGNOSIS — Z940 Kidney transplant status: Secondary | ICD-10-CM | POA: Diagnosis not present

## 2023-08-14 DIAGNOSIS — Z885 Allergy status to narcotic agent status: Secondary | ICD-10-CM | POA: Diagnosis not present

## 2023-08-14 DIAGNOSIS — Z7952 Long term (current) use of systemic steroids: Secondary | ICD-10-CM | POA: Diagnosis not present

## 2023-08-14 DIAGNOSIS — R231 Pallor: Secondary | ICD-10-CM | POA: Diagnosis not present

## 2023-08-14 DIAGNOSIS — R0682 Tachypnea, not elsewhere classified: Secondary | ICD-10-CM | POA: Diagnosis not present

## 2023-08-14 DIAGNOSIS — N189 Chronic kidney disease, unspecified: Secondary | ICD-10-CM | POA: Diagnosis not present

## 2023-08-14 DIAGNOSIS — Z79621 Long term (current) use of calcineurin inhibitor: Secondary | ICD-10-CM | POA: Diagnosis not present

## 2023-08-14 DIAGNOSIS — R6889 Other general symptoms and signs: Secondary | ICD-10-CM | POA: Diagnosis not present

## 2023-08-14 DIAGNOSIS — R918 Other nonspecific abnormal finding of lung field: Secondary | ICD-10-CM | POA: Diagnosis not present

## 2023-08-14 DIAGNOSIS — R9341 Abnormal radiologic findings on diagnostic imaging of renal pelvis, ureter, or bladder: Secondary | ICD-10-CM | POA: Diagnosis not present

## 2023-08-14 DIAGNOSIS — Z5181 Encounter for therapeutic drug level monitoring: Secondary | ICD-10-CM | POA: Diagnosis not present

## 2023-08-14 DIAGNOSIS — R509 Fever, unspecified: Secondary | ICD-10-CM | POA: Diagnosis not present

## 2023-08-14 DIAGNOSIS — Z452 Encounter for adjustment and management of vascular access device: Secondary | ICD-10-CM | POA: Diagnosis not present

## 2023-08-14 DIAGNOSIS — R9431 Abnormal electrocardiogram [ECG] [EKG]: Secondary | ICD-10-CM | POA: Diagnosis not present

## 2023-08-14 DIAGNOSIS — R1031 Right lower quadrant pain: Secondary | ICD-10-CM | POA: Diagnosis not present

## 2023-08-14 DIAGNOSIS — K8689 Other specified diseases of pancreas: Secondary | ICD-10-CM | POA: Diagnosis not present

## 2023-08-14 DIAGNOSIS — E785 Hyperlipidemia, unspecified: Secondary | ICD-10-CM | POA: Diagnosis not present

## 2023-08-14 DIAGNOSIS — A419 Sepsis, unspecified organism: Secondary | ICD-10-CM | POA: Diagnosis not present

## 2023-08-14 DIAGNOSIS — E1122 Type 2 diabetes mellitus with diabetic chronic kidney disease: Secondary | ICD-10-CM | POA: Diagnosis not present

## 2023-08-14 DIAGNOSIS — Z79899 Other long term (current) drug therapy: Secondary | ICD-10-CM | POA: Diagnosis not present

## 2023-08-14 DIAGNOSIS — Z841 Family history of disorders of kidney and ureter: Secondary | ICD-10-CM | POA: Diagnosis not present

## 2023-08-14 DIAGNOSIS — R10813 Right lower quadrant abdominal tenderness: Secondary | ICD-10-CM | POA: Diagnosis not present

## 2023-08-14 DIAGNOSIS — I959 Hypotension, unspecified: Secondary | ICD-10-CM | POA: Diagnosis not present

## 2023-08-15 DIAGNOSIS — E785 Hyperlipidemia, unspecified: Secondary | ICD-10-CM | POA: Diagnosis not present

## 2023-08-15 DIAGNOSIS — Z841 Family history of disorders of kidney and ureter: Secondary | ICD-10-CM | POA: Diagnosis not present

## 2023-08-15 DIAGNOSIS — Z79621 Long term (current) use of calcineurin inhibitor: Secondary | ICD-10-CM | POA: Diagnosis not present

## 2023-08-15 DIAGNOSIS — E1121 Type 2 diabetes mellitus with diabetic nephropathy: Secondary | ICD-10-CM | POA: Diagnosis not present

## 2023-08-15 DIAGNOSIS — D849 Immunodeficiency, unspecified: Secondary | ICD-10-CM | POA: Diagnosis not present

## 2023-08-15 DIAGNOSIS — I272 Pulmonary hypertension, unspecified: Secondary | ICD-10-CM | POA: Diagnosis not present

## 2023-08-15 DIAGNOSIS — E10610 Type 1 diabetes mellitus with diabetic neuropathic arthropathy: Secondary | ICD-10-CM | POA: Diagnosis not present

## 2023-08-15 DIAGNOSIS — E8779 Other fluid overload: Secondary | ICD-10-CM | POA: Diagnosis not present

## 2023-08-15 DIAGNOSIS — Z452 Encounter for adjustment and management of vascular access device: Secondary | ICD-10-CM | POA: Diagnosis not present

## 2023-08-15 DIAGNOSIS — I868 Varicose veins of other specified sites: Secondary | ICD-10-CM | POA: Diagnosis not present

## 2023-08-15 DIAGNOSIS — R9431 Abnormal electrocardiogram [ECG] [EKG]: Secondary | ICD-10-CM | POA: Diagnosis not present

## 2023-08-15 DIAGNOSIS — N170 Acute kidney failure with tubular necrosis: Secondary | ICD-10-CM | POA: Diagnosis not present

## 2023-08-15 DIAGNOSIS — A419 Sepsis, unspecified organism: Secondary | ICD-10-CM | POA: Diagnosis not present

## 2023-08-15 DIAGNOSIS — I361 Nonrheumatic tricuspid (valve) insufficiency: Secondary | ICD-10-CM | POA: Diagnosis not present

## 2023-08-15 DIAGNOSIS — K8689 Other specified diseases of pancreas: Secondary | ICD-10-CM | POA: Diagnosis not present

## 2023-08-15 DIAGNOSIS — Z79899 Other long term (current) drug therapy: Secondary | ICD-10-CM | POA: Diagnosis not present

## 2023-08-15 DIAGNOSIS — R6521 Severe sepsis with septic shock: Secondary | ICD-10-CM | POA: Diagnosis not present

## 2023-08-15 DIAGNOSIS — R9341 Abnormal radiologic findings on diagnostic imaging of renal pelvis, ureter, or bladder: Secondary | ICD-10-CM | POA: Diagnosis not present

## 2023-08-15 DIAGNOSIS — G2581 Restless legs syndrome: Secondary | ICD-10-CM | POA: Diagnosis not present

## 2023-08-15 DIAGNOSIS — N189 Chronic kidney disease, unspecified: Secondary | ICD-10-CM | POA: Diagnosis not present

## 2023-08-15 DIAGNOSIS — N12 Tubulo-interstitial nephritis, not specified as acute or chronic: Secondary | ICD-10-CM | POA: Diagnosis not present

## 2023-08-15 DIAGNOSIS — D62 Acute posthemorrhagic anemia: Secondary | ICD-10-CM | POA: Diagnosis not present

## 2023-08-15 DIAGNOSIS — D84821 Immunodeficiency due to drugs: Secondary | ICD-10-CM | POA: Diagnosis not present

## 2023-08-15 DIAGNOSIS — I4519 Other right bundle-branch block: Secondary | ICD-10-CM | POA: Diagnosis not present

## 2023-08-15 DIAGNOSIS — I959 Hypotension, unspecified: Secondary | ICD-10-CM | POA: Diagnosis not present

## 2023-08-15 DIAGNOSIS — E1122 Type 2 diabetes mellitus with diabetic chronic kidney disease: Secondary | ICD-10-CM | POA: Diagnosis not present

## 2023-08-15 DIAGNOSIS — N184 Chronic kidney disease, stage 4 (severe): Secondary | ICD-10-CM | POA: Diagnosis not present

## 2023-08-15 DIAGNOSIS — R10813 Right lower quadrant abdominal tenderness: Secondary | ICD-10-CM | POA: Diagnosis not present

## 2023-08-15 DIAGNOSIS — N390 Urinary tract infection, site not specified: Secondary | ICD-10-CM | POA: Diagnosis not present

## 2023-08-15 DIAGNOSIS — Z87440 Personal history of urinary (tract) infections: Secondary | ICD-10-CM | POA: Diagnosis not present

## 2023-08-15 DIAGNOSIS — I517 Cardiomegaly: Secondary | ICD-10-CM | POA: Diagnosis not present

## 2023-08-15 DIAGNOSIS — Z881 Allergy status to other antibiotic agents status: Secondary | ICD-10-CM | POA: Diagnosis not present

## 2023-08-15 DIAGNOSIS — I071 Rheumatic tricuspid insufficiency: Secondary | ICD-10-CM | POA: Diagnosis not present

## 2023-08-15 DIAGNOSIS — A4150 Gram-negative sepsis, unspecified: Secondary | ICD-10-CM | POA: Diagnosis not present

## 2023-08-15 DIAGNOSIS — B259 Cytomegaloviral disease, unspecified: Secondary | ICD-10-CM | POA: Diagnosis not present

## 2023-08-15 DIAGNOSIS — Z794 Long term (current) use of insulin: Secondary | ICD-10-CM | POA: Diagnosis not present

## 2023-08-15 DIAGNOSIS — R0682 Tachypnea, not elsewhere classified: Secondary | ICD-10-CM | POA: Diagnosis not present

## 2023-08-15 DIAGNOSIS — E10649 Type 1 diabetes mellitus with hypoglycemia without coma: Secondary | ICD-10-CM | POA: Diagnosis not present

## 2023-08-15 DIAGNOSIS — I129 Hypertensive chronic kidney disease with stage 1 through stage 4 chronic kidney disease, or unspecified chronic kidney disease: Secondary | ICD-10-CM | POA: Diagnosis not present

## 2023-08-15 DIAGNOSIS — Z940 Kidney transplant status: Secondary | ICD-10-CM | POA: Diagnosis not present

## 2023-08-15 DIAGNOSIS — N179 Acute kidney failure, unspecified: Secondary | ICD-10-CM | POA: Diagnosis not present

## 2023-08-15 DIAGNOSIS — N289 Disorder of kidney and ureter, unspecified: Secondary | ICD-10-CM | POA: Diagnosis not present

## 2023-08-15 DIAGNOSIS — T8613 Kidney transplant infection: Secondary | ICD-10-CM | POA: Diagnosis not present

## 2023-08-15 DIAGNOSIS — Z87891 Personal history of nicotine dependence: Secondary | ICD-10-CM | POA: Diagnosis not present

## 2023-08-15 DIAGNOSIS — Z885 Allergy status to narcotic agent status: Secondary | ICD-10-CM | POA: Diagnosis not present

## 2023-08-15 DIAGNOSIS — Z7952 Long term (current) use of systemic steroids: Secondary | ICD-10-CM | POA: Diagnosis not present

## 2023-08-15 DIAGNOSIS — Z5181 Encounter for therapeutic drug level monitoring: Secondary | ICD-10-CM | POA: Diagnosis not present

## 2023-08-15 DIAGNOSIS — R10814 Left lower quadrant abdominal tenderness: Secondary | ICD-10-CM | POA: Diagnosis not present

## 2023-08-15 DIAGNOSIS — I10 Essential (primary) hypertension: Secondary | ICD-10-CM | POA: Diagnosis not present

## 2023-08-15 DIAGNOSIS — Z888 Allergy status to other drugs, medicaments and biological substances status: Secondary | ICD-10-CM | POA: Diagnosis not present

## 2023-08-23 ENCOUNTER — Telehealth: Payer: Self-pay | Admitting: *Deleted

## 2023-08-23 NOTE — Transitions of Care (Post Inpatient/ED Visit) (Signed)
   08/23/2023  Name: Sally Porter MRN: [id number] DOB: 1980-12-25  Today's TOC FU Call Status: Today's TOC FU Call Status:: Unsuccessful Call (1st Attempt) Unsuccessful Call (1st Attempt) Date: 08/23/23  Attempted to reach the patient regarding the most recent Inpatient/ED visit.  Follow Up Plan: Additional outreach attempts will be made to reach the patient to complete the Transitions of Care (Post Inpatient/ED visit) call.   Mliss Creed [facility], BSN RN Care Manager/ Transition of Care [place]/ [facility] [phone]

## 2023-08-24 ENCOUNTER — Telehealth: Payer: Self-pay | Admitting: *Deleted

## 2023-08-24 NOTE — Transitions of Care (Post Inpatient/ED Visit) (Signed)
   08/24/2023  Name: Sally Porter MRN: [id number] DOB: [birth year]  Today's TOC FU Call Status: Today's TOC FU Call Status:: Unsuccessful Call (2nd Attempt) Unsuccessful Call (2nd Attempt) Date: 08/24/23  Attempted to reach the patient regarding the most recent Inpatient/ED visit.  Follow Up Plan: Additional outreach attempts will be made to reach the patient to complete the Transitions of Care (Post Inpatient/ED visit) call.   Mliss Creed [facility], BSN RN Care Manager/ Transition of Care [place]/ [facility] [phone]

## 2023-08-25 ENCOUNTER — Telehealth: Payer: Self-pay | Admitting: *Deleted

## 2023-08-25 NOTE — Transitions of Care (Post Inpatient/ED Visit) (Signed)
   08/25/2023  Name: Sally Porter MRN: [id number] DOB: 03-05-1980  Today's TOC FU Call Status: Today's TOC FU Call Status:: Unsuccessful Call (3rd Attempt) Unsuccessful Call (3rd Attempt) Date: 08/25/23  Attempted to reach the patient regarding the most recent Inpatient/ED visit.  Follow Up Plan: No further outreach attempts will be made at this time. We have been unable to contact the patient.  Andrea Dimes RN, BSN [place]  [facility] RN Care Manager [phone]

## 2023-08-31 DIAGNOSIS — N179 Acute kidney failure, unspecified: Secondary | ICD-10-CM | POA: Diagnosis not present

## 2023-08-31 DIAGNOSIS — M542 Cervicalgia: Secondary | ICD-10-CM | POA: Diagnosis not present

## 2023-08-31 DIAGNOSIS — Z79899 Other long term (current) drug therapy: Secondary | ICD-10-CM | POA: Diagnosis not present

## 2023-08-31 DIAGNOSIS — G894 Chronic pain syndrome: Secondary | ICD-10-CM | POA: Diagnosis not present

## 2023-08-31 DIAGNOSIS — M25511 Pain in right shoulder: Secondary | ICD-10-CM | POA: Diagnosis not present

## 2023-08-31 DIAGNOSIS — N390 Urinary tract infection, site not specified: Secondary | ICD-10-CM | POA: Diagnosis not present

## 2023-08-31 DIAGNOSIS — M25512 Pain in left shoulder: Secondary | ICD-10-CM | POA: Diagnosis not present

## 2023-08-31 DIAGNOSIS — Z940 Kidney transplant status: Secondary | ICD-10-CM | POA: Diagnosis not present

## 2023-08-31 DIAGNOSIS — M25571 Pain in right ankle and joints of right foot: Secondary | ICD-10-CM | POA: Diagnosis not present

## 2023-08-31 DIAGNOSIS — Z13220 Encounter for screening for lipoid disorders: Secondary | ICD-10-CM | POA: Diagnosis not present

## 2023-08-31 DIAGNOSIS — A419 Sepsis, unspecified organism: Secondary | ICD-10-CM | POA: Diagnosis not present

## 2023-08-31 DIAGNOSIS — E1165 Type 2 diabetes mellitus with hyperglycemia: Secondary | ICD-10-CM | POA: Diagnosis not present

## 2023-09-19 DIAGNOSIS — M25571 Pain in right ankle and joints of right foot: Secondary | ICD-10-CM | POA: Diagnosis not present

## 2023-09-19 DIAGNOSIS — M25572 Pain in left ankle and joints of left foot: Secondary | ICD-10-CM | POA: Diagnosis not present

## 2023-09-19 DIAGNOSIS — M542 Cervicalgia: Secondary | ICD-10-CM | POA: Diagnosis not present

## 2023-09-19 DIAGNOSIS — G894 Chronic pain syndrome: Secondary | ICD-10-CM | POA: Diagnosis not present

## 2023-09-19 DIAGNOSIS — Z79891 Long term (current) use of opiate analgesic: Secondary | ICD-10-CM | POA: Diagnosis not present

## 2023-09-19 DIAGNOSIS — Z79899 Other long term (current) drug therapy: Secondary | ICD-10-CM | POA: Diagnosis not present

## 2023-09-19 DIAGNOSIS — M25512 Pain in left shoulder: Secondary | ICD-10-CM | POA: Diagnosis not present

## 2023-09-19 DIAGNOSIS — M5459 Other low back pain: Secondary | ICD-10-CM | POA: Diagnosis not present

## 2023-09-19 DIAGNOSIS — M25511 Pain in right shoulder: Secondary | ICD-10-CM | POA: Diagnosis not present

## 2023-09-29 DIAGNOSIS — R600 Localized edema: Secondary | ICD-10-CM | POA: Diagnosis not present

## 2023-09-29 DIAGNOSIS — Z48298 Encounter for aftercare following other organ transplant: Secondary | ICD-10-CM | POA: Diagnosis not present

## 2023-09-29 DIAGNOSIS — Z940 Kidney transplant status: Secondary | ICD-10-CM | POA: Diagnosis not present

## 2023-09-29 DIAGNOSIS — Z4822 Encounter for aftercare following kidney transplant: Secondary | ICD-10-CM | POA: Diagnosis not present

## 2023-09-29 DIAGNOSIS — Z79899 Other long term (current) drug therapy: Secondary | ICD-10-CM | POA: Diagnosis not present

## 2023-10-01 DIAGNOSIS — G894 Chronic pain syndrome: Secondary | ICD-10-CM | POA: Diagnosis not present

## 2023-10-01 DIAGNOSIS — M25512 Pain in left shoulder: Secondary | ICD-10-CM | POA: Diagnosis not present

## 2023-10-01 DIAGNOSIS — M25571 Pain in right ankle and joints of right foot: Secondary | ICD-10-CM | POA: Diagnosis not present

## 2023-10-01 DIAGNOSIS — M542 Cervicalgia: Secondary | ICD-10-CM | POA: Diagnosis not present

## 2023-10-01 DIAGNOSIS — M25511 Pain in right shoulder: Secondary | ICD-10-CM | POA: Diagnosis not present

## 2023-10-27 DIAGNOSIS — E119 Type 2 diabetes mellitus without complications: Secondary | ICD-10-CM | POA: Diagnosis not present

## 2023-10-27 DIAGNOSIS — I10 Essential (primary) hypertension: Secondary | ICD-10-CM | POA: Diagnosis not present

## 2023-10-27 DIAGNOSIS — E785 Hyperlipidemia, unspecified: Secondary | ICD-10-CM | POA: Diagnosis not present

## 2023-10-27 DIAGNOSIS — Z940 Kidney transplant status: Secondary | ICD-10-CM | POA: Diagnosis not present

## 2023-10-27 DIAGNOSIS — E878 Other disorders of electrolyte and fluid balance, not elsewhere classified: Secondary | ICD-10-CM | POA: Diagnosis not present

## 2023-10-27 DIAGNOSIS — D849 Immunodeficiency, unspecified: Secondary | ICD-10-CM | POA: Diagnosis not present

## 2023-10-27 DIAGNOSIS — D649 Anemia, unspecified: Secondary | ICD-10-CM | POA: Diagnosis not present

## 2023-11-01 DIAGNOSIS — M25571 Pain in right ankle and joints of right foot: Secondary | ICD-10-CM | POA: Diagnosis not present

## 2023-11-01 DIAGNOSIS — M542 Cervicalgia: Secondary | ICD-10-CM | POA: Diagnosis not present

## 2023-11-01 DIAGNOSIS — M25511 Pain in right shoulder: Secondary | ICD-10-CM | POA: Diagnosis not present

## 2023-11-01 DIAGNOSIS — M25512 Pain in left shoulder: Secondary | ICD-10-CM | POA: Diagnosis not present

## 2023-11-01 DIAGNOSIS — G894 Chronic pain syndrome: Secondary | ICD-10-CM | POA: Diagnosis not present

## 2023-11-07 ENCOUNTER — Other Ambulatory Visit (INDEPENDENT_AMBULATORY_CARE_PROVIDER_SITE_OTHER): Admitting: Pharmacist

## 2023-11-07 DIAGNOSIS — E108 Type 1 diabetes mellitus with unspecified complications: Secondary | ICD-10-CM

## 2023-11-07 NOTE — Progress Notes (Signed)
 Pharmacy Quality Measure Review  This patient is appearing on a report for being at risk of failing the Glycemic Status Assessment in Diabetes measure this calendar year.    Last documented A1c 7.7% on 03/09/23 at [facility].   A1c controlled on last check, upcoming PCP visit in October. No action needed at this time.   Cat[facility], PharmD, [facility] Clinical Pharmacist [phone]

## 2023-11-14 DIAGNOSIS — G4700 Insomnia, unspecified: Secondary | ICD-10-CM | POA: Diagnosis not present

## 2023-11-14 DIAGNOSIS — M5459 Other low back pain: Secondary | ICD-10-CM | POA: Diagnosis not present

## 2023-11-14 DIAGNOSIS — M25572 Pain in left ankle and joints of left foot: Secondary | ICD-10-CM | POA: Diagnosis not present

## 2023-11-14 DIAGNOSIS — M25571 Pain in right ankle and joints of right foot: Secondary | ICD-10-CM | POA: Diagnosis not present

## 2023-11-14 DIAGNOSIS — M25511 Pain in right shoulder: Secondary | ICD-10-CM | POA: Diagnosis not present

## 2023-11-14 DIAGNOSIS — Z79899 Other long term (current) drug therapy: Secondary | ICD-10-CM | POA: Diagnosis not present

## 2023-11-14 DIAGNOSIS — M25512 Pain in left shoulder: Secondary | ICD-10-CM | POA: Diagnosis not present

## 2023-11-14 DIAGNOSIS — M542 Cervicalgia: Secondary | ICD-10-CM | POA: Diagnosis not present

## 2023-11-28 DIAGNOSIS — Z13220 Encounter for screening for lipoid disorders: Secondary | ICD-10-CM | POA: Diagnosis not present

## 2023-11-28 DIAGNOSIS — Z940 Kidney transplant status: Secondary | ICD-10-CM | POA: Diagnosis not present

## 2023-12-01 DIAGNOSIS — M25571 Pain in right ankle and joints of right foot: Secondary | ICD-10-CM | POA: Diagnosis not present

## 2023-12-01 DIAGNOSIS — G894 Chronic pain syndrome: Secondary | ICD-10-CM | POA: Diagnosis not present

## 2023-12-01 DIAGNOSIS — M542 Cervicalgia: Secondary | ICD-10-CM | POA: Diagnosis not present

## 2023-12-01 DIAGNOSIS — M25511 Pain in right shoulder: Secondary | ICD-10-CM | POA: Diagnosis not present

## 2023-12-01 DIAGNOSIS — M25512 Pain in left shoulder: Secondary | ICD-10-CM | POA: Diagnosis not present

## 2023-12-12 ENCOUNTER — Other Ambulatory Visit: Payer: Self-pay | Admitting: Family Medicine

## 2023-12-12 DIAGNOSIS — E1022 Type 1 diabetes mellitus with diabetic chronic kidney disease: Secondary | ICD-10-CM

## 2023-12-12 NOTE — Telephone Encounter (Signed)
 Copied from CRM [id number]. Topic: Clinical - Medication Refill >> Dec 12, 2023  9:10 AM Sally Porter wrote: Medication: insulin  glargine (LANTUS  SOLOSTAR) 100 UNIT/ML Solostar Pen [[id number]  Has the patient contacted their pharmacy? Yes (Agent: If no, request that the patient contact the pharmacy for the refill. If patient does not wish to contact the pharmacy document the reason why and proceed with request.) (Agent: If yes, when and what did the pharmacy advise?)  This is the patient's preferred pharmacy:  [facility] - [place], [place] - [address] [place] [address] Phone: [phone] Fax: [phone]  Is this the correct pharmacy for this prescription? Yes If no, delete pharmacy and type the correct one.    Is the patient out of the medication? Yes  Has the patient been seen for an appointment in the last year OR does the patient have an upcoming appointment? Yes  Can we respond through MyChart? No  Agent: Please be advised that Rx refills may take up to 3 business days. We ask that you follow-up with your pharmacy.

## 2023-12-14 ENCOUNTER — Other Ambulatory Visit: Payer: Self-pay | Admitting: Family Medicine

## 2023-12-14 MED ORDER — LANTUS SOLOSTAR 100 UNIT/ML ~~LOC~~ SOPN: 30.0000 [IU] | PEN_INJECTOR | Freq: Every day | SUBCUTANEOUS | 0 refills | Status: AC

## 2023-12-20 DIAGNOSIS — Z452 Encounter for adjustment and management of vascular access device: Secondary | ICD-10-CM | POA: Diagnosis not present

## 2023-12-20 DIAGNOSIS — R509 Fever, unspecified: Secondary | ICD-10-CM | POA: Diagnosis not present

## 2023-12-20 DIAGNOSIS — N2889 Other specified disorders of kidney and ureter: Secondary | ICD-10-CM | POA: Diagnosis not present

## 2023-12-20 DIAGNOSIS — R531 Weakness: Secondary | ICD-10-CM | POA: Diagnosis not present

## 2023-12-21 DIAGNOSIS — E1165 Type 2 diabetes mellitus with hyperglycemia: Secondary | ICD-10-CM | POA: Diagnosis not present

## 2023-12-21 DIAGNOSIS — D509 Iron deficiency anemia, unspecified: Secondary | ICD-10-CM | POA: Diagnosis not present

## 2023-12-21 DIAGNOSIS — T8619 Other complication of kidney transplant: Secondary | ICD-10-CM | POA: Diagnosis not present

## 2023-12-21 DIAGNOSIS — N390 Urinary tract infection, site not specified: Secondary | ICD-10-CM | POA: Diagnosis not present

## 2023-12-21 DIAGNOSIS — E10610 Type 1 diabetes mellitus with diabetic neuropathic arthropathy: Secondary | ICD-10-CM | POA: Diagnosis not present

## 2023-12-21 DIAGNOSIS — N179 Acute kidney failure, unspecified: Secondary | ICD-10-CM | POA: Diagnosis not present

## 2023-12-21 DIAGNOSIS — E1022 Type 1 diabetes mellitus with diabetic chronic kidney disease: Secondary | ICD-10-CM | POA: Diagnosis not present

## 2023-12-21 DIAGNOSIS — J811 Chronic pulmonary edema: Secondary | ICD-10-CM | POA: Diagnosis not present

## 2023-12-21 DIAGNOSIS — Z7960 Long term (current) use of unspecified immunomodulators and immunosuppressants: Secondary | ICD-10-CM | POA: Diagnosis not present

## 2023-12-21 DIAGNOSIS — Q2112 Patent foramen ovale: Secondary | ICD-10-CM | POA: Diagnosis not present

## 2023-12-21 DIAGNOSIS — D84821 Immunodeficiency due to drugs: Secondary | ICD-10-CM | POA: Diagnosis not present

## 2023-12-21 DIAGNOSIS — I361 Nonrheumatic tricuspid (valve) insufficiency: Secondary | ICD-10-CM | POA: Diagnosis not present

## 2023-12-21 DIAGNOSIS — I3139 Other pericardial effusion (noninflammatory): Secondary | ICD-10-CM | POA: Diagnosis not present

## 2023-12-21 DIAGNOSIS — D62 Acute posthemorrhagic anemia: Secondary | ICD-10-CM | POA: Diagnosis not present

## 2023-12-21 DIAGNOSIS — I10 Essential (primary) hypertension: Secondary | ICD-10-CM | POA: Diagnosis not present

## 2023-12-21 DIAGNOSIS — Z4822 Encounter for aftercare following kidney transplant: Secondary | ICD-10-CM | POA: Diagnosis not present

## 2023-12-21 DIAGNOSIS — D849 Immunodeficiency, unspecified: Secondary | ICD-10-CM | POA: Diagnosis not present

## 2023-12-21 DIAGNOSIS — E871 Hypo-osmolality and hyponatremia: Secondary | ICD-10-CM | POA: Diagnosis not present

## 2023-12-21 DIAGNOSIS — D631 Anemia in chronic kidney disease: Secondary | ICD-10-CM | POA: Diagnosis not present

## 2023-12-21 DIAGNOSIS — Z940 Kidney transplant status: Secondary | ICD-10-CM | POA: Diagnosis not present

## 2023-12-21 DIAGNOSIS — I129 Hypertensive chronic kidney disease with stage 1 through stage 4 chronic kidney disease, or unspecified chronic kidney disease: Secondary | ICD-10-CM | POA: Diagnosis not present

## 2023-12-21 DIAGNOSIS — I272 Pulmonary hypertension, unspecified: Secondary | ICD-10-CM | POA: Diagnosis not present

## 2023-12-21 DIAGNOSIS — N184 Chronic kidney disease, stage 4 (severe): Secondary | ICD-10-CM | POA: Diagnosis not present

## 2023-12-21 DIAGNOSIS — I071 Rheumatic tricuspid insufficiency: Secondary | ICD-10-CM | POA: Diagnosis not present

## 2023-12-21 DIAGNOSIS — Z794 Long term (current) use of insulin: Secondary | ICD-10-CM | POA: Diagnosis not present

## 2023-12-21 DIAGNOSIS — G2581 Restless legs syndrome: Secondary | ICD-10-CM | POA: Diagnosis not present

## 2023-12-26 ENCOUNTER — Telehealth: Payer: Self-pay | Admitting: *Deleted

## 2023-12-26 DIAGNOSIS — R0600 Dyspnea, unspecified: Secondary | ICD-10-CM | POA: Diagnosis not present

## 2023-12-26 NOTE — Transitions of Care (Post Inpatient/ED Visit) (Signed)
   12/26/2023  Name: Sally Porter MRN: [id number] DOB: [birth year]  Today's TOC FU Call Status: Today's TOC FU Call Status:: Unsuccessful Call (1st Attempt) Unsuccessful Call (1st Attempt) Date: 12/26/23  Attempted to reach the patient regarding the most recent Inpatient/ED visit.  Follow Up Plan: Additional outreach attempts will be made to reach the patient to complete the Transitions of Care (Post Inpatient/ED visit) call.   Mliss Creed [facility], BSN RN Care Manager/ Transition of Care [place]/ [facility] [phone]

## 2023-12-27 ENCOUNTER — Telehealth: Payer: Self-pay | Admitting: *Deleted

## 2023-12-27 ENCOUNTER — Encounter: Payer: 59 | Admitting: Family Medicine

## 2023-12-27 NOTE — Transitions of Care (Post Inpatient/ED Visit) (Signed)
   12/27/2023  Name: Sally Porter MRN: [id number] DOB: 12/19/1980  Today's TOC FU Call Status: Today's TOC FU Call Status:: Unsuccessful Call (2nd Attempt) Unsuccessful Call (2nd Attempt) Date: 12/27/23  Attempted to reach the patient regarding the most recent Inpatient/ED visit.  Follow Up Plan: Additional outreach attempts will be made to reach the patient to complete the Transitions of Care (Post Inpatient/ED visit) call.   Mliss Creed [facility], BSN RN Care Manager/ Transition of Care / [facility] [phone]

## 2023-12-28 ENCOUNTER — Telehealth: Payer: Self-pay | Admitting: *Deleted

## 2023-12-28 NOTE — Transitions of Care (Post Inpatient/ED Visit) (Signed)
   12/28/2023  Name: Sally Porter MRN: [id number] DOB: 02/23/1981  Today's TOC FU Call Status: Today's TOC FU Call Status:: Unsuccessful Call (3rd Attempt) Unsuccessful Call (3rd Attempt) Date: 12/28/23  Attempted to reach the patient regarding the most recent Inpatient/ED visit.  Follow Up Plan: No further outreach attempts will be made at this time. We have been unable to contact the patient.  Mliss Creed [facility], BSN RN Care Manager/ Transition of Care / [facility] [phone]

## 2024-01-04 ENCOUNTER — Encounter (INDEPENDENT_AMBULATORY_CARE_PROVIDER_SITE_OTHER): Payer: Self-pay | Admitting: Gastroenterology

## 2024-01-09 ENCOUNTER — Other Ambulatory Visit: Payer: Self-pay | Admitting: *Deleted

## 2024-01-09 DIAGNOSIS — G2581 Restless legs syndrome: Secondary | ICD-10-CM

## 2024-01-09 DIAGNOSIS — F5101 Primary insomnia: Secondary | ICD-10-CM

## 2024-01-09 DIAGNOSIS — E782 Mixed hyperlipidemia: Secondary | ICD-10-CM

## 2024-01-10 ENCOUNTER — Other Ambulatory Visit: Payer: Self-pay | Admitting: Family Medicine

## 2024-01-10 DIAGNOSIS — E1022 Type 1 diabetes mellitus with diabetic chronic kidney disease: Secondary | ICD-10-CM

## 2024-01-12 ENCOUNTER — Ambulatory Visit: Payer: 59

## 2024-01-12 VITALS — BP 149/73 | HR 64 | Ht 65.0 in | Wt 223.0 lb

## 2024-01-12 DIAGNOSIS — Z0000 Encounter for general adult medical examination without abnormal findings: Secondary | ICD-10-CM

## 2024-01-12 NOTE — Progress Notes (Signed)
 Chief Complaint  Patient presents with   Medicare Wellness     Subjective:   Sally Porter is a 43 y.o. female who presents for a Medicare Annual Wellness Visit.  Allergies (verified) Ondansetron , Other, Amoxicillin , Tylenol  [acetaminophen ], Dilaudid  [hydromorphone ], Morphine and codeine, and Pregabalin   History: Past Medical History:  Diagnosis Date   Anemia    Anxiety    Depression    Diabetes mellitus without complication (HCC)    Reported history of DM type 1   ESRD (end stage renal disease) (HCC)    short term hemodialysis 06/2017; resumed HD 08/15/21   Glaucoma    Hyperlipidemia    Hypertension    Past Surgical History:  Procedure Laterality Date   ANKLE SURGERY Right    AV FISTULA PLACEMENT Left 08/19/2021   Procedure: Creation of Brachial Cephalic LEFT ARM ARTERIOVENOUS (AV) FISTULA;  Surgeon: Magda Debby SAILOR, MD;  Location: [facility] OR;  Service: Vascular;  Laterality: Left;  PERIPHERAL NERVE BLOCK   CESAREAN SECTION  2009   FISTULA SUPERFICIALIZATION Left 11/19/2021   Procedure: LEFT ARM ARTERIOVENOUS FISTULA SUPERFICIALIZATION AND SIDE BRANCH LIGATION;  Surgeon: Magda Debby SAILOR, MD;  Location: [facility] OR;  Service: Vascular;  Laterality: Left;  PERIPHERAL NERVE BLOCK   FOOT SURGERY Left    IR FLUORO GUIDE CV LINE RIGHT  08/15/2021   IR US  GUIDE VASC ACCESS RIGHT  08/15/2021   kidney transplant     REMOVAL OF A DIALYSIS CATHETER N/A 01/26/2022   Procedure: MINOR REMOVAL OF A DIALYSIS CATHETER;  Surgeon: Oris Krystal FALCON, MD;  Location: [facility] ORS;  Service: Vascular;  Laterality: N/A;   Family History  Problem Relation Age of Onset   Arthritis Father    Alcohol abuse Father    Kidney disease Brother    Diabetes Daughter        Type 2   Asthma Daughter    Social History   Occupational History   Not on file  Tobacco Use   Smoking status: Former    Current packs/day: 0.00    Average packs/day: 0.5 packs/day for 3.0 years (1.5 ttl pk-yrs)    Types: Cigarettes     Start date: 2010    Quit date: 2013    Years since quitting: 12.8   Smokeless tobacco: Never  Vaping Use   Vaping status: Never Used  Substance and Sexual Activity   Alcohol use: Not Currently   Drug use: Never   Sexual activity: Not Currently    Birth control/protection: None   Tobacco Counseling Counseling given: Yes  SDOH Screenings   Food Insecurity: No Food Insecurity (01/12/2024)  Housing: Unknown (01/12/2024)  Transportation Needs: No Transportation Needs (01/12/2024)  Utilities: Not At Risk (01/12/2024)  Alcohol Screen: Low Risk  (01/11/2023)  Depression (PHQ2-9): Low Risk  (01/12/2024)  Financial Resource Strain: Low Risk  (01/11/2023)  Physical Activity: Insufficiently Active (01/12/2024)  Social Connections: Moderately Integrated (01/12/2024)  Stress: Stress Concern Present (01/12/2024)  Tobacco Use: Medium Risk (01/12/2024)  Health Literacy: Adequate Health Literacy (01/12/2024)   See flowsheets for full screening details  Depression Screen PHQ 2 & 9 Depression Scale- Over the past 2 weeks, how often have you been bothered by any of the following problems? Little interest or pleasure in doing things: 0 Feeling down, depressed, or hopeless (PHQ Adolescent also includes...irritable): 0 PHQ-2 Total Score: 0     Goals Addressed               This Visit's Progress  Patient Stated (pt-stated)   On track      I just want to do everything I can to get my kidney transplant so I can get back to regular living       Visit info / Clinical Intake: Medicare Wellness Visit Type:: Subsequent Annual Wellness Visit Persons participating in visit:: patient Medicare Wellness Visit Mode:: Telephone If telephone:: video declined Because this visit was a virtual/telehealth visit:: vitals recorded from last visit If Telephone or Video please confirm:: I connected with the patient using audio enabled telemedicine application and verified that I am speaking with the  correct person using two identifiers Patient Location:: home Provider Location:: home office Information given by:: patient Interpreter Needed?: No Pre-visit prep was completed: yes AWV questionnaire completed by patient prior to visit?: no Living arrangements:: lives with spouse/significant other Patient's Overall Health Status Rating: good Typical amount of pain: some (back pain/leg pain) Does pain affect daily life?: no Are you currently prescribed opioids?: (!) yes  Dietary Habits and Nutritional Risks How many meals a day?: 3 Eats fruit and vegetables daily?: yes Most meals are obtained by: preparing own meals In the last 2 weeks, have you had any of the following?: (!) nausea, vomiting, diarrhea Diabetic:: (!) yes Any non-healing wounds?: (!) yes How often do you check your BS?: continuous glucose monitor Would you like to be referred to a Nutritionist or for Diabetic Management? : no  Functional Status Activities of Daily Living (to include ambulation/medication): Independent Ambulation: Independent with device- listed below Home Assistive Devices/Equipment: Johna Finder (specify Type); Wheelchair (pt use PRN) Medication Administration: Independent Home Management: Independent Manage your own finances?: yes Primary transportation is: family/friends Concerns about hearing?: no  Fall Screening Falls in the past year?: 0 Number of falls in past year: 0 Was there an injury with Fall?: 0 Fall Risk Category Calculator: 0 Patient Fall Risk Level: Low Fall Risk  Fall Risk Patient at Risk for Falls Due to: No Fall Risks Fall risk Follow up: Falls evaluation completed; Education provided  Home and Transportation Safety: All rugs have non-skid backing?: yes All stairs or steps have railings?: yes Grab bars in the bathtub or shower?: yes Have non-skid surface in bathtub or shower?: (!) no Good home lighting?: yes Regular seat belt use?: yes Hospital stays in the last  year:: (!) no; yes How many hospital stays:: other Reason: had kideny transplant surgery  Cognitive Assessment Difficulty concentrating, remembering, or making decisions? : no Will 6CIT or Mini Cog be Completed: yes What year is it?: 0 points What month is it?: 0 points Give patient an address phrase to remember (5 components): [address] [place], [place] About what time is it?: 0 points Count backwards from 20 to 1: 0 points Say the months of the year in reverse: 0 points Repeat the address phrase from earlier: 0 points 6 CIT Score: 0 points  Advance Directives (For Healthcare) Does Patient Have a Medical Advance Directive?: No Would patient like information on creating a medical advance directive?: No - Patient declined  Reviewed/Updated  Reviewed/Updated: Reviewed All (Medical, Surgical, Family, Medications, Allergies, Care Teams, Patient Goals); Medical History; Surgical History; Family History; Medications; Allergies; Care Teams; Patient Goals        Objective:    Today's Vitals   01/12/24 0921  BP: (!) 149/73  Pulse: 64  Weight: 223 lb (101.2 kg)  Height: 5' 5 (1.651 m)   Body mass index is 37.11 kg/m.  Current Medications (verified) Outpatient Encounter Medications  as of 01/12/2024  Medication Sig   acetaminophen  (TYLENOL ) 325 MG tablet Take 650 mg by mouth every 6 (six) hours as needed.   albuterol  (VENTOLIN  HFA) 108 (90 Base) MCG/ACT inhaler Inhale 2 puffs into the lungs every 6 (six) hours as needed for wheezing or shortness of breath.   atorvastatin  (LIPITOR) 20 MG tablet TAKE 1 TABLET BY MOUTH DAILY AT 6PM   Continuous Glucose Sensor (DEXCOM G7 SENSOR) MISC apply TO ARM EVERY 10 DAYS TO test blood sugar continuously   ferrous sulfate  325 (65 FE) MG tablet Take 325 mg by mouth daily with breakfast.   ferrous sulfate  ER (SLOW FE) 142 (45 Fe) MG TBCR tablet Take by mouth.   fluticasone  (FLONASE ) 50 MCG/ACT nasal spray place 2 SPRAYS into BOTH nostrils DAILY    insulin  glargine (LANTUS  SOLOSTAR) 100 UNIT/ML Solostar Pen Inject 30 Units into the skin at bedtime.   insulin  lispro (HUMALOG  KWIKPEN) 100 UNIT/ML KwikPen Inject 0-15 Units into the skin 3 (three) times daily. Sliding scale   Insulin  Pen Needle 32G X 4 MM MISC Use with insulin  TID Dx E11.65   LASIX 40 MG tablet Take 40 mg by mouth daily.   lidocaine  (XYLOCAINE ) 5 % ointment Apply 1 Application topically 2 (two) times daily as needed for moderate pain.   naloxone  (NARCAN ) nasal spray 4 mg/0.1 mL Place 0.4 mg into the nose once.   nortriptyline  (PAMELOR ) 25 MG capsule TAKE ONE CAPSULE BY MOUTH TWICE DAILY   Oxycodone  HCl 10 MG TABS Take 10 mg by mouth every 6 (six) hours.   polyethylene glycol (MIRALAX  / GLYCOLAX ) 17 g packet Take 17 g by mouth daily. (Patient taking differently: Take 17 g by mouth daily as needed for moderate constipation or mild constipation.)   predniSONE  (DELTASONE ) 5 MG tablet Take 5 mg by mouth daily.   rOPINIRole  (REQUIP ) 0.5 MG tablet TAKE 2 TABLETS BY MOUTH AT BEDTIME   senna-docusate (SENOKOT-S) 8.6-50 MG tablet Take 1 tablet by mouth 2 (two) times daily. (Patient taking differently: Take 1 tablet by mouth daily as needed for moderate constipation or mild constipation.)   senna-docusate (SENOKOT-S) 8.6-50 MG tablet Take by mouth.   Sodium Zirconium Cyclosilicate (LOKELMA PO) Take by mouth.   tacrolimus  ER (ENVARSUS  XR) 1 MG TB24 Take 1 mg by mouth daily before breakfast. Pt taking tablets 2 daily   zinc gluconate 50 MG tablet Take 50 mg by mouth daily.   methocarbamol (ROBAXIN) 500 MG tablet Take by mouth. (Patient not taking: Reported on 01/12/2024)   tacrolimus  (PROGRAF ) 1 MG capsule Take by mouth. (Patient not taking: Reported on 01/12/2024)   No facility-administered encounter medications on file as of 01/12/2024.   Hearing/Vision screen Hearing Screening - Comments:: Pt denies hearing dif Vision Screening - Comments:: Pt wear contacts/glasses/pt [facility] [place] in [place], [place]/last ov 2024 Immunizations and Health Maintenance Health Maintenance  Topic Date Due   OPHTHALMOLOGY EXAM  Never done   HPV VACCINES (1 - Risk 3-dose SCDM series) Never done   Cervical Cancer Screening (HPV/Pap Cotest)  04/20/2011   Diabetic kidney evaluation - Urine ACR  08/10/2019   COVID-19 Vaccine (2 - Mixed Product risk series) 02/11/2020   HEMOGLOBIN A1C  02/13/2022   FOOT EXAM  07/15/2022   Diabetic kidney evaluation - eGFR measurement  06/12/2023   Influenza Vaccine  09/30/2023   Mammogram  02/17/2024   Medicare Annual Wellness (AWV)  01/11/2025   Pneumococcal Vaccine  Completed  Hepatitis B Vaccines 19-59 Average Risk  Completed   Hepatitis C Screening  Completed   HIV Screening  Completed   Meningococcal B Vaccine  Aged Out   DTaP/Tdap/Td  Discontinued        Assessment/Plan:  This is a routine wellness examination for Sally Porter.  Patient Care Team: Severa Rock HERO, FNP as PCP - General (Family Medicine) Billee Mliss BIRCH, [facility] as Pharmacist (Family Medicine) [place], [facility], Ross Lacy Cohn, MD as Referring Physician [facility])  I have personally reviewed and noted the following in the patient's chart:   Medical and social history Use of alcohol, tobacco or illicit drugs  Current medications and supplements including opioid prescriptions. Functional ability and status Nutritional status Physical activity Advanced directives List of other physicians Hospitalizations, surgeries, and ER visits in previous 12 months Vitals Screenings to include cognitive, depression, and falls Referrals and appointments  No orders of the defined types were placed in this encounter.  In addition, I have reviewed and discussed with patient certain preventive protocols, quality metrics, and best practice recommendations. A written personalized care plan for preventive services as well as general preventive health  recommendations were provided to patient.   Ozie Ned, CMA   01/12/2024   Return in 1 year (on 01/11/2025).  After Visit Summary: (MyChart) Due to this being a telephonic visit, the after visit summary with patients personalized plan was offered to patient via MyChart   Nurse Notes: PCP F/U: Pt is aware and due the following: Covid, flu,  and HPV vaccines, Kidney Health Evaluation (eGFR/UrinACR), A1c, Foot exam, and Diabetic Eye exam, mammogram, pap smear.

## 2024-01-16 ENCOUNTER — Ambulatory Visit: Payer: Self-pay

## 2024-01-16 NOTE — Telephone Encounter (Signed)
 Appt made.

## 2024-01-16 NOTE — Telephone Encounter (Signed)
 FYI Only or Action Required?: FYI only for provider: appointment scheduled on 01/17/24.  Patient was last seen in primary care on 04/27/2023 by Severa Rock HERO, FNP.  Called Nurse Triage reporting Nasal Congestion and Cough.  Symptoms began a week ago.  Interventions attempted: OTC medications: cough drops and Robitussin.  Symptoms are: unchanged.  Triage Disposition: See Within 3 Days in Office (overriding Home Care)  Patient/caregiver understands and will follow disposition?: Yes                                 Reason for Triage: patient has been having symptoms for a week, has been having bad cough and now she has scratchy throat coughing and this was continual and now that has stopped and now she is having sinus concerns and now when she is blowing her nose she can see traces of blood but not all the time so the patient would like to ask for a medication called into her pharmacy  Pt num [phone] (H)  Reason for Disposition  Cough with cold symptoms (e.g., runny nose, postnasal drip, throat clearing)  Answer Assessment - Initial Assessment Questions 1. ONSET: When did the cough begin?      A week ago 2. SEVERITY: How bad is the cough today?      Sporadic coughing spells, states coughing spells are worse at night 3. SPUTUM: Describe the color of your sputum (e.g., none, dry cough; clear, white, yellow, green)     Brownish 4. HEMOPTYSIS: Are you coughing up any blood? If Yes, ask: How much? (e.g., flecks, streaks, tablespoons, etc.)     States she has seen small strands of blood mixed in nasal congestion, denies coughing up blood 5. DIFFICULTY BREATHING: Are you having difficulty breathing? If Yes, ask: How bad is it? (e.g., mild, moderate, severe)      Denies 6. FEVER: Do you have a fever? If Yes, ask: What is your temperature, how was it measured, and when did it start?     Denies  7. CARDIAC HISTORY: Do you have any history  of heart disease? (e.g., heart attack, congestive heart failure)      Denies 8. LUNG HISTORY: Do you have any history of lung disease?  (e.g., pulmonary embolus, asthma, emphysema)     Denies 9. PE RISK FACTORS: Do you have a history of blood clots? (or: recent major surgery, recent prolonged travel, bedridden)     Denies history of blood clots 10. OTHER SYMPTOMS: Do you have any other symptoms? (e.g., runny nose, wheezing, chest pain)     Hoarseness, nasal congestion, recent sinus headache, denies chest pain, denies wheezing, denies headache 11. PREGNANCY: Is there any chance you are pregnant? When was your last menstrual period?     N/A 12. TRAVEL: Have you traveled out of the country in the last month? (e.g., travel history, exposures)     N/A    Patient specifically requested a virtual appointment for symptoms. This RN scheduled a virtual appointment with PCP for tomorrow morning.  Protocols used: Cough - Acute Productive-A-AH

## 2024-01-17 ENCOUNTER — Telehealth (INDEPENDENT_AMBULATORY_CARE_PROVIDER_SITE_OTHER): Admitting: Family Medicine

## 2024-01-17 ENCOUNTER — Inpatient Hospital Stay: Payer: Self-pay | Admitting: Family Medicine

## 2024-01-17 ENCOUNTER — Encounter: Payer: Self-pay | Admitting: Family Medicine

## 2024-01-17 DIAGNOSIS — R059 Cough, unspecified: Secondary | ICD-10-CM | POA: Diagnosis not present

## 2024-01-17 DIAGNOSIS — J0140 Acute pansinusitis, unspecified: Secondary | ICD-10-CM

## 2024-01-17 DIAGNOSIS — J3489 Other specified disorders of nose and nasal sinuses: Secondary | ICD-10-CM | POA: Diagnosis not present

## 2024-01-17 MED ORDER — LEVOCETIRIZINE DIHYDROCHLORIDE 5 MG PO TABS
5.0000 mg | ORAL_TABLET | Freq: Every evening | ORAL | 0 refills | Status: AC
Start: 2024-01-17 — End: ?

## 2024-01-17 MED ORDER — AZELASTINE HCL 0.1 % NA SOLN: 1.0000 | Freq: Two times a day (BID) | NASAL | 5 refills | Status: AC

## 2024-01-17 MED ORDER — BENZONATATE 200 MG PO CAPS: 200.0000 mg | ORAL_CAPSULE | Freq: Two times a day (BID) | ORAL | 0 refills | Status: AC | PRN

## 2024-01-17 MED ORDER — DOXYCYCLINE HYCLATE 100 MG PO TABS: 100.0000 mg | ORAL_TABLET | Freq: Two times a day (BID) | ORAL | 0 refills | Status: AC

## 2024-01-17 NOTE — Progress Notes (Signed)
 Virtual Visit via Video   I connected with patient on 01/17/24 at 1035 by a video enabled telemedicine application and verified that I am speaking with the correct person using two identifiers.  Location patient: Home Location provider: [facility] Persons participating in the virtual visit: Patient and Provider  I discussed the limitations of evaluation and management by telemedicine and the availability of in person appointments. The patient expressed understanding and agreed to proceed.  Subjective:   HPI:  Pt presents today for  Chief Complaint  Patient presents with   Cough   Sally Porter is a 43 year old female who presents with hoarseness and a persistent cough.  Hoarseness and voice changes - Hoarseness without sore throat or chest pain - Voice sounds muffled and 'real nasally'  Cough - Persistent, hacking cough, more pronounced at night - Often accompanied by a tickling sensation in the throat - Previously coughed up brown sputum, now resolved  Nasal symptoms - Runny nose without congestion - Occasional small traces of blood when blowing nose  General symptoms - No sore throat, chest pain, or feeling unwell  Response to medications - Uses Flonase  daily - Recently took Robitussin and Mucinex - Mucinex provided significant relief last night, but symptoms returned this morning      ROS per HPI  Patient Active Problem List   Diagnosis Date Noted   Renal transplant recipient 04/27/2023   Anxiety 10/05/2022   Restless leg syndrome 10/05/2022   Chronic pain syndrome 10/05/2022   Intractable nausea and vomiting 08/14/2021   Morbid obesity (HCC) 08/14/2021   Opioid dependence (HCC) 08/14/2021   Insomnia 06/22/2019   Long-term current use of opiate analgesic 06/22/2019   Type 1 diabetes mellitus with complications (HCC) 05/01/2019   Mixed diabetic hyperlipidemia associated with type 1 diabetes mellitus (HCC) 05/01/2019    Hypertension associated with diabetes (HCC) 05/01/2019   Vitamin D  deficiency 05/01/2019   Personal history of noncompliance with medical treatment, presenting hazards to health 05/01/2019   Hyperkalemia 05/01/2019   Iron deficiency anemia 09/12/2017   Charcot foot due to diabetes mellitus (HCC) 03/15/2014    Social History   Tobacco Use   Smoking status: Former    Current packs/day: 0.00    Average packs/day: 0.5 packs/day for 3.0 years (1.5 ttl pk-yrs)    Types: Cigarettes    Start date: 2010    Quit date: 2013    Years since quitting: 12.8   Smokeless tobacco: Never  Substance Use Topics   Alcohol use: Not Currently    Current Outpatient Medications:    azelastine (ASTELIN) 0.1 % nasal spray, Place 1 spray into both nostrils 2 (two) times daily. Use in each nostril as directed, Disp: 30 mL, Rfl: 5   benzonatate (TESSALON) 200 MG capsule, Take 1 capsule (200 mg total) by mouth 2 (two) times daily as needed for cough., Disp: 20 capsule, Rfl: 0   doxycycline  (VIBRA -TABS) 100 MG tablet, Take 1 tablet (100 mg total) by mouth 2 (two) times daily for 7 days., Disp: 14 tablet, Rfl: 0   levocetirizine (XYZAL) 5 MG tablet, Take 1 tablet (5 mg total) by mouth every evening., Disp: 90 tablet, Rfl: 0   acetaminophen  (TYLENOL ) 325 MG tablet, Take 650 mg by mouth every 6 (six) hours as needed., Disp: , Rfl:    albuterol  (VENTOLIN  HFA) 108 (90 Base) MCG/ACT inhaler, Inhale 2 puffs into the lungs every 6 (six) hours as needed for wheezing or shortness of breath.,  Disp: 8 g, Rfl: 5   atorvastatin  (LIPITOR) 20 MG tablet, TAKE 1 TABLET BY MOUTH DAILY AT 6PM, Disp: 90 tablet, Rfl: 0   Continuous Glucose Sensor (DEXCOM G7 SENSOR) MISC, apply TO ARM EVERY 10 DAYS TO test blood sugar continuously, Disp: 9 each, Rfl: 3   ferrous sulfate  325 (65 FE) MG tablet, Take 325 mg by mouth daily with breakfast., Disp: , Rfl:    ferrous sulfate  ER (SLOW FE) 142 (45 Fe) MG TBCR tablet, Take by mouth., Disp: , Rfl:     fluticasone  (FLONASE ) 50 MCG/ACT nasal spray, place 2 SPRAYS into BOTH nostrils DAILY, Disp: 16 g, Rfl: 6   insulin  glargine (LANTUS  SOLOSTAR) 100 UNIT/ML Solostar Pen, Inject 30 Units into the skin at bedtime., Disp: 30 mL, Rfl: 0   insulin  lispro (HUMALOG  KWIKPEN) 100 UNIT/ML KwikPen, Inject 0-15 Units into the skin 3 (three) times daily. Sliding scale, Disp: 15 mL, Rfl: 0   Insulin  Pen Needle 32G X 4 MM MISC, Use with insulin  TID Dx E11.65, Disp: 300 each, Rfl: 3   LASIX 40 MG tablet, Take 40 mg by mouth daily., Disp: , Rfl:    lidocaine  (XYLOCAINE ) 5 % ointment, Apply 1 Application topically 2 (two) times daily as needed for moderate pain., Disp: , Rfl:    methocarbamol (ROBAXIN) 500 MG tablet, Take by mouth. (Patient not taking: Reported on 01/12/2024), Disp: , Rfl:    naloxone  (NARCAN ) nasal spray 4 mg/0.1 mL, Place 0.4 mg into the nose once., Disp: , Rfl:    nortriptyline  (PAMELOR ) 25 MG capsule, TAKE ONE CAPSULE BY MOUTH TWICE DAILY, Disp: 180 capsule, Rfl: 0   Oxycodone  HCl 10 MG TABS, Take 10 mg by mouth every 6 (six) hours., Disp: , Rfl:    polyethylene glycol (MIRALAX  / GLYCOLAX ) 17 g packet, Take 17 g by mouth daily. (Patient taking differently: Take 17 g by mouth daily as needed for moderate constipation or mild constipation.), Disp: 14 each, Rfl: 0   predniSONE  (DELTASONE ) 5 MG tablet, Take 5 mg by mouth daily., Disp: , Rfl:    rOPINIRole  (REQUIP ) 0.5 MG tablet, TAKE 2 TABLETS BY MOUTH AT BEDTIME, Disp: 180 tablet, Rfl: 0   senna-docusate (SENOKOT-S) 8.6-50 MG tablet, Take 1 tablet by mouth 2 (two) times daily. (Patient taking differently: Take 1 tablet by mouth daily as needed for moderate constipation or mild constipation.), Disp: 60 tablet, Rfl: 0   senna-docusate (SENOKOT-S) 8.6-50 MG tablet, Take by mouth., Disp: , Rfl:    Sodium Zirconium Cyclosilicate (LOKELMA PO), Take by mouth., Disp: , Rfl:    tacrolimus  (PROGRAF ) 1 MG capsule, Take by mouth. (Patient not taking: Reported on  01/12/2024), Disp: , Rfl:    tacrolimus  ER (ENVARSUS  XR) 1 MG TB24, Take 1 mg by mouth daily before breakfast. Pt taking tablets 2 daily, Disp: , Rfl:    zinc gluconate 50 MG tablet, Take 50 mg by mouth daily., Disp: , Rfl:   Allergies  Allergen Reactions   Ondansetron  Other (See Comments)    Caused her more nausea-can take phenergan    Other Other (See Comments)    Burning skin   Amoxicillin  Nausea And Vomiting   Tylenol  [Acetaminophen ] Other (See Comments)    Patient stated she does not take due to her kidney function.   Dilaudid  [Hydromorphone ] Itching   Morphine And Codeine Itching   Pregabalin Itching and Other (See Comments)    Objective:   There were no vitals taken for this visit.  Patient is well-developed,  well-nourished in no acute distress.  Resting comfortably at home.  Head is normocephalic, atraumatic.  No labored breathing.  Speech is clear and coherent with logical content.  Patient is alert and oriented at baseline.    Assessment and Plan:   Sally Porter was seen today for cough.  Diagnoses and all orders for this visit:  Acute non-recurrent pansinusitis -     azelastine (ASTELIN) 0.1 % nasal spray; Place 1 spray into both nostrils 2 (two) times daily. Use in each nostril as directed -     levocetirizine (XYZAL) 5 MG tablet; Take 1 tablet (5 mg total) by mouth every evening. -     doxycycline  (VIBRA -TABS) 100 MG tablet; Take 1 tablet (100 mg total) by mouth 2 (two) times daily for 7 days. -     benzonatate (TESSALON) 200 MG capsule; Take 1 capsule (200 mg total) by mouth 2 (two) times daily as needed for cough.  Sinus pressure -     azelastine (ASTELIN) 0.1 % nasal spray; Place 1 spray into both nostrils 2 (two) times daily. Use in each nostril as directed -     levocetirizine (XYZAL) 5 MG tablet; Take 1 tablet (5 mg total) by mouth every evening. -     doxycycline  (VIBRA -TABS) 100 MG tablet; Take 1 tablet (100 mg total) by mouth 2 (two) times daily for 7  days. -     benzonatate (TESSALON) 200 MG capsule; Take 1 capsule (200 mg total) by mouth 2 (two) times daily as needed for cough.  Cough in adult -     azelastine (ASTELIN) 0.1 % nasal spray; Place 1 spray into both nostrils 2 (two) times daily. Use in each nostril as directed -     levocetirizine (XYZAL) 5 MG tablet; Take 1 tablet (5 mg total) by mouth every evening. -     doxycycline  (VIBRA -TABS) 100 MG tablet; Take 1 tablet (100 mg total) by mouth 2 (two) times daily for 7 days. -     benzonatate (TESSALON) 200 MG capsule; Take 1 capsule (200 mg total) by mouth 2 (two) times daily as needed for cough.       Acute pansinusitis with cough and nasal symptoms Presents with hoarseness, consistent hacking cough, and nasal symptoms suggestive of acute pansinusitis. Cough is more pronounced at night and associated with a tickling sensation in the throat. Nasal symptoms include rhinorrhea and occasional epistaxis. No sore throat or chest pain. Symptoms suggestive of postnasal drip contributing to the cough. Differential includes sinus infection, but no significant nasal congestion reported. Current use of Flonase  and Robitussin, with recent use of Mucinex providing some relief. - Switch from Flonase  to Astelin, to be used twice daily. - Prescribed Xyzal to be taken every night. - If symptoms persist or worsen after a few days, start doxycycline .        Return if symptoms worsen or fail to improve.  Rosaline Bruns, FNP-C [facility] [address] [place], [place] [address] [phone]  01/17/2024  Time spent with the patient: 12 minutes, of which >50% was spent in obtaining information about symptoms, reviewing previous labs, evaluations, and treatments, counseling about condition (please see the discussed topics above), and developing a plan to further investigate it; had a number of questions which I addressed.

## 2024-02-10 ENCOUNTER — Inpatient Hospital Stay: Admitting: Family Medicine

## 2024-02-15 ENCOUNTER — Telehealth: Payer: Self-pay

## 2024-02-15 NOTE — Telephone Encounter (Signed)
 Patient was identified as falling into the [place] Measure - Diabetes.   Patient was: Appointment scheduled with primary care provider in the next 30 days.

## 2024-02-27 NOTE — Progress Notes (Signed)
 Case Management Screening  CSN: [id number] DOB: 1980-04-13 Service: Transplant Location: C504/01   Initial Screening Risk Level: Low - Patient does not meet high risk criteria for post hospital services. Social Investment Banker, Operational will be available to assist as indicated.  Pt is a 43 y.o. female s/p DDKT 12/12/22 presents with c/f infection.MSW will cont to follow and intervene as indicated for disposition planning needs.  Daine Lee MSW, Case Manager, [employer] Work Cell# [phone] Office: [phone] Pager: [phone]

## 2024-02-27 NOTE — ED Triage Notes (Signed)
 Patient is from home for fever, chills, nausea, and vomiting. Patient states she turned off her insulin  pump today due to vomiting. EMS states blood glucose was 525 in route to ED.

## 2024-02-27 NOTE — ED Notes (Signed)
 Pt accepted @ [facility] [id number] by Dr. Rockland. Pt updated w/ plan of care, states verbal understanding and given opportunity to ask questions

## 2024-02-28 NOTE — Care Plan (Signed)
 Team Members: Dr. Dale Craw MD- Attending, Hart Sor MD- Txp Fellow, Norleen Ellen MD- Intern, Debra Felts FNP-C, Dorothyann Cera PA-C, Ezell Lamas RN- Txp Coordinator, Avelina Montes- PharmD Resident  48yr F, s/p DDRT 12/12/22. Patient admitted as a transfer from [facility] after presenting for fever, chills, nausea, and vomiting. Per EMS, blood glucose was 525 in route to ED (insulin  pump was turnoff by patient d/t vomiting). Urinalysis concerning for UTI. Patient reports some shortness of breath and swelling d/t volume. Labs: Cr- 3.10 (2.44), BUN- 37 (47). Fluids: SL. Meds: Envarsus  4mg  Daily, Myfortic 180mg  BID, Prednisone  5mg  Daily. Plan: Glucose management, Discontinue antibiotics, Lasix 100mg  IV x1 dose

## 2024-02-28 NOTE — Discharge Summary (Signed)
 Transplant Surgery Discharge Summary  Patient ID: Sally Porter [id number] 43 y.o. 1980/08/18  Admit date: 02/27/2024 12:06 PM  Discharge date: 02/29/2024  Admitting Physician: Lamar Fairy [place], MD  Discharge Physician: Lonni Roys, MD  Admission Diagnoses:  Hyperglycemia [R73.9]  Discharge Diagnoses:  Principal Problem (Resolved):   Hyperglycemia Active Problems:   Status post kidney transplant (CMD)   Immunosuppression due to drug therapy (CMD)   CKD (chronic kidney disease) stage 4, GFR 15-29 ml/min    (CMD)   Hypervolemia   Admission Condition: good Discharged Condition: good  Patient Summary:  Patient is a 43 y.o. female with ESRD secondary to T1DM s/p DDKT on 12/12/2022. PRA 0%. HLA MM 2-2-1. CMV D+/R+. Donor: age 81-45, KDPI 44%. Had DGF with last dialysis on 12/14/2023. EDW 104 kg. PMH otherwise significant for: PMH otherwise notable for HTN, DM (T1DM/T2DM?), chronic low back pain/chronic pain disorder on long term opioids, RLS, HLD, tricuspid regurgitation, IDA.  Patients post transplant course complicated by perirenal fluid collections, PNF volume overload, cardiac dysfunction, AKI, DSA and recurrent UTIs.     Indication for Admission: Patient presented with chills x several days and nausea/vomiting x1 day from an outside facility. Patient turned off insulin  pump 2/2 vomiting and developed hyperglycemia (525). CT abdomen/pelvis from OSH showed no new perinephric or intra-abdominal fluid collections. On arrival to transplant floor, empiric antibiotics initiated for possible infectious source. Additional concern for increased work of breathing and weight gain in the context of HFpEF and significant tricuspid regurgitation.  Hospital Course:  Infectious Disease / Transplant:  Outside urinalysis appeared contaminated; culture pending from OSH. No in-house urine culture was obtained. Empiric antibiotics started: Vancomycin  and Ceftriaxone . ID  recommended empiric UTI treatment. Will be discharged on Cipro Patient remained afebrile throughout hospitalization. CT A/P (OSH) negative for new perinephric or intra-abdominal collections. Monitoring for graft function and renal parameters continued; no evidence of pyelonephritis or intra-abdominal source during admission.  Cardiology / Volume Status:  Patient reported increased work of breathing and 6 lb weight gain compared to last transplant clinic visit (Oct 2025). Known severe tricuspid regurgitation and HFpEF. On Demadex (torsemide) 60 mg BID at home; patient self-increased to TID prior to admission. Cardiology was contacted; outpatient appointment originally for 02/28/2024--team plans to reschedule in the near term after inpatient review. Inpatient management focused on cautious diuresis, daily weights, and renal function monitoring given transplant status and loop diuretic use.  Nephrology:  Consulted for potential ultrafiltration (UF) due to persistent volume overload despite high-dose loop diuretics.  UF was considered but not performed during admission; patient responded to medical diuresis. Recommendations: Continue oral diuretics, monitor renal function closely, and reassess UF need if outpatient diuretic resistance develops.  Clinical Status at Discharge:  Afebrile, hemodynamically stable. Symptoms of chills/nausea improved. No acute abdominal findings; respiratory symptoms improved with diuretic optimization. Tolerating diet, able to ambulate, adequate oral intake.  The patient has met all goals necessary for discharge from a medical, surgical and therapy standpoint. Thus, the patient is stable and suitable for discharge. New discharge medications were discussed in detail and the patient stated understanding of use and administration  Pending/Follow-Up Items  Cardiology Outpatient Appointment: Reschedule (originally 02/28/2024). Nephrology Follow-Up: Monitor for  diuretic resistance; consider UF if outpatient weight gain or worsening symptoms. Transplant ID follow up for recurrent UTIs    Patient's Ordered Code Status: Full Code  Consults: Nephrology Infectious Disease   Significant Diagnostic Studies:  No orders to display    Treatments:  analgesia: Acetaminophen ,  Oxycodone  (chronic pain), Oral and IV diuretics,  Insulin   Disposition: Home  Patient Instructions:   1. Advised to contact Coordinator Delon Snipes, RN, phone number [phone]. If unable to contact call front desk number [phone] and ask for coordinator in her office. Office is open from 8 am to 5pm.   2. PAL line [phone] on weekends, holidays, and after office hours.   3. Call with temp > 100.5, nausea, vomiting, diarrhea, uncontrolled pain, incision issues (redness, drainage that is milky or foul smelling, or incision opening), decreased urine output or any issues you may have concerns about.   4. Clinic follow up visit given on discharge instructions.   5. Drink around 2-3 liters of water a day and use the bathroom frequently, every 1.5-2 hours with frequent stops with car rides.  6. Plan for home care with self monitoring of vital signs and regular outpatient follow up, as scheduled.  7. Resume/Start regularly scheduled medications    Discharge Medications:    Medication List     PAUSE taking these medications    nortriptyline  25 mg capsule Wait to take this until your doctor or other care provider tells you to start again. Commonly known as: PAMELOR  Take 1 capsule (25 mg total) by mouth 2 (two) times a day.       START taking these medications    ciprofloxacin 500 mg tablet Commonly known as: CIPRO Take 1 tablet (500 mg total) by mouth 2 (two) times a day for 7 days.   furosemide 40 mg tablet Commonly known as: LASIX Take 3 tablets (120 mg total) by mouth 2 (two) times a day.   Slow Fe 137 mg (45 mg iron) Tber Generic drug: ferrous  sulfate Take 1 tablet (137 mg total) by mouth daily.       CHANGE how you take these medications    predniSONE  5 mg tablet Commonly known as: DELTASONE  Take 1 tablet by mouth daily. (Take 1 tablet (5 mg total) by mouth daily. 5 mg daily)   tacrolimus  1 mg Tb24 extended release tablet Commonly known as: Envarsus  XR Take 4 tablets (4 mg total) by mouth in the morning. What changed: how much to take       CONTINUE taking these medications    albuterol  HFA 90 mcg/actuation inhaler Commonly known as: PROVENTIL  HFA;VENTOLIN  HFA;PROAIR  HFA Inhale 2 puffs every 6 (six) hours as needed for shortness of breath or wheezing.   atorvastatin  20 mg tablet Commonly known as: LIPITOR Take 1 tablet (20 mg total) by mouth at bedtime.   azelastine  137 mcg (0.1 %) nasal spray Commonly known as: ASTELIN  Administer 1 spray into each nostril 2 (two) times a day.   carvedilol 6.25 mg tablet Commonly known as: COREG Take 2 tablets (12.5 mg total) by mouth in the morning and 2 tablets (12.5 mg total) in the evening. Take with meals.   Dexcom G7 Receiver Generic drug: blood-glucose meter,receiver,continuous Use for continuous glucose monitor   Dexcom G7 Sensor Generic drug: blood-glucose sensor Use for continuous glucose monitoring. Change sensor every 10 days. (Please change every 10 days)   eszopiclone 2 mg tablet Commonly known as: LUNESTA Take 2 mg by mouth nightly as needed.   Excedrin Extra Strength 250-250-65 mg Tab per tablet Generic drug: aspirin-acetaminophen -caffeine Take 1 tablet by mouth every 6 (six) hours as needed for headaches or migraine.   famotidine 20 mg tablet Commonly known as: PEPCID Take 1 tablet (20 mg total) by mouth nightly.   fluticasone   propionate 50 mcg/spray nasal spray Commonly known as: FLONASE  Administer 2 sprays into each nostril daily as needed for allergies or rhinitis.   folic acid  1 mg tablet Commonly known as: FOLVITE  Take 1 tablet (1 mg  total) by mouth daily.   HumaLOG  KwikPen Insulin  100 unit/mL KwikPen Generic drug: insulin  lispro Inject 16 Units under the skin 3 (three) times a day with meals.   Lantus  Solostar U-100 Insulin  100 unit/mL (3 mL) pen Generic drug: insulin  glargine Inject 30 Units under the skin 2 (two) times a day.   levocetirizine 5 mg tablet Commonly known as: XYZAL  Take 5 mg by mouth every evening.   mycophenolate 180 mg Tbec DR tablet Commonly known as: MYFORTIC Take 1 tablet (180 mg total) by mouth 2 (two) times a day.   oxyCODONE  10 mg Tab Commonly known as: ROXICODONE  Take 10 mg by mouth every 6 (six) hours.   torsemide 20 mg tablet Commonly known as: DEMADEX Take 3 tablets (60 mg total) by mouth 2 (two) times a day.       STOP taking these medications    atovaquone 750 mg/5 mL suspension Commonly known as: MEPRON   rOPINIRole  0.5 mg tablet Commonly known as: REQUIP    sennosides-docusate sodium  8.6-50 mg per tablet Commonly known as: PERICOLACE         Where to Get Your Medications     These medications were sent to [facility] [address] [facility], [place] [address]    Hours: Mon-Fri: 8:30am-5pm; Sat-Sun: Closed; Holidays: Closed Thanksgiving and Christmas Phone: [phone]  ciprofloxacin 500 mg tablet furosemide 40 mg tablet tacrolimus  1 mg Tb24 extended release tablet     Follow-up Appointments Scheduled Future Appointments       Provider Department Dept Phone Center   03/07/2024 11:00 AM [facility] TRANSPLANT MASTER SCHEDULE [facility] [place] 91 Abdominal Organ Transplant [phone] [facility] [place]   03/12/2024 2:00 PM [facility] TRANSPLANT MASTER SCHEDULE [facility] [place] 91 Abdominal Organ Transplant [phone] [facility] [place]   03/20/2024 1:40 PM Bernardino Darleene Clas [facility] [place] 91 Infectious Disease Transplant [phone] [facility] [place]        Electronically signed by:  Dorothyann Anette Cera, PA-C 02/29/2024 11:41 AM  Time spent on discharge: 45 minutes

## 2024-02-29 NOTE — Care Plan (Signed)
 Team Members: Dr. Medford Roys MD- Attending, Hart Sor MD- Txp Fellow, Norleen Ellen MD- Intern, Debra Felts FNP-C, Dorothyann Cera PA-C, Ezell Lamas RN- Txp Coordinator, Elsie Acton- PharmD   72yr F, s/p DDRT 12/12/22. Patient admitted as a transfer from [facility] after presenting for fever, chills, nausea, and vomiting. Per EMS, blood glucose was 525 in route to ED (insulin  pump was turnoff by patient d/t vomiting). Urinalysis concerning for UTI. Patient reports some shortness of breath and swelling d/t volume, diuretic has been adjusted. Cardiology will reschedule missed appt d/t being in-house. Labs: Cr- 3.18 (3.10), BUN- 37 (37). Fluids: SL. Meds: Envarsus  4mg  Daily, Myfortic 180mg  BID, Prednisone  5mg  Daily. Plan: Cipro for empiric UTI treatment, Lasix 120mg  BID, Discharge home today. RTC: Wednesday 03/07/24 @11 :00am

## 2024-03-02 ENCOUNTER — Telehealth: Payer: Self-pay

## 2024-03-02 NOTE — Transitions of Care (Post Inpatient/ED Visit) (Signed)
 "  03/02/2024  Name: Sally Porter MRN: [id number] DOB: Jan 26, 1981  Today's TOC FU Call Status: Today's TOC FU Call Status:: Successful TOC FU Call Completed TOC FU Call Complete Date: 03/02/24  Patient's Name and Date of Birth confirmed. Name, DOB  Transition Care Management Follow-up Telephone Call Date of Discharge: 02/29/24 Discharge Facility: Other ([facility]) Name of Other ([facility]) Discharge Facility: [facility] Type of Discharge: Inpatient Admission Primary Inpatient Discharge Diagnosis:: Hyperglycemia How have you been since you were released from the hospital?: Better Any questions or concerns?: No  Items Reviewed: Did you receive and understand the discharge instructions provided?: Yes Medications obtained,verified, and reconciled?: Yes (Medications Reviewed) Any new allergies since your discharge?: No Dietary orders reviewed?: Yes Type of Diet Ordered:: Heart Healthy, Diabetic Do you have support at home?: Yes People in Home [RPT]: parent(s) Name of Support/Comfort Primary Source: Sally Porter,Sally Porter  Mother, Emergency Contact  [phone] (Mobile)  Medications Reviewed Today: Medications Reviewed Today     Reviewed by Carolee Heron NOVAK, RN (Case Manager) on 03/02/24 at 1303  Med List Status: <None>   Medication Order Taking? Sig Documenting Provider Last Dose Status Informant  acetaminophen  (TYLENOL ) 325 MG tablet [id number]  Take 650 mg by mouth every 6 (six) hours as needed.  Patient not taking: Reported on 03/02/2024   Provider, Historical, Sally Porter  Active   albuterol  (VENTOLIN  HFA) 108 (90 Base) MCG/ACT inhaler [id number] Yes Inhale 2 puffs into the lungs every 6 (six) hours as needed for wheezing or shortness of breath. Sally Porter, Sally M, NP  Active Self  atorvastatin  (LIPITOR) 20 MG tablet [id number] Yes TAKE 1 TABLET BY MOUTH DAILY AT JEREL Sally Sally CHRISTELLA, Sally Porter  Active   azelastine  (ASTELIN ) 0.1 % nasal spray [id number] Yes Place 1 spray into both nostrils 2 (two) times  daily. Use in each nostril as directed Sally Porter, Sally CHRISTELLA, Sally Porter  Active   benzonatate  (TESSALON ) 200 MG capsule [id number]  Take 1 capsule (200 mg total) by mouth 2 (two) times daily as needed for cough.  Patient not taking: Reported on 03/02/2024   Sally Sally CHRISTELLA, Sally Porter  Active   carvedilol (COREG) 6.25 MG tablet [id number]  Take 12.5 mg by mouth 2 (two) times daily with a meal. Provider, Historical, Sally Porter  Active   ciprofloxacin (CIPRO) 500 MG tablet [id number] Yes Take 500 mg by mouth 2 (two) times daily. For 7 days Provider, Historical, Sally Porter  Active   Continuous Glucose Sensor (DEXCOM G7 SENSOR) MISC [id number] Yes apply TO ARM EVERY 10 DAYS TO test blood sugar continuously Sally Porter, Sally CHRISTELLA, Sally Porter  Active   eszopiclone (LUNESTA) 2 MG TABS tablet [id number] Yes Take 2 mg by mouth at bedtime as needed for sleep. Take immediately before bedtime Provider, Historical, Sally Porter  Active   ferrous sulfate  325 (65 FE) MG tablet [id number]  Take 325 mg by mouth daily with breakfast.  Patient not taking: Reported on 03/02/2024   Provider, Historical, Sally Porter  Active Self  ferrous sulfate  ER (SLOW FE) 142 (45 Fe) MG TBCR tablet [id number] Yes Take by mouth. Provider, Historical, Sally Porter  Active   fluticasone  (FLONASE ) 50 MCG/ACT nasal spray [id number] Yes place 2 SPRAYS into BOTH nostrils DAILY Sally Porter, Sally CHRISTELLA, Sally Porter  Active   folic acid  (FOLVITE ) 1 MG tablet [id number] Yes Take 1 mg by mouth daily. Provider, Historical, Sally Porter  Active   furosemide (LASIX) 40 MG tablet [id number]  Take 120 mg by mouth 2 (two) times daily.  Patient not taking: Reported on 03/02/2024   Provider, Historical,  Sally Porter  Active   insulin  glargine (LANTUS  SOLOSTAR) 100 UNIT/ML Solostar Pen [id number] Yes Inject 30 Units into the skin at bedtime. Sally Sally HERO, Sally Porter  Active   insulin  lispro (HUMALOG  KWIKPEN) 100 UNIT/ML KwikPen [id number] Yes Inject 0-15 Units into the skin 3 (three) times daily. Sliding scale Sally Porter, Sally M, NP  Active Self           Med Note (PRUITT, JULIE D   Thu Mar 04, 2022 10:12 AM) 8-18units meal dependent  Insulin  Pen Needle 32G X 4 MM MISC [id number] Yes Use with insulin  TID Dx E11.65 Sally Porter, Sally M, NP  Active Self  LASIX 40 MG tablet [id number]  Take 40 mg by mouth daily.  Patient not taking: Reported on 03/02/2024   Provider, Historical, Sally Porter  Active Self  levocetirizine (XYZAL ) 5 MG tablet [id number] Yes Take 1 tablet (5 mg total) by mouth every evening. Sally Sally HERO, Sally Porter  Active   lidocaine  (XYLOCAINE ) 5 % ointment [id number]  Apply 1 Application topically 2 (two) times daily as needed for moderate pain. Provider, Historical, Sally Porter  Active Self  methocarbamol (ROBAXIN) 500 MG tablet [id number]  Take by mouth.  Patient not taking: Reported on 03/02/2024   Provider, Historical, Sally Porter  Active   mycophenolate (MYFORTIC) 180 MG EC tablet [id number] Yes Take 180 mg by mouth 2 (two) times daily. Provider, Historical, Sally Porter  Active   naloxone  (NARCAN ) nasal spray 4 mg/0.1 mL [id number]  Place 0.4 mg into the nose once. Provider, Historical, Sally Porter  Active Self  nortriptyline  (PAMELOR ) 25 MG capsule [id number]  TAKE ONE CAPSULE BY MOUTH TWICE DAILY  Patient not taking: Reported on 03/02/2024   Sally Sally HERO, Sally Porter  Active   Oxycodone  HCl 10 MG TABS [id number] Yes Take 10 mg by mouth every 6 (six) hours. Provider, Historical, Sally Porter  Active Self  polyethylene glycol (MIRALAX  / GLYCOLAX ) 17 g packet [id number] Yes Take 17 g by mouth daily.  Patient taking differently: Take 17 g by mouth daily as needed for moderate constipation or mild constipation.   Sally Buttery, Sally Porter  Active Self  predniSONE  (DELTASONE ) 5 MG tablet [id number] Yes Take 5 mg by mouth daily. Provider, Historical, Sally Porter  Active   rOPINIRole  (REQUIP ) 0.5 MG tablet [id number]  TAKE 2 TABLETS BY MOUTH AT BEDTIME  Patient not taking: Reported on 03/02/2024   Sally Sally HERO, Sally Porter  Active   senna-docusate (SENOKOT-S) 8.6-50 MG tablet [id number]  Take 1 tablet by mouth 2 (two) times daily.  Patient not taking: Reported on 03/02/2024    Sally Buttery, Sally Porter  Active Self  senna-docusate (SENOKOT-S) 8.6-50 MG tablet [id number]  Take by mouth.  Patient not taking: Reported on 03/02/2024   Provider, Historical, Sally Porter  Active   Sodium Zirconium Cyclosilicate (LOKELMA PO) [id number]  Take by mouth. Provider, Historical, Sally Porter  Active   tacrolimus  (PROGRAF ) 1 MG capsule [id number]  Take by mouth.  Patient not taking: Reported on 03/02/2024   Provider, Historical, Sally Porter  Active   tacrolimus  ER (ENVARSUS  XR) 1 MG TB24 [id number] Yes Take 1 mg by mouth daily before breakfast. Pt taking tablets 2 daily  Patient taking differently: Take 4 mg by mouth daily before breakfast. Pt taking tablets 4 daily   Provider, Historical, Sally Porter  Active   torsemide (DEMADEX) 20 MG tablet [id number] Yes Take 60 mg by mouth 2 (two) times daily. Provider, Historical, Sally Porter  Active   zinc gluconate 50 MG tablet [id number]  Take 50 mg by mouth daily. Provider, Historical,  Sally Porter  Active Self            Home Care and Equipment/Supplies: Were Home Health Services Ordered?: No Any new equipment or medical supplies ordered?: No  Functional Questionnaire: Do you need assistance with bathing/showering or dressing?: No Do you need assistance with meal preparation?: No Do you need assistance with eating?: No Do you have difficulty maintaining continence: No Do you need assistance with getting out of bed/getting out of a chair/moving?: No Do you have difficulty managing or taking your medications?: No  Follow up appointments reviewed: PCP Follow-up appointment confirmed?: Yes Date of PCP follow-up appointment?: 03/15/24 Follow-up Provider: [facility] Follow-up appointment confirmed?: Yes Date of Specialist follow-up appointment?: 03/07/24 Follow-Up Specialty Provider:: [facility] TRANSPLANT MASTER SCHEDULE Do you need transportation to your follow-up appointment?: No Do you understand care options if your condition(s) worsen?: Yes-patient verbalized  understanding  SDOH Interventions Today    Flowsheet Row Most Recent Value  SDOH Interventions   Food Insecurity Interventions Intervention Not Indicated  Housing Interventions Intervention Not Indicated  Transportation Interventions Intervention Not Indicated, Payor Benefit, Patient Resources (Friends/Family)  Utilities Interventions Intervention Not Indicated   03/02/24: Successful outreach with patient post discharge from [facility]. Extra time to reconcile outside medications.  Patient declined follow call needs but plans to continue checking blood pressures and blood sugars and was cleaning out cabinets to start heart Healthy diet.  Stated she understood discharge instructions, medications changes reviewed, was able to obtain medications, and understands follow up appointments and what to contact provider for.     Bing Edison MSN, RN RN Case Manager [place]  [facility] Office Hours [place]-[facility] [phone] Direct Dial : [phone] Main Phone [phone]  Fax: [phone] [email or web address]    "

## 2024-03-07 ENCOUNTER — Ambulatory Visit: Admitting: Family Medicine

## 2024-03-14 NOTE — Progress Notes (Signed)
 Sally Porter                                          MRN: [id number]   03/14/2024   The [facility] Quality Team Specialist reviewed this patient medical record for the purposes of chart review for care gap closure. The following were reviewed: chart review for care gap closure-glycemic status assessment.    [facility] Quality Team

## 2024-03-30 ENCOUNTER — Encounter: Payer: Self-pay | Admitting: *Deleted

## 2024-03-30 NOTE — Progress Notes (Signed)
 Sally Porter                                          MRN: [id number]   03/30/2024   The [facility] Quality Team Specialist reviewed this patient medical record for the purposes of chart review for care gap closure. The following were reviewed: chart review for care gap closure-glycemic status assessment.    [facility] Quality Team

## 2024-05-31 ENCOUNTER — Encounter: Admitting: Family Medicine

## 2025-01-14 ENCOUNTER — Ambulatory Visit
